# Patient Record
Sex: Female | Born: 1935 | Race: Black or African American | Hispanic: No | Marital: Married | Smoking: Former smoker
Health system: Southern US, Community
[De-identification: ages and names within clinical notes are randomized; demographics above are authoritative.]

## PROBLEM LIST (undated history)

## (undated) DIAGNOSIS — M1990 Unspecified osteoarthritis, unspecified site: Secondary | ICD-10-CM

## (undated) DIAGNOSIS — K5790 Diverticulosis of intestine, part unspecified, without perforation or abscess without bleeding: Secondary | ICD-10-CM

## (undated) DIAGNOSIS — K625 Hemorrhage of anus and rectum: Secondary | ICD-10-CM

## (undated) DIAGNOSIS — K5520 Angiodysplasia of colon without hemorrhage: Secondary | ICD-10-CM

## (undated) DIAGNOSIS — K219 Gastro-esophageal reflux disease without esophagitis: Secondary | ICD-10-CM

## (undated) DIAGNOSIS — K5792 Diverticulitis of intestine, part unspecified, without perforation or abscess without bleeding: Secondary | ICD-10-CM

## (undated) DIAGNOSIS — I10 Essential (primary) hypertension: Secondary | ICD-10-CM

## (undated) DIAGNOSIS — S62109A Fracture of unspecified carpal bone, unspecified wrist, initial encounter for closed fracture: Secondary | ICD-10-CM

## (undated) DIAGNOSIS — F32A Depression, unspecified: Secondary | ICD-10-CM

## (undated) DIAGNOSIS — K648 Other hemorrhoids: Secondary | ICD-10-CM

## (undated) DIAGNOSIS — F329 Major depressive disorder, single episode, unspecified: Secondary | ICD-10-CM

## (undated) DIAGNOSIS — J439 Emphysema, unspecified: Secondary | ICD-10-CM

## (undated) DIAGNOSIS — D649 Anemia, unspecified: Secondary | ICD-10-CM

## (undated) DIAGNOSIS — H269 Unspecified cataract: Secondary | ICD-10-CM

## (undated) DIAGNOSIS — F419 Anxiety disorder, unspecified: Secondary | ICD-10-CM

## (undated) HISTORY — DX: Unspecified cataract: H26.9

## (undated) HISTORY — DX: Fracture of unspecified carpal bone, unspecified wrist, initial encounter for closed fracture: S62.109A

## (undated) HISTORY — PX: OTHER SURGICAL HISTORY: SHX169

---

## 1999-06-20 ENCOUNTER — Encounter: Admission: RE | Admit: 1999-06-20 | Discharge: 1999-09-18 | Payer: Self-pay | Admitting: Orthopaedic Surgery

## 2000-08-27 ENCOUNTER — Emergency Department (HOSPITAL_COMMUNITY): Admission: EM | Admit: 2000-08-27 | Discharge: 2000-08-28 | Payer: Self-pay | Admitting: Emergency Medicine

## 2000-09-03 ENCOUNTER — Emergency Department (HOSPITAL_COMMUNITY): Admission: EM | Admit: 2000-09-03 | Discharge: 2000-09-03 | Payer: Self-pay | Admitting: Emergency Medicine

## 2000-09-04 ENCOUNTER — Encounter: Payer: Self-pay | Admitting: General Surgery

## 2000-09-05 ENCOUNTER — Ambulatory Visit (HOSPITAL_COMMUNITY): Admission: RE | Admit: 2000-09-05 | Discharge: 2000-09-06 | Payer: Self-pay | Admitting: General Surgery

## 2003-04-26 ENCOUNTER — Emergency Department (HOSPITAL_COMMUNITY): Admission: EM | Admit: 2003-04-26 | Discharge: 2003-04-26 | Payer: Self-pay | Admitting: Emergency Medicine

## 2003-07-02 ENCOUNTER — Encounter: Payer: Self-pay | Admitting: Orthopedic Surgery

## 2003-11-04 ENCOUNTER — Ambulatory Visit (HOSPITAL_COMMUNITY): Admission: RE | Admit: 2003-11-04 | Discharge: 2003-11-04 | Payer: Self-pay | Admitting: Orthopedic Surgery

## 2003-11-11 ENCOUNTER — Ambulatory Visit: Payer: Self-pay | Admitting: Orthopedic Surgery

## 2003-11-25 ENCOUNTER — Ambulatory Visit: Payer: Self-pay | Admitting: Orthopedic Surgery

## 2003-11-25 ENCOUNTER — Ambulatory Visit (HOSPITAL_COMMUNITY): Admission: RE | Admit: 2003-11-25 | Discharge: 2003-11-25 | Payer: Self-pay | Admitting: Orthopedic Surgery

## 2003-11-30 ENCOUNTER — Ambulatory Visit: Payer: Self-pay | Admitting: Orthopedic Surgery

## 2003-12-08 ENCOUNTER — Encounter: Admission: RE | Admit: 2003-12-08 | Discharge: 2004-02-02 | Payer: Self-pay | Admitting: Orthopedic Surgery

## 2003-12-21 ENCOUNTER — Ambulatory Visit: Payer: Self-pay | Admitting: Orthopedic Surgery

## 2004-01-27 ENCOUNTER — Ambulatory Visit: Payer: Self-pay | Admitting: Orthopedic Surgery

## 2004-02-29 ENCOUNTER — Ambulatory Visit: Payer: Self-pay | Admitting: Orthopedic Surgery

## 2004-04-29 ENCOUNTER — Ambulatory Visit: Payer: Self-pay | Admitting: *Deleted

## 2004-05-04 ENCOUNTER — Ambulatory Visit: Payer: Self-pay | Admitting: Cardiology

## 2004-05-13 ENCOUNTER — Ambulatory Visit: Payer: Self-pay | Admitting: Cardiology

## 2004-06-02 ENCOUNTER — Ambulatory Visit: Payer: Self-pay | Admitting: Orthopedic Surgery

## 2004-06-22 ENCOUNTER — Ambulatory Visit: Payer: Self-pay | Admitting: Orthopedic Surgery

## 2004-08-25 ENCOUNTER — Ambulatory Visit: Payer: Self-pay | Admitting: Orthopedic Surgery

## 2004-09-23 ENCOUNTER — Encounter: Admission: RE | Admit: 2004-09-23 | Discharge: 2004-09-23 | Payer: Self-pay | Admitting: Orthopedic Surgery

## 2004-10-07 ENCOUNTER — Encounter: Admission: RE | Admit: 2004-10-07 | Discharge: 2004-10-07 | Payer: Self-pay | Admitting: Orthopedic Surgery

## 2004-12-07 ENCOUNTER — Ambulatory Visit: Payer: Self-pay | Admitting: Orthopedic Surgery

## 2004-12-27 ENCOUNTER — Encounter: Payer: Self-pay | Admitting: Orthopedic Surgery

## 2004-12-27 ENCOUNTER — Ambulatory Visit: Payer: Self-pay | Admitting: Orthopedic Surgery

## 2004-12-27 ENCOUNTER — Inpatient Hospital Stay (HOSPITAL_COMMUNITY): Admission: RE | Admit: 2004-12-27 | Discharge: 2004-12-30 | Payer: Self-pay | Admitting: Orthopedic Surgery

## 2005-01-11 ENCOUNTER — Ambulatory Visit: Payer: Self-pay | Admitting: Orthopedic Surgery

## 2005-02-08 ENCOUNTER — Ambulatory Visit: Payer: Self-pay | Admitting: Orthopedic Surgery

## 2005-03-09 ENCOUNTER — Ambulatory Visit: Payer: Self-pay | Admitting: Orthopedic Surgery

## 2005-03-29 ENCOUNTER — Ambulatory Visit: Payer: Self-pay | Admitting: Orthopedic Surgery

## 2005-04-26 ENCOUNTER — Ambulatory Visit: Payer: Self-pay | Admitting: Orthopedic Surgery

## 2005-05-12 ENCOUNTER — Encounter: Admission: RE | Admit: 2005-05-12 | Discharge: 2005-05-12 | Payer: Self-pay | Admitting: Orthopedic Surgery

## 2005-05-26 ENCOUNTER — Encounter: Admission: RE | Admit: 2005-05-26 | Discharge: 2005-05-26 | Payer: Self-pay | Admitting: Orthopedic Surgery

## 2005-07-26 ENCOUNTER — Ambulatory Visit: Payer: Self-pay | Admitting: Orthopedic Surgery

## 2005-08-16 ENCOUNTER — Ambulatory Visit: Payer: Self-pay | Admitting: Orthopedic Surgery

## 2007-10-24 ENCOUNTER — Ambulatory Visit (HOSPITAL_COMMUNITY): Admission: RE | Admit: 2007-10-24 | Discharge: 2007-10-24 | Payer: Self-pay | Admitting: Orthopedic Surgery

## 2008-02-19 ENCOUNTER — Inpatient Hospital Stay (HOSPITAL_COMMUNITY): Admission: RE | Admit: 2008-02-19 | Discharge: 2008-02-22 | Payer: Self-pay | Admitting: Orthopedic Surgery

## 2009-04-09 ENCOUNTER — Encounter (HOSPITAL_COMMUNITY): Admission: RE | Admit: 2009-04-09 | Discharge: 2009-05-09 | Payer: Self-pay | Admitting: Orthopedic Surgery

## 2009-09-02 ENCOUNTER — Inpatient Hospital Stay (HOSPITAL_COMMUNITY): Admission: EM | Admit: 2009-09-02 | Discharge: 2009-09-05 | Payer: Self-pay | Admitting: Emergency Medicine

## 2009-09-03 ENCOUNTER — Ambulatory Visit: Payer: Self-pay | Admitting: Gastroenterology

## 2009-09-04 ENCOUNTER — Ambulatory Visit: Payer: Self-pay | Admitting: Gastroenterology

## 2009-09-04 ENCOUNTER — Telehealth: Payer: Self-pay | Admitting: Gastroenterology

## 2009-09-15 ENCOUNTER — Encounter (INDEPENDENT_AMBULATORY_CARE_PROVIDER_SITE_OTHER): Payer: Self-pay | Admitting: *Deleted

## 2009-09-27 ENCOUNTER — Encounter: Payer: Self-pay | Admitting: Gastroenterology

## 2009-10-09 DIAGNOSIS — K5790 Diverticulosis of intestine, part unspecified, without perforation or abscess without bleeding: Secondary | ICD-10-CM | POA: Diagnosis present

## 2009-10-09 HISTORY — PX: COLONOSCOPY: SHX174

## 2009-10-09 HISTORY — DX: Diverticulosis of intestine, part unspecified, without perforation or abscess without bleeding: K57.90

## 2009-10-12 ENCOUNTER — Ambulatory Visit: Payer: Self-pay | Admitting: Gastroenterology

## 2009-10-12 ENCOUNTER — Ambulatory Visit (HOSPITAL_COMMUNITY): Admission: RE | Admit: 2009-10-12 | Discharge: 2009-10-12 | Payer: Self-pay | Admitting: Gastroenterology

## 2009-12-24 ENCOUNTER — Encounter (INDEPENDENT_AMBULATORY_CARE_PROVIDER_SITE_OTHER): Payer: Self-pay | Admitting: *Deleted

## 2010-01-29 ENCOUNTER — Encounter: Payer: Self-pay | Admitting: Orthopedic Surgery

## 2010-02-08 NOTE — Letter (Signed)
Summary: TRIAGE ORDER  TRIAGE ORDER   Imported By: Ave Filter 09/27/2009 10:16:32  _____________________________________________________________________  External Attachment:    Type:   Image     Comment:   External Document  Appended Document: TRIAGE ORDER TRILYTE PREP.

## 2010-02-08 NOTE — Miscellaneous (Signed)
Summary: CONSULTATION  Clinical Lists Changes  NAME:  [name], [name]             ACCOUNT NO.:  [id number]      MEDICAL RECORD NO.:  [id number]          PATIENT TYPE:  INP      LOCATION:  A202                          FACILITY:  [facility]      PHYSICIAN:  Jonette Eva, M.D.     DATE OF BIRTH:  1928/04/29      DATE OF CONSULTATION:  09/03/2009   DATE OF DISCHARGE:                                    CONSULTATION      REASON FOR CONSULTATION:  Diverticulitis, GI bleeding.      HISTORY OF PRESENT ILLNESS:  [name] is a very pleasant 77 year old   African American female who presented to the emergency department   yesterday with several day history of left lower quadrant abdominal pain   followed by maroon stools.  She states that she has chronic constipation   and takes milk of magnesia intermittently.  She thought that her pain   was associated with constipation.  She took a dose of milk of magnesia   on Sunday.  She states she got the urge to have a bowel movement, but   could tell that she had hard stool present.  Therefore, either Sunday or   Monday she did use an enema.  She states that she may have had some   enema trauma as she did feel a little bit of discomfort.  The following   day she noted that she started passing dark red blood per rectum.  She   states there was no stool present.  She has had several episodes of it   over the course of the last 3-4 days.  She finally notified her family   who brought her to the emergency department yesterday.  On rectal exam   in the ER she had dark red blood per rectum.  No other abnormalities   were seen.  She has had several episodes of blood per rectum since her   admission, but her last stool was brown liquidy in nature.      The patient states she had a colonoscopy in 2006 at [facility]  We are requesting records.  According to the daughter she had   diverticulosis.  She states she has had intermittent lower  abdominal   pain since then, but never was treated for diverticulitis.  She notes   her mother does not go to the doctor unless it is absolutely an   emergency.  Other than the chronic constipation and intermittent GERD,   she denies any dysphagia, nausea, vomiting or melena.      In the emergency department her white count was 6900, hemoglobin 12.8,   INR 1, PTT 32, glucose was slightly elevated at 105, otherwise Met-7   normal.  Her LFTs were normal.  This morning her hemoglobin is down to   11.1.  She did have a CT of the abdomen and pelvis without contrast that   showed extensive diverticulosis of the descending and sigmoid colon with   minimal hazy infiltrative changes  of the pericolic fat at the proximal   sigmoid colon felt to be subtle diverticulitis, she had chronic calcific   pancreatitis of the uncinate process of the pancreas.  She had a right   adrenal myelolipoma.  She also had no evidence of perforation or   abscess.  She had a high density material within the cecum in the distal   small bowel of unclear etiology.      MEDICATIONS AT HOME:   1. Oxycodone p.r.n.   2. Aspirin p.r.n.      ALLERGIES:  CODEINE AND ASPIRIN.      PAST MEDICAL HISTORY:   1. Osteoarthritis.   2. GERD.3. Hyperlipidemia, not on any medication.   4. Chronic calcific pancreatitis noted on CT.   5. Chronic constipation.   6. Chronic intermittent lower abdominal pain.      PAST SURGICAL HISTORY:   1. She had a right total knee arthroplasty revision in February 2010.   2. Right total knee replacement December 2006.   3. Right knee arthroscopy in November 2005.   4. She had a diagnostic laparoscopy with adhesiolysis and       cholecystectomy in 2002.   5. Prior tubal ligation.      FAMILY HISTORY:  Negative for chronic GI illnesses or colorectal cancer.      SOCIAL HISTORY:  Married.  Former smoker, quit 3 years ago.  No alcohol   use.  She has eight children.      REVIEW OF SYSTEMS:  See  HPI for GI.  CONSTITUTIONAL:  Denies weight   loss.  CARDIOPULMONARY:  No chest pain, short of breath, palpitations or   cough.  GENITOURINARY:  No dysuria or hematuria.      PHYSICAL EXAMINATION:  VITAL SIGNS:  Temperature 98.2, pulse 83,   respirations 20, blood pressure 135/77.   GENERAL:  Pleasant elderly black female in no acute distress.   SKIN:  Warm and dry.  No jaundice.   HEENT:  Sclerae nonicteric.  Oropharyngeal mucosa moist and pink.   CHEST:  Lungs are clear to auscultation.   CARDIAC:  Regular rate and rhythm.  No murmurs, rubs, or gallops.   ABDOMEN:  Positive bowel sounds.  Abdomen soft.  She has mild to   moderate left lower quadrant tenderness to deep palpation.  No rebound   or guarding.  No organomegaly or masses.  No abdominal bruits or   hernias.   LOWER EXTREMITIES:  No edema.      LABORATORY DATA AND CT:  As outlined above.      IMPRESSION:  The patient is a pleasant 77 year old lady with chronic   constipation and chronic intermittent abdominal pain who presents with 5   history of left lower quadrant abdominal pain followed by hematochezia   after enema use.  She has subtle hazy infiltrative changes of the   pericolic fat at the proximal sigmoid colon.  Radiologist is favoring   subtle diverticulitis.  Would question this is a possibility plus or   minus enema trauma versus mild ischemic colitis versus other.      RECOMMENDATIONS:   1. Agree with antibiotic coverage.   2. Retrieve old colonoscopy report from [facility]       done in 2006.   3. Will monitor for ongoing bleeding.  Stools are going to be sent for       C diff and culture.  Will follow-up on that.   4. If she has persistent  bleeding, would consider flexible       sigmoidoscopy.  Otherwise consider colonoscopy at a later date.      ADDENDUM 16109: TCS IN 4-6 WEEKS, TRILYTE PREP         Tana Coast, P.A.         ______________________________   Jonette Eva, M.D.          LL/MEDQ  D:  09/03/2009  T:  09/03/2009  Job:  [id number]      cc:   Corinna L. Lendell Caprice, MD      Electronically Signed by Tana Coast P.A. on 09/03/2009 02:14:46 PM   Electronically Signed by Jonette Eva M.D. on 09/06/2009 10:02:36 AM

## 2010-02-08 NOTE — Progress Notes (Signed)
Summary: TCS  Pt admitted with abd pain followed by rectal bleeding. Mos likely has ischemic colitis. TCS in one month, TRILYTE prep. West Bali MD  September 04, 2009 2:56 PM  Appended Document: TCS Made note to call pt mid Sept. for tcs.  Appended Document: TCS I called pt to schedule tcs, no answer,lmom.

## 2010-02-10 NOTE — Letter (Signed)
Summary: Recall Office Visit  [facility]  [address]   [place], [place] [address]   Phone: [phone]  Fax: [phone]      December 24, 2009   [name] [address] [place], [place]  [address] June 27, 1928   Dear [name],   According to our records, it is time for you to schedule a follow-up office visit with [place].   At your convenience, please call [phone] to schedule an office visit. If you have any questions, concerns, or feel that this letter is in error, we would appreciate your call.   Sincerely,    Rexene Alberts  [facility] Ph: [phone]   Fax: [phone]

## 2010-03-24 LAB — DIFFERENTIAL
Basophils Absolute: 0 10*3/uL (ref 0.0–0.1)
Basophils Absolute: 0 10*3/uL (ref 0.0–0.1)
Basophils Absolute: 0.1 10*3/uL (ref 0.0–0.1)
Basophils Relative: 0 % (ref 0–1)
Basophils Relative: 1 % (ref 0–1)
Eosinophils Absolute: 0 10*3/uL (ref 0.0–0.7)
Eosinophils Absolute: 0.1 10*3/uL (ref 0.0–0.7)
Eosinophils Absolute: 0.1 10*3/uL (ref 0.0–0.7)
Eosinophils Relative: 1 % (ref 0–5)
Eosinophils Relative: 2 % (ref 0–5)
Lymphocytes Relative: 23 % (ref 12–46)
Lymphs Abs: 1.2 10*3/uL (ref 0.7–4.0)
Lymphs Abs: 1.4 10*3/uL (ref 0.7–4.0)
Monocytes Absolute: 0.5 10*3/uL (ref 0.1–1.0)
Monocytes Absolute: 0.5 10*3/uL (ref 0.1–1.0)
Monocytes Relative: 8 % (ref 3–12)
Neutro Abs: 4.9 10*3/uL (ref 1.7–7.7)
Neutrophils Relative %: 67 % (ref 43–77)

## 2010-03-24 LAB — CLOSTRIDIUM DIFFICILE EIA
C difficile Toxins A+B, EIA: NEGATIVE
C difficile Toxins A+B, EIA: NEGATIVE

## 2010-03-24 LAB — CBC
HCT: 33.3 % — ABNORMAL LOW (ref 36.0–46.0)
Hemoglobin: 11.3 g/dL — ABNORMAL LOW (ref 12.0–15.0)
Hemoglobin: 12.8 g/dL (ref 12.0–15.0)
MCH: 29.8 pg (ref 26.0–34.0)
MCH: 30.1 pg (ref 26.0–34.0)
MCH: 30.4 pg (ref 26.0–34.0)
MCHC: 32.7 g/dL (ref 30.0–36.0)
MCHC: 32.8 g/dL (ref 30.0–36.0)
MCV: 91.1 fL (ref 78.0–100.0)
MCV: 91.9 fL (ref 78.0–100.0)
RBC: 3.79 MIL/uL — ABNORMAL LOW (ref 3.87–5.11)
RBC: 4.2 MIL/uL (ref 3.87–5.11)
RDW: 13.1 % (ref 11.5–15.5)
RDW: 13.2 % (ref 11.5–15.5)
RDW: 13.6 % (ref 11.5–15.5)
WBC: 6.9 10*3/uL (ref 4.0–10.5)
WBC: 6.9 10*3/uL (ref 4.0–10.5)

## 2010-03-24 LAB — BASIC METABOLIC PANEL
BUN: 3 mg/dL — ABNORMAL LOW (ref 6–23)
BUN: 9 mg/dL (ref 6–23)
CO2: 26 mEq/L (ref 19–32)
Calcium: 8.9 mg/dL (ref 8.4–10.5)
Calcium: 9.1 mg/dL (ref 8.4–10.5)
Chloride: 106 mEq/L (ref 96–112)
Creatinine, Ser: 0.77 mg/dL (ref 0.4–1.2)
Creatinine, Ser: 0.77 mg/dL (ref 0.4–1.2)
GFR calc Af Amer: >60 mL/min (ref >60)
GFR calc non Af Amer: >60 mL/min (ref >60)
Glucose, Bld: 101 mg/dL — ABNORMAL HIGH (ref 70–99)
Glucose, Bld: 105 mg/dL — ABNORMAL HIGH (ref 70–99)
Glucose, Bld: 93 mg/dL (ref 70–99)
Potassium: 3.6 mEq/L (ref 3.5–5.1)
Sodium: 140 mEq/L (ref 135–145)

## 2010-03-24 LAB — HEPATIC FUNCTION PANEL
Indirect Bilirubin: 0.5 mg/dL (ref 0.3–0.9)
Total Bilirubin: 0.6 mg/dL (ref 0.3–1.2)
Total Protein: 6.6 g/dL (ref 6.0–8.3)

## 2010-03-24 LAB — TYPE AND SCREEN

## 2010-03-24 LAB — PROTIME-INR: Prothrombin Time: 13.4 seconds (ref 11.6–15.2)

## 2010-03-24 LAB — APTT: aPTT: 32 seconds (ref 24–37)

## 2010-03-24 LAB — STOOL CULTURE

## 2010-04-26 LAB — BASIC METABOLIC PANEL
BUN: 3 mg/dL — ABNORMAL LOW (ref 6–23)
CO2: 24 mEq/L (ref 19–32)
CO2: 27 mEq/L (ref 19–32)
Chloride: 101 mEq/L (ref 96–112)
Chloride: 105 mEq/L (ref 96–112)
Creatinine, Ser: 0.72 mg/dL (ref 0.4–1.2)
Glucose, Bld: 176 mg/dL — ABNORMAL HIGH (ref 70–99)
Potassium: 3.7 mEq/L (ref 3.5–5.1)
Sodium: 132 mEq/L — ABNORMAL LOW (ref 135–145)

## 2010-04-26 LAB — COMPREHENSIVE METABOLIC PANEL
Albumin: 4.3 g/dL (ref 3.5–5.2)
Alkaline Phosphatase: 73 U/L (ref 39–117)
BUN: 8 mg/dL (ref 6–23)
Potassium: 4.3 mEq/L (ref 3.5–5.1)
Sodium: 142 mEq/L (ref 135–145)
Total Protein: 7.3 g/dL (ref 6.0–8.3)

## 2010-04-26 LAB — URINALYSIS, ROUTINE W REFLEX MICROSCOPIC
Bilirubin Urine: NEGATIVE
Ketones, ur: NEGATIVE mg/dL
Nitrite: NEGATIVE
Specific Gravity, Urine: 1.016 (ref 1.005–1.030)
Urobilinogen, UA: 0.2 mg/dL (ref 0.0–1.0)

## 2010-04-26 LAB — CBC
HCT: 32.7 % — ABNORMAL LOW (ref 36.0–46.0)
HCT: 35.4 % — ABNORMAL LOW (ref 36.0–46.0)
HCT: 43.2 % (ref 36.0–46.0)
Hemoglobin: 11.1 g/dL — ABNORMAL LOW (ref 12.0–15.0)
Hemoglobin: 12 g/dL (ref 12.0–15.0)
MCHC: 33.7 g/dL (ref 30.0–36.0)
MCHC: 33.8 g/dL (ref 30.0–36.0)
MCHC: 34.3 g/dL (ref 30.0–36.0)
MCV: 93.2 fL (ref 78.0–100.0)
MCV: 94.2 fL (ref 78.0–100.0)
Platelets: 229 10*3/uL (ref 150–400)
Platelets: 335 10*3/uL (ref 150–400)
RBC: 3.47 MIL/uL — ABNORMAL LOW (ref 3.87–5.11)
RDW: 13.4 % (ref 11.5–15.5)
RDW: 13.6 % (ref 11.5–15.5)
RDW: 13.8 % (ref 11.5–15.5)

## 2010-04-26 LAB — PROTIME-INR
INR: 1 (ref 0.00–1.49)
INR: 2 — ABNORMAL HIGH (ref 0.00–1.49)
Prothrombin Time: 23.8 seconds — ABNORMAL HIGH (ref 11.6–15.2)

## 2010-04-26 LAB — TYPE AND SCREEN: Antibody Screen: NEGATIVE

## 2010-04-26 LAB — APTT: aPTT: 32 seconds (ref 24–37)

## 2010-05-24 NOTE — Op Note (Signed)
NAMEMarland Kitchen  [name], [name]             ACCOUNT NO.:  [id number]   MEDICAL RECORD NO.:  [id number]          PATIENT TYPE:  INP   LOCATION:  0012                         FACILITY:  [facility]   PHYSICIAN:  Ollen Gross, M.D.    DATE OF BIRTH:  May 11, 1928   DATE OF PROCEDURE:  02/19/2008  DATE OF DISCHARGE:                               OPERATIVE REPORT   PREOPERATIVE DIAGNOSIS:  Loose right total knee arthroplasty.   POSTOPERATIVE DIAGNOSIS:  Loose right total knee arthroplasty.   PROCEDURE:  Right total knee arthroplasty revision.   SURGEON:  Dr. Lequita Halt.   ASSISTANT:  Avel Peace, P.A.-C.   ANESTHESIA:  Spinal.   ESTIMATED BLOOD LOSS:  Minimal.   DRAINS:  None.   TOURNIQUET TIME:  Up 35 at 300 mmHg, down 10, and up additional 15 at  300 mmHg.   COMPLICATIONS:  None.   CONDITION:  Stable to recovery.   BRIEF CLINICAL NOTE:  [name] is a 77 year old female WHO had a  right total knee arthroplasty done elsewhere about 2-3 years ago.  She  has had progressively worsening pain and dysfunction in the knee.  She  had an unstable feeling to the knee.  Plain radiographs looked normal,  but we did obtain a bone scan which showed loosening of the prosthesis.  Given her progressively increasing pain and dysfunction, it is decided  to pursue revision surgery.   PROCEDURE IN DETAIL:  After successful administration of spinal  anesthetic, a tourniquet was placed high on the right thigh, and I did  exam under anesthesia.  She had very significant laxity in flexion.  With full extension, she was stable.  We then prepped and draped the  right lower extremity in the normal sterile fashion.  The extremity was  wrapped in Esmarch, knee flexed and tourniquet inflated to 300 mmHg.  Midline incision was made with a 10 blade through the subcutaneous  tissue to the extensor mechanism.  A fresh blade was used make a medial  parapatellar arthrotomy.  I did not encounter any fluid.  Soft tissue  over the proximal medial tibia was then subperiosteally elevated around  the joint line with the knife and into the semimembranosus bursa with a  Cobb elevator.  Soft tissue laterally was elevated with attention being  paid to avoiding the patellar tendon on tibial tubercle.  I was unable  to evert the patella.  I removed scar tissue from the medial and lateral  gutters.  I was then able to subluxed the tibia and then removed the  tibial polyethylene.  This was a 12.5-mm polyethylene thickness.  I then  disrupted the interface between the femoral component bone using  osteotomes and easily removed the femoral component.  Femoral component  was not grossly loose but was not very difficult to remove either.  We  did not sustain any bone loss upon removing it.  I then subluxed tibia  forward and disrupted the interface between the tibial component and  bone anteriorly using an oscillating saw.  I then used an osteotome, and  the entire component flipped out very easily consistent  with a loose  tibial component.  Inspecting posteriorly did show that was subsiding  into the tibia posteriorly.  We then removed the cement from the tibial  canal.   Both canals, tibial and femoral, thoroughly irrigated with saline  solution.  I reamed on the femoral side up to 16 mm for a 60-mm Press-  Fit stem and on the tibial side up to 13 mm for a 13-mm cemented stem.  The tibia was then subluxed forward, and the extramedullary tibial  alignment guide was placed.  We referenced proximally to the medial  aspect of the tibial tubercle and distally along the second metatarsal  axis of the tibial crest.  I removed approximately 2-3 mm to get under  the cement to get to a good bony base.  The resection was made with an  oscillating saw.  Size 2.5 was the most appropriate tibial component.  We then prepared proximally with the modular drill and the drill with  the stem attachment on it to prepare for the MBT  revision tibia.  Once  this was completed, we did our keel punch with a size 3 and then  directed attention to the femoral side.   The femoral canal was again irrigated and a 16-mm reamer placed for  intramedullary cutting guide.  The 5-degree right valgus alignment guide  was placed to remove about 2 mm from the distal femur.  On the medial  side, this was a flush cut.  On the lateral side, we had to go up to the  +4 position in order to get to good bone.  We thus needed a +4 mm distal  augment laterally.  We then placed the AP block in the +2 position to  effectively raise the stem and lower the component to the anterior  flange of the femur.  The anterior and posterior cuts are made.  We  needed a 4-mm augment posterior medial.  The intercondylar blocks were  then placed to make our intercondylar cut.   The trials were placed, size 2.5 MBT revision tibial tray.  On the  femoral side, we used a size 3 and utilized a trial of the size 3 TC3  femur with a 4 mm distal lateral augment, 4 mm posterior medial augment  and a 16 x 75 stem extension in a +2 position.  We had a fairly large  flexion extension gap, and it went over 20 mm with the insert to gain  instability.  I felt that we could do better by building up the joint  line and using a less thick insert.  We thus placed 10-mm augments on  the medial and lateral side of the tibial component.  With this  construct and a 12.5-mm insert, we had great stability achieving full  extension with excellent varus-valgus and anterior-posterior stability  throughout full range of motion.  We then released the tourniquet for  about 8 minutes and then inspected the knee, and there was no active  bleeding points of significance.  Moist sponge was placed while we  assembled the components on the back table.  After approximately 8  minutes, we rewrapped leg in Esmarch.  The components were already  assembled at that point.  I trialed for a cement  restricter in the  tibia, and size 4 was most appropriate.  A size 4 cement restricter was  then placed at the appropriate depth in the tibial canal.  The bone  surfaces were then prepared with  pulsatile lavage.  During this time, I  also did a patellaplasty effectively removing all the soft tissue from  around the patellar component so would be a direct articulation between  the component and the trochlea as opposed to having any tissue  articulating.  The cement was then mixed and once ready for implantation  was injected into the tibial canal and pressurized.  We used 3 g of  vancomycin in total with 3 bags of cement.  The tibia was cemented in  place and impacted.  The femoral side was cemented distally with a Press-  Fit stem.  A 12.5 trial insert was placed, knee held in full extension,  and all extruded cement removed.  When the cement was fully hardened,  then the permanent 12.5-mm rotating platform TC3 insert was placed into  the tibial tray.  This was reduced with excellent stability throughout  full range of motion.  Wound was then copiously irrigated with saline  solution, and the FloSeal injected on the posterior capsule, medial and  lateral gutters and suprapatellar area.  Moist sponge was placed and  tourniquet released for a second tourniquet time approximately 15  minutes.  The sponge was held for 2 minutes and then removed.  There was  minimal bleeding.  The bleeding that was encountered was stopped with  electrocautery.  The wound was then again irrigated and the arthrotomy  closed with interrupted #1 PDS.  Flexion  against gravity was about 135 degrees with excellent stability  throughout full range of motion.  Subcutaneous tissue was closed with  interrupted 2-0 Vicryl and skin with staples.  Incisions cleaned and  dried, and a bulky sterile dressing applied.  She was placed into a knee  immobilizer, awakened, transported to recovery in stable condition.       Ollen Gross, M.D.  Electronically Signed     FA/MEDQ  D:  02/19/2008  T:  02/19/2008  Job:  [id number]

## 2010-05-27 NOTE — H&P (Signed)
[place]  Patient:    [name], [name] Visit Number: [id number] MRN: [id number]          Service Type: Attending:  Jimmye Norman, M.D. Dictated by:   Jimmye Norman, M.D. Adm. Date:  09/05/00   CC:         Tammy R. Collins [place], M.D.   History and Physical  DATE OF BIRTH: 13-Jul-1928  IDENTIFICATION/CHIEF COMPLAINT: The patient is a 77 year old female, with symptomatic cholelithiasis.  HISTORY OF PRESENT ILLNESS: I first saw [name] on September 03, 2000.  At that time she had been suffering from severe right upper quadrant and epigastric pain associated with meals.  This has been going on for the past three to four weeks.  She also has some pain in her lower quadrants and also some that extends down into her lower extremities, with weakness and tingling. This did not appear to be related to her gallbladder; however, possibly secondary to some back disease.  Her epigastric and right upper quadrant pain, however, did seem to be related to her gallbladder.  She had an ultrasound performed at an outside radiology facility which demonstrated difficulty visualizing the gallbladder, which was isoechoic, but gallstones with stones contained within.  A CT scan was recommended; however, this is still pending.  PAST MEDICAL HISTORY: Pretty much unremarkable.  She has had no previous hospitalizations.  PAST SURGICAL HISTORY:  1. Tubal ligation.  2. Laparoscopic adhesiolysis done in 1995.  ALLERGIES: She is intolerance to and has a rash with CODEINE.  MEDICATIONS: None.  REVIEW OF SYSTEMS: She has had no diarrhea, jaundice, constipation.  No fever or chills.  No chest pain or shortness of breath with exertion.  PHYSICAL EXAMINATION:  VITAL SIGNS: She is afebrile.  Other vital signs are stable.  HEENT: Normocephalic, atraumatic.  Anicteric.  NECK: Supple.  CHEST: Clear.  CARDIAC: No murmurs, gallops, lifts, or heaves.  ABDOMEN: Soft,  with mild tenderness in the right upper quadrant and the epigastrium.  PELVIC/RECTAL: Examinations deferred.  LABORATORY DATA: Studies all pending.  Ultrasound report is as mentioned previously, done on August 28, 2000.  IMPRESSION: Symptomatic cholelithiasis with contracted small gallbladder.  PLAN: Perform laparoscopic evaluation and cholecystectomy.  This will be done as an outpatient with overnight stay. Dictated by:   Jimmye Norman, M.D. Attending:  Jimmye Norman, M.D. DD:  09/05/00 TD:  09/05/00 Job: [id number]

## 2010-05-27 NOTE — H&P (Signed)
NAME:  [name], [name] NO.:  [id number]   MEDICAL RECORD NO.:  [id number]          PATIENT TYPE:  OUT   LOCATION:  RAD                           FACILITY:  [facility]   PHYSICIAN:  Vickki Hearing, M.D.DATE OF BIRTH:  1928-04-05   DATE OF ADMISSION:  11/04/2003  DATE OF DISCHARGE:  10/26/2005LH                                HISTORY & PHYSICAL   Followup and pre-op regarding right knee.  Followup after MRI right knee.  I  reviewed the MRI, see below.   CHIEF COMPLAINT:  Right knee pain.   HISTORY AND PHYSICAL IN PREPARATION FOR SURGERY:  This is a 77 year old  female with 3-4 years of right knee pain.  Initially evaluation [employer] with x-rays and MRI.  She had a torn medial meniscus and  osteoarthritis.  She was seen in this office in June 2005.  I agree with the  assessment; however, the patient was not willing to undergo surgery at that  time and wanted to have an injection and that was given.  That lasted her  about 3-4 months, and she continued to have pain with the right knee going  out with increased medial compartment symptoms.  A repeat MRI was done and  it showed that she has a torn medial meniscus, a partial tear of her ACL,  and a possible tear of the lateral meniscus.  She has agreed to undergo  surgery.  We discussed her postoperative course and rehabilitation.  She  agrees and understands the risks and benefits of the procedure.   REVIEW OF SYSTEMS:  Indicates she has had a history of reflux, unsteady  gait, joint pain, otherwise her systems are normal.   She is allergic to CODEINE.   PAST MEDICAL HISTORY:  Includes no major medical problems.   She did have a cholecystectomy.   Her medications are filled at [facility] in [place].  She is on no medications.  She is healthy.   FAMILY HISTORY:  Asthma and cancer.   FAMILY PHYSICIAN:  Dr. Dewaine Conger.   SOCIAL HISTORY:  She is married.  She is a housewife.  She does smoke a pack  of  cigarettes every 1-2 days.  She reports no alcohol use.  Caffeine use  reported yes.  Grade level completed 1-8.   PHYSICAL EXAMINATION:  VITAL SIGNS:  Weight 140, pulse 78, respiratory rate  20.  GENERAL APPEARANCE:  Normal.  CARDIOVASCULAR:  No swelling, no varicose veins, pulses are intact.  EXTREMITIES:  Temperature of extremities normal.  No edema or tenderness.  LYMPH NODES:  Normal.  MUSCULOSKELETAL:  Shows an antalgic gait on the right leg.  Her right knee  has normal alignment.  There is medial compartment tenderness.  The range of  motion was full.  The stability was normal.  The strength and muscle tone  were intact.  Muscle strength grade 5/5.  Meniscal signs were positive.  We  did a screw-home test and McMurray maneuver.  The Apley test was also  positive.  SKIN:  Normal in all four extremities.  NEURO/PSYCH:  Showed normal coordination, reflexes, and  sensation.  Reflexes  were equal right to left.  She was oriented to person, place and time.  Her  mood and affect were normal.   Her MRI was reviewed and shows a torn medial meniscal tear, ACL partial  tear, and lateral meniscal free edge tear.   IMPRESSION:  1.  Medial meniscal tear.  2.  Anterior cruciate ligament tear, partial.  3.  Possible lateral meniscal degenerative tear.   PLAN:  Arthroscopy, right knee.     Weyman Croon   SEH/MEDQ  D:  11/11/2003  T:  11/11/2003  Job:  [id number]   cc:   [facility]  Fax: [phone]   Vickki Hearing, M.D.  Fax: [phone]

## 2010-05-27 NOTE — Op Note (Signed)
NAME:  [name], [name]             ACCOUNT NO.:  [id number]   MEDICAL RECORD NO.:  [id number]          PATIENT TYPE:  INP   LOCATION:  A340                          FACILITY:  [facility]   PHYSICIAN:  Vickki Hearing, M.D.DATE OF BIRTH:  [birth year]   DATE OF PROCEDURE:  12/27/2004  DATE OF DISCHARGE:                                 OPERATIVE REPORT   PREOPERATIVE DIAGNOSIS:  Osteoarthritis, right knee.   POSTOPERATIVE DIAGNOSIS:  Osteoarthritis, right knee.   PROCEDURE:  Right total knee replacement.   IMPLANTS USED:  DePuy rotating platform size 3 femur, 2.5 tibia, 32 patella  and a 12.5 insert.   OPERATIVE FINDINGS:  Grade 4 lesion medial femoral condyle; grade 2 lesion  lateral femoral condyle, grade 1 lesion patellofemoral joint including  trochlea.   SURGEON:  Dr. Romeo Apple.   ANESTHESIA:  Anesthetic was by spinal.   SPECIMENS:  Specimens included bone fragments.   DRAINS:  One drain was in the knee joint. One pain pump catheter was in the  subcu.   The patient was identified as [name]. Her right leg was marked for  surgery, countersigned by the surgeon. History and physical were updated.  Antibiotics were started. She was taken to the operating for spinal  anesthetic.   After spinal anesthetic, Foley catheter was in place. Right leg was prepped  and draped using sterile technique. Time-out was completed as required.  Right lower extremity was then exsanguinated with a 6-inch Esmarch.  Tourniquet was elevated.   Straight midline incision was used. Medial arthrotomy was performed. Medial  soft tissue sleeve was elevated. The tibia was subluxated forward. Neutral  cut was made for the tibia, resecting 8 mm of bone from the normal side.   Femur was entered with a 3/8-inch drill bit. Femur was decompressed with  suction irrigation. Distal femoral cut was made referencing the most  prominent condyle, resecting 10 mm of bone. Extension gap was checked. We  were  able to get a 12.5 spacer in. We then turned our attention to the 4 in  and 1 cutting block. It was applied to the distal femur. Four cuts were  made. Flexion gap was checked. That match our extension gap, and we  proceeded to make the box cut. We did a trial reduction. Trial reduction was  good with good stability with a 12.5 insert. Excellent range of motion was  noted.   Of note, soft tissue resections included ACL, PCL, medial and lateral  menisci.   Patella was cut from a 26 down to a 15 with three pegs drilled through 32-mm  button.   We irrigated the bone, dried all the bones, cemented the implants in place,  allowed them to cure and closed by placing 30 cc of Marcaine with  epinephrine in the soft tissues around the intra-articular portion of the  joint. Extensor mechanism was closed over a Hemovac drain with #1 Bralon  interrupted in running fashion and closed the subcu with 0-0 and 2-0  Monocryl over a pain pump catheter, placed staples in the skin, applied  dressings, CryoCuff, Ace bandages. The patient  was taken to recovery room in  stable condition. Postoperative, total knee protocol as routine.      Vickki Hearing, M.D.  Electronically Signed     SEH/MEDQ  D:  12/28/2004  T:  12/29/2004  Job:  [id number]

## 2010-05-27 NOTE — Discharge Summary (Signed)
NAMEMarland Kitchen  [name], [name]             ACCOUNT NO.:  [id number]   MEDICAL RECORD NO.:  [id number]          PATIENT TYPE:  INP   LOCATION:  1615                         FACILITY:  [facility]   PHYSICIAN:  Ollen Gross, M.D.    DATE OF BIRTH:  10-24-1928   DATE OF ADMISSION:  02/19/2008  DATE OF DISCHARGE:  02/22/2008                               DISCHARGE SUMMARY   ADMISSION DIAGNOSES:  1. Painful right total knee.  2. Vertigo.  3. Emphysema.  4. Hypercholesterolemia.  5. Reflux disease.   DISCHARGE DIAGNOSES:  1. Loose right total knee arthroplasty, status post right total knee      arthroplasty revision.  2. Mild postoperative blood loss anemia.  3. Mild postoperative hyponatremia.  4. Vertigo.  5. Emphysema.  6. Hypercholesterolemia.  7. Reflux disease.   PROCEDURE:  On 02/19/08, right total knee arthroplasty revision.   SURGEON:  Ollen Gross, M.D.   ASSISTANT:  Alexzandrew L. Perkins, P.A.-C.   ANESTHESIA:  Spinal.   CONSULTATIONS:  None.   BRIEF HISTORY:  [name] is a 77 year old female who underwent a  right total knee arthroplasty elsewhere about two or three years ago,  with progressive worsening pain and dysfunction with continued pain.  X-  rays looked normal.  A bone scan showed loosening and she was felt to  benefit from undergoing a revision.   LABORATORY DATA:  Preop CBC showed a hemoglobin of 14.3, hematocrit  43.2, white cell count 5.8, platelets 335.  Postoperative hemoglobin 12,  then down to 11.  Last hemoglobin and hematocrit stabilized with a  hemoglobin of 11.1 and hematocrit of 32.3.  PT 13.4, PTT 32.  INR 1.0.  Serial pro-times followed per the Coumadin protocol.  Last PT and INR  23.9 and 2.0 respectively.  A chemistry panel on admission all within  normal limits.  Serial BMETs were followed.  Sodium did drop from 142 to  132, back up to 137.  Glucose went from 93 to 176 and back down to 111.  Preoperative urinalysis with moderate leukocyte  esterase and rare  epithelial cells, rare bacteria.  Blood type O-positive.   A chest x-ray dated 05/16/07, revealed chronic obstructive pulmonary  disease and emphysema.  No acute cardiopulmonary process.   She has an electrocardiogram on the chart dated 05/16/07, showing sinus  rhythm, T-wave abnormality in the high lateral leads, prolonged QT  interval, unconfirmed.   HOSPITAL COURSE:  The patient admitted to [facility] and taken  to the operating room and underwent the above-stated procedure without  complications.  The patient tolerated the procedure well.  The patient  was started on PCA for pain control.  Received 24 hours postop IV  antibiotics.  Had a low sodium postop on day one.  Hemoglobin was fine.  Doing pretty well for a full revision.  Started on meds.  Felt to be a  delusional component with hyponatremia.  Decreased the fluids.  Her  output was good.  By day two the sodium was already back up.  The  hyponatremia improved.  She started getting up out of bed and walking  short distances.  She got about 25 feet.  Dressing change and the  incision looked excellent.  Continue to progress well and by day three,  walking 100 feet.  Tolerating medications.   DISPOSITION:  Was discharged home later that day.   DISCHARGE PLAN:  Discharged home on 02/22/08.   DISCHARGE MEDICATIONS:  1. Percocet.  2. Robaxin.  3. Coumadin.   DISCHARGE DIET:  Low-cholesterol diet.   DISCHARGE ACTIVITIES:  As tolerated.  Total knee protocol.  Home PT and  nursing.   FOLLOWUP:  Follow up in two weeks in the office.   CONDITION ON DISCHARGE:  Improved.      Alexzandrew L. Perkins, P.A.C.      Ollen Gross, M.D.  Electronically Signed    ALP/MEDQ  D:  04/02/2008  T:  04/02/2008  Job:  [id number]   cc:   Dr. Mamie Laurel

## 2010-05-27 NOTE — Op Note (Signed)
NAME:  [name], [name]             ACCOUNT NO.:  [id number]   MEDICAL RECORD NO.:  [id number]          PATIENT TYPE:  AMB   LOCATION:  DAY                           FACILITY:  [facility]   PHYSICIAN:  Vickki Hearing, M.D.DATE OF BIRTH:  Sep 28, 1928   DATE OF PROCEDURE:  11/25/2003  DATE OF DISCHARGE:                                 OPERATIVE REPORT   PREOPERATIVE DIAGNOSIS:  Torn medial meniscus, torn lateral meniscus, torn  anterior cruciate ligament, and osteoarthritis, right knee.   POSTOPERATIVE DIAGNOSIS:  Torn medial and lateral meniscus, osteoarthritis  of the right knee.   PROCEDURE:  Arthroscopy, medial and lateral meniscectomies, and  chondroplasty, medial femoral condyle and lateral femoral condyle.   OPERATIVE FINDINGS:  Grade 3 cartilage changes on the lateral and medial  femoral condyles.  Posterior horn medial meniscal tear.  Degenerative  fraying of the free edge of the lateral meniscus.   COMPLICATIONS:  None.   SPECIMENS:  None.   COUNTS:  Correct.   ANESTHESIA:  Spinal.   SURGEON:  Vickki Hearing, M.D.   ASSISTANT:  None.   SPECIMENS:  None.   DRAINS:  None.   HISTORY:  This is a 77 year old female with a long history of right knee  pain, who presented to the office with torn medial meniscus on MRI.  In  addition, she had lateral meniscal tear and possible ACL tear documented by  MRI.  When I initially saw her, she did not want to have surgery.  She tried  to put up with the symptoms.  However, the pain increased, her activities of  daily living became more difficult, and she presented for a right knee  arthroscopy.  Primary indication of pain.   PROCEDURE:  The patient was identified as [name] in the preop  holding area.  She marked her right knee as the surgical site, at which time  I then signed my initials on the surgical site as the right knee as well.  We reviewed her medical record and consent, and they confirmed that the  right knee was the surgical site.   She was to have right knee arthroscopy.  She had a gram of Ancef and was  taken to the operating room for a spinal anesthetic.  She was placed supine  on the operating table.  Her right knee was prepped and draped using sterile  technique.  We then took a mandatory time-out as required to confirm the  procedure, the patient, and the proper limb as the right lower extremity.  Everyone in the OR agreed.  We confirmed her antibiotics have been given  within an hour of the skin incision, all equipment was present and  functioning.   A two-incision diagnostic arthroscopy was performed starting in the medial  compartment.  We progressed around the notch, the lateral compartment, and  the patellofemoral compartment.   In the medial compartment there were grade 3 changes of the medial femoral  condyle.  There was a torn posterior horn medial meniscus.  The ACL was  intact.  There was fraying and tearing of the free  edge of the lateral  meniscus and grade 3 changes there as well.  The patellofemoral joint was  normal.   Using the medial portal as a working portal, a duckbill forceps was used to  trim the meniscal tear on the medial side.  A chondroplasty was performed.  The fragments and debris were suctioned out of the joint using a shaver.  I  then did a similar procedure on the lateral side and irrigated the knee,  suctioned the debris, closed the portals with Steri-Strips, injected 30 mL  of Marcaine, and put sterile dressings on the knee, a CryoCuff.  The patient  was taken to the recovery room in stable condition.  Discharged home on  Darvocet, full weightbearing, follow-up in two days.     [name]   SEH/MEDQ  D:  11/25/2003  T:  11/25/2003  Job:  [id number]

## 2010-05-27 NOTE — H&P (Signed)
NAME:  [name], [name]             ACCOUNT NO.:  [id number]   MEDICAL RECORD NO.:  [id number]          PATIENT TYPE:  AMB   LOCATION:  DAY                           FACILITY:  [facility]   PHYSICIAN:  Vickki Hearing, M.D.DATE OF BIRTH:  August 30, 1928   DATE OF ADMISSION:  12/26/2004  DATE OF DISCHARGE:  LH                                HISTORY & PHYSICAL   CHIEF COMPLAINT:  Right knee pain.   HISTORY OF PRESENT ILLNESS:  This is a 77 year old female who is status post  arthroscopy, right knee, at which time we found osteoarthritis and torn  meniscus.  She did not recover well after her surgery and continued to have  pain.  She was worked up for neurogenic pain and also had an MRI done of her  spine.  Spine MRI showed foraminal encroachment on the right at L4 and L5.  She did have epidural steroid series with only minimal improvement and  continues to have right knee pain and presents for a right total knee.   REVIEW OF SYSTEMS:  GASTROINTESTINAL:  History of reflux.  MUSCULOSKELETAL:  Joint pain.  Otherwise normal.   ALLERGIES:  CODEINE.   PAST MEDICAL HISTORY:  No major medical problems.   PAST SURGICAL HISTORY:  1.  Cholecystectomy.  2.  Knee arthroscopy.   CURRENT MEDICATIONS:  No chronic medications.  She is healthy.   FAMILY HISTORY:  Asthma and cancer.   PRIMARY CARE PHYSICIAN:  Dr. Dewaine Conger.   SOCIAL HISTORY:  She is married.  She is a housewife.  She does smoke one to  two cigarettes every day.  No alcohol use.  She has reported some caffeine  use and she completed grade levels 1-8.   PHYSICAL EXAMINATION:  VITAL SIGNS:  Weight is approximately 140 pounds,  pulse 80, respirations 18.  GENERAL:  Normal.  CARDIAC:  No swelling, varicose veins and impulses are normal.  EXTREMITIES:  Warm to touch with no edema or tenderness.  LYMPH NODES:  Normal.  SKIN:  Normal on all four extremities.  NEUROLOGIC:  She is awake, alert and oriented with normal coordination,  reflexes  and sensation.  EXTREMITIES:  She has a painful range of motion throughout the range of  motion arc.  She does have near full range of motion in the knee.  There are  no major contractures.  Muscle tone and strength are good.  The knee is  stable.  Primary pain was in the lateral compartment.   ASSESSMENT:  Osteoarthritis, right knee.   PLAN:  Right total knee.      Vickki Hearing, M.D.  Electronically Signed     SEH/MEDQ  D:  12/26/2004  T:  12/26/2004  Job:  [id number]

## 2010-05-27 NOTE — Discharge Summary (Signed)
NAME:  [name], [name]             ACCOUNT NO.:  [id number]   MEDICAL RECORD NO.:  [id number]          PATIENT TYPE:  INP   LOCATION:  A340                          FACILITY:  [facility]   PHYSICIAN:  Vickki Hearing, M.D.DATE OF BIRTH:  29-Aug-1928   DATE OF ADMISSION:  12/27/2004  DATE OF DISCHARGE:  12/22/2006LH                                 DISCHARGE SUMMARY   ADMISSION DIAGNOSIS:  Osteoarthritis right knee.   DISCHARGE DIAGNOSIS:  Osteoarthritis right knee.   PROCEDURES:  Right total knee implant, DePuy rotating platform.   SURGEON:  Vickki Hearing, M.D.   HOSPITAL COURSE:  The patient was admitted on the day stated, had an  uncomplicated knee surgery with a right total knee replacement.  She did  well except for some late night confusion.  On the day of discharge, she had  a low-grade temperature of 99.1.  Her wound looked fine with no erythema.  Her vital signs were stable.  She was awake, alert and oriented x3.  I spoke  with her daughter and with the patient, and they both agreed the patient was  ready to go home.  She was ambulatory 100 feet, had range of motion 0-90.   She was discharged on routine medications.  In addition, she will take one  Vicodin as needed for pain.  She is on Lovenox 30 mg b.i.d. x10 days  followed by Ecotrin, one b.i.d. x4 weeks after the Lovenox is stopped.  She  is weightbearing as tolerated.  She has physical therapy and CPM set up for  home.  She will follow up with me on January 3 at 9:30.  We will take the  staples out if they are ready at that time.      Vickki Hearing, M.D.  Electronically Signed     SEH/MEDQ  D:  12/30/2004  T:  12/30/2004  Job:  [id number]

## 2010-05-27 NOTE — Op Note (Signed)
[place]. [facility]  Patient:    [name], [name] Visit Number: [id number] MRN: [id number]          Service Type: DSU Location: [phone] Attending Physician:  Cherylynn Ridges Proc. Date: 09/05/00 Adm. Date:  09/05/2000                             Operative Report  PREOPERATIVE DIAGNOSIS:  Symptomatic cholelithiasis based on ultrasound report.  POSTOPERATIVE DIAGNOSIS:  Status post surgical removal of gallbladder with adhesions in gallbladder fossa and pelvic adhesions.  PROCEDURE:  Diagnostic laparoscopy with adhesiolysis.  SURGEON:  Jimmye Norman, M.D.  ASSISTANT:  Ollen Gross. Vernell Morgans, M.D.  ANESTHESIA:  General endotracheal.  ESTIMATED BLOOD LOSS:  Less than 10 cc.  COMPLICATIONS:  None.  CONDITION:  Stable.  INDICATIONS FOR PROCEDURE:  The patient is a 77 year old female with severe right upper quadrant and epigastric abdominal pain.  Sent to me with ultrasound demonstrating what was thought to be an isoechoic gallbladder with stones contained within.  The patient reported no previous history of surgical removal of the gallbladder.  However, she did report that she had had laparoscopic adhesions removed and previous tubal ligation.  FINDINGS:  The patient had obvious previous removal of the gallbladder with adhesions in the gallbladder fossa, and old surgical clips secondary to that. We quickly realized that there is no further dissection in the gallbladder area.  We removed some adhesions in that area along with the gallbladder and also in the pelvis.  DESCRIPTION OF PROCEDURE:  The patient was taken to the operating room and placed on the table in the supine position.  After an adequate general anesthetic was administered, she was prepped and draped in the usual sterile manner.  A #11 blade was used to make a supraumbilical vertical incision down to the midline fascia which was subsequently grasped with Kocher clamps.  We made a  fascial incision down into the peritoneum and then placed a pursestring suture of 0 Vicryl around this incision.  We subsequently used the Hasson cannula to pass into the peritoneal cavity, and once it was in, we insufflated with carbon dioxide gas up to maximum intra-abdominal pressure of 15 mmHg.  Subsequently, two right costal margin 5 mm cannulas and a subxiphoid 10-11 mm cannula passed under direct vision.  The patient was placed in the reversed Trendelenburg position.  The left side was tilted down.  We began to look for the gallbladder and perform the dissection.  Immediately upon looking at the gallbladder fossa, there was a large amount of omental adhesions which were thought to be secondary to previous inflammation of the gallbladder with adhesions on the omentum to that.  However, as we dissected down, the omentum could be removed, and we subsequently were able to see surgical clips from the previous surgical procedure.  We _______ quickly that the patient had had a previous removal of the gallbladder in spite of her lack of memory of such procedure, and also in spite of not having any very obvious surgical scars upon looking at the abdomen under the direct light of the OR, very faint light and scars from the previous operation could vaguely be made out.  However, it was not very obvious with the exception of a periumbilical incision, that she has had a previous removal of the gallbladder.  We took down adhesions around the gallbladder fossa.  Could see the fossa itself well,  but did not dissect down toward the hymen.  There were also some adhesions of colon to the anterior abdominal wall in the pelvis which were taken down, and this appeared to be secondary to a previous incision in her lower pelvis.  There was no injury to any bowel during this procedure.  We subsequently removed all cannulas.  With all cannulas removed, the gas was removed.  We closed the  supraumbilical fascia using 0 Vicryl suture.  Then, 0.25% Marcaine was injected at all sites, and then we closed the skin using running subcuticular stitches at all sites. Quarter percent Marcaine with epinephrine was injected at all sites.  Sterile dressings were applied. Attending Physician:  Cherylynn Ridges DD:  09/05/00 TD:  09/05/00 Job: [id number]

## 2010-05-27 NOTE — H&P (Signed)
NAMEMarland Kitchen  Amy, Shaffer             ACCOUNT NO.:  [id number]   MEDICAL RECORD NO.:  [id number]          PATIENT TYPE:  INP   LOCATION:  1615                         FACILITY:  [facility]   PHYSICIAN:  Ollen Gross, M.D.    DATE OF BIRTH:  March 25, 1928   DATE OF ADMISSION:  02/26/2008  DATE OF DISCHARGE:                              HISTORY & PHYSICAL   CHIEF COMPLAINT:  Right knee pain.   HISTORY OF PRESENT ILLNESS:  The patient is a 77 year old female who has  been seen by Dr. Lequita Halt for right knee pain.  She had a previous total  knee.  It is felt to be loosening and causing pain.  She had a bone scan  done which showed some increased uptake more indicative of a loosening  prosthesis.  She has activity pain and felt that a revision would be  most helpful in alleviating her pain.  Risks and benefits have been  discussed.  She elects to proceed with surgery.   ALLERGIES:  CODEINE causes breakout rash and itching.   CURRENT MEDICATIONS:  1. Hydrocodone (she is able to take hydrocodone and oxycodone).  2. Meclizine.  3. Cholesterol pill.  4. Tums.   PAST MEDICAL HISTORY:  1. Vertigo.  2. Emphysema.  3. Hypercholesterolemia.  4. Reflux disease.   PAST SURGICAL HISTORY:  Right knee replacement in 2006.   FAMILY HISTORY:  Mother with history of heart attack and asthma.  Brother with heart attack and also another brother with heart attack.   SOCIAL HISTORY:  Married, past smoker.  No alcohol.  Eight children.  Husband will be assisting with care after surgery.  She lives in a one-  story home with one step.  Living will.   REVIEW OF SYSTEMS:  GENERAL:  No fevers, chills, night sweats.  NEURO:  Little bit of vertigo and also  tinnitus.  No seizures or syncope.  RESPIRATORY:  No productive cough or hemoptysis.  CARDIOVASCULAR:  No  chest pain, angina, orthopnea.  GI:  No nausea, vomiting, diarrhea or  constipation.  GU:  No dysuria, hematuria or discharge.  MUSCULOSKELETAL:  Knee  pain.   PHYSICAL EXAMINATION:  VITAL SIGNS:  Pulse 76, respirations 14, blood  pressure 120/80.  GENERAL:  A 77 year old female well-nourished, well-developed in no  acute distress.  She is alert, oriented and cooperative.  HEENT:  Normocephalic, atraumatic.  Pupils are round and reactive.  EOMs  intact.  Does have full upper denture plate.  NECK:  Supple.  CHEST:  Clear with the exception of some crackles at the bases, left  greater than the right.  HEART:  Regular rate and rhythm with occasional skipped ectopic beat.  ABDOMEN:  Soft, slightly round.  Bowel sounds are present.  BREASTS/GENITALIA/RECTAL:  Not done and not pertinent to the present  illness.  EXTREMITIES:  Right knee, no effusion.  No warmth.  Range of motion 5-  125, some slight laxity.   IMPRESSION:  Right total knee arthroplasty.   PLAN:  The patient will be admitted for surgery to undergo a revision of  right total knee arthroplasty.  Surgery  will be performed by Dr. Ollen Gross.      Alexzandrew L. Perkins, P.A.C.      Ollen Gross, M.D.  Electronically Signed    ALP/MEDQ  D:  03/05/2008  T:  03/05/2008  Job:  [id number]   cc:   Ollen Gross, M.D.  Fax: [phone]   Bevelyn Buckles, MD

## 2010-08-11 DIAGNOSIS — R0602 Shortness of breath: Secondary | ICD-10-CM

## 2011-08-27 ENCOUNTER — Emergency Department (HOSPITAL_COMMUNITY)
Admission: EM | Admit: 2011-08-27 | Discharge: 2011-08-27 | Disposition: A | Discharge status: Home / Self Care | Payer: Medicare Other | Attending: Emergency Medicine | Admitting: Emergency Medicine

## 2011-08-27 ENCOUNTER — Emergency Department (HOSPITAL_COMMUNITY): Payer: Medicare Other

## 2011-08-27 ENCOUNTER — Encounter (HOSPITAL_COMMUNITY): Payer: Self-pay | Admitting: *Deleted

## 2011-08-27 DIAGNOSIS — Z79899 Other long term (current) drug therapy: Secondary | ICD-10-CM | POA: Insufficient documentation

## 2011-08-27 DIAGNOSIS — M25559 Pain in unspecified hip: Secondary | ICD-10-CM | POA: Insufficient documentation

## 2011-08-27 DIAGNOSIS — M25561 Pain in right knee: Secondary | ICD-10-CM

## 2011-08-27 DIAGNOSIS — R109 Unspecified abdominal pain: Secondary | ICD-10-CM | POA: Insufficient documentation

## 2011-08-27 DIAGNOSIS — M549 Dorsalgia, unspecified: Secondary | ICD-10-CM | POA: Insufficient documentation

## 2011-08-27 DIAGNOSIS — N390 Urinary tract infection, site not specified: Secondary | ICD-10-CM | POA: Insufficient documentation

## 2011-08-27 DIAGNOSIS — R531 Weakness: Secondary | ICD-10-CM

## 2011-08-27 DIAGNOSIS — M25569 Pain in unspecified knee: Secondary | ICD-10-CM | POA: Insufficient documentation

## 2011-08-27 DIAGNOSIS — R5381 Other malaise: Secondary | ICD-10-CM | POA: Insufficient documentation

## 2011-08-27 LAB — URINALYSIS, ROUTINE W REFLEX MICROSCOPIC
Bilirubin Urine: NEGATIVE
Glucose, UA: NEGATIVE mg/dL
Ketones, ur: 15 mg/dL — AB
Protein, ur: NEGATIVE mg/dL
Urobilinogen, UA: 0.2 mg/dL (ref 0.0–1.0)

## 2011-08-27 LAB — COMPREHENSIVE METABOLIC PANEL
AST: 25 U/L (ref 0–37)
Albumin: 4.3 g/dL (ref 3.5–5.2)
Alkaline Phosphatase: 83 U/L (ref 39–117)
CO2: 25 mEq/L (ref 19–32)
Chloride: 101 mEq/L (ref 96–112)
Creatinine, Ser: 0.84 mg/dL (ref 0.50–1.10)
GFR calc non Af Amer: 63 mL/min — ABNORMAL LOW (ref >90)
Potassium: 4.3 mEq/L (ref 3.5–5.1)
Total Bilirubin: 0.5 mg/dL (ref 0.3–1.2)

## 2011-08-27 LAB — CBC WITH DIFFERENTIAL/PLATELET
Basophils Absolute: 0 10*3/uL (ref 0.0–0.1)
Basophils Relative: 0 % (ref 0–1)
HCT: 45.3 % (ref 36.0–46.0)
Hemoglobin: 15.4 g/dL — ABNORMAL HIGH (ref 12.0–15.0)
Lymphocytes Relative: 15 % (ref 12–46)
Monocytes Absolute: 1 10*3/uL (ref 0.1–1.0)
Monocytes Relative: 10 % (ref 3–12)
Neutro Abs: 7.5 10*3/uL (ref 1.7–7.7)
Neutrophils Relative %: 75 % (ref 43–77)
RDW: 13 % (ref 11.5–15.5)
WBC: 10 10*3/uL (ref 4.0–10.5)

## 2011-08-27 MED ORDER — HYDROCODONE-ACETAMINOPHEN 5-500 MG PO TABS: 1.0000 | ORAL_TABLET | Freq: Four times a day (QID) | ORAL | Status: AC | PRN

## 2011-08-27 MED ORDER — CEPHALEXIN 500 MG PO CAPS: 500.0000 mg | ORAL_CAPSULE | Freq: Four times a day (QID) | ORAL | Status: AC

## 2011-08-27 MED ORDER — IBUPROFEN 600 MG PO TABS: 600.0000 mg | ORAL_TABLET | Freq: Four times a day (QID) | ORAL | Status: AC | PRN

## 2011-08-27 MED ORDER — OXYCODONE-ACETAMINOPHEN 5-325 MG PO TABS
1.0000 | ORAL_TABLET | Freq: Once | ORAL | Status: AC
  Administered 2011-08-27: 1 via ORAL
  Filled 2011-08-27: qty 1

## 2011-08-27 NOTE — ED Notes (Signed)
Pt reports several month hx of right knee pain that she states is now going to right thigh, across lower abdomen, right shoulder, and right neck. Pt states just goes everywhere. Pt denies any specific injury, states she just can't take it anymore. Pt with multiple complaints that are continued to be list as triage continues. No complaints are new states all have been happening just are getting worse.

## 2011-08-27 NOTE — ED Provider Notes (Signed)
History     CSN: [id number]  Arrival date & time 08/27/11  1120   First MD Initiated Contact with Patient 08/27/11 1228      Chief Complaint  Patient presents with  . Knee Pain  . Back Pain  . Abdominal Pain    (Consider location/radiation/quality/duration/timing/severity/associated sxs/prior treatment) Patient is a 78 y.o. female presenting with knee pain and abdominal pain. The history is provided by the patient.  Knee Pain This is a chronic problem. The current episode started more than 1 year ago. The problem occurs constantly. The problem has been gradually worsening. Associated symptoms include abdominal pain, fatigue and weakness. Pertinent negatives include no anorexia, chills, fever, nausea, neck pain, numbness, urinary symptoms or vomiting. The symptoms are aggravated by bending and walking. She has tried NSAIDs for the symptoms. The treatment provided no relief.  Abdominal Pain The primary symptoms of the illness include abdominal pain and fatigue. The primary symptoms of the illness do not include fever, shortness of breath, nausea, vomiting, diarrhea or dysuria. The current episode started more than 2 days ago. The onset of the illness was gradual. The problem has been gradually worsening.  Additional symptoms associated with the illness include back pain. Symptoms associated with the illness do not include chills, anorexia, constipation, urgency, hematuria or frequency.  PT states she has had problems with right knee for several years since her replacement and revision. Pt states pain is just getting wore. Pain radiating into right thigh, right hip, right lower abdomen, right shoulder. Pt did fall 3 weeks ago, has not seen anyone at that time. Per family, pt not walking much for the last 2 weeks, unable to even get out of bed. Pt now also reports lower back pain all the way across, also lower abdominal pain. No fever, chills, urinary symptoms. Pt was seen by her PCP 1 mon ago,  was given mobic, pt states pain not improving. Pt states "I just cant take it anymore."   No past medical history on file.  No past surgical history on file.  No family history on file.  History  Substance Use Topics  . Smoking status: Never Smoker   . Smokeless tobacco: Not on file  . Alcohol Use: No    OB History    Grav Para Term Preterm Abortions TAB SAB Ect Mult Living                  Review of Systems  Constitutional: Positive for fatigue. Negative for fever and chills.  HENT: Negative for neck pain and neck stiffness.   Respiratory: Negative for chest tightness and shortness of breath.   Gastrointestinal: Positive for abdominal pain. Negative for nausea, vomiting, diarrhea, constipation and anorexia.  Genitourinary: Negative for dysuria, urgency, frequency and hematuria.  Musculoskeletal: Positive for back pain.  Skin: Negative.   Neurological: Positive for weakness. Negative for dizziness and numbness.    Allergies  Codeine  Home Medications   Current Outpatient Rx  Name Route Sig Dispense Refill  . ACETAMINOPHEN 500 MG PO TABS Oral Take 500 mg by mouth every 6 (six) hours as needed. For pain    . DULOXETINE HCL 30 MG PO CPEP Oral Take 30 mg by mouth daily.    Marland Kitchen MECLIZINE HCL 25 MG PO TABS Oral Take 25 mg by mouth 2 (two) times daily.    . MELOXICAM 7.5 MG PO TABS Oral Take 7.5 mg by mouth daily.      BP 127/81  Pulse 111  Temp 98.7 F (37.1 C) (Oral)  Resp 16  SpO2 97%  Physical Exam  Nursing note and vitals reviewed. Constitutional: She is oriented to person, place, and time. She appears well-developed and well-nourished. No distress.  HENT:  Head: Normocephalic.  Eyes: Conjunctivae are normal.  Neck: Neck supple.  Cardiovascular: Normal rate, regular rhythm and normal heart sounds.   Pulmonary/Chest: Effort normal. No respiratory distress. She has no wheezes. She has no rales.  Abdominal: Soft. Bowel sounds are normal.       Suprapubic  tenderness  Musculoskeletal: She exhibits no edema.       Right knee with old healed scar from prior surgery. Full ROM of the knee joint. Full ROM of bilateral hips. Knees and hips both stable.   Neurological: She is alert and oriented to person, place, and time.       2+ patellar reflexes. 5/5 and equal strength of the quadriceps, against resistance with plantar and dorsiflexion. No pronator drift. Equal 5/5 grip strength bilaterally  Skin: Skin is warm and dry.  Psychiatric: She has a normal mood and affect.    ED Course  Procedures (including critical care time)  Pt with multiple complaints including pain in practically all joints. No neurodeficits on exam. Pt got up for me and ambulated. Will get x-rays, labs, ua.  Results for orders placed during the hospital encounter of 08/27/11  CBC WITH DIFFERENTIAL      Component Value Range   WBC 10.0  4.0 - 10.5 K/uL   RBC 4.93  3.87 - 5.11 MIL/uL   Hemoglobin 15.4 (*) 12.0 - 15.0 g/dL   HCT 16.1  09.6 - 04.5 %   MCV 91.9  78.0 - 100.0 fL   MCH 31.2  26.0 - 34.0 pg   MCHC 34.0  30.0 - 36.0 g/dL   RDW 40.9  81.1 - 91.4 %   Platelets 421 (*) 150 - 400 K/uL   Neutrophils Relative 75  43 - 77 %   Neutro Abs 7.5  1.7 - 7.7 K/uL   Lymphocytes Relative 15  12 - 46 %   Lymphs Abs 1.5  0.7 - 4.0 K/uL   Monocytes Relative 10  3 - 12 %   Monocytes Absolute 1.0  0.1 - 1.0 K/uL   Eosinophils Relative 0  0 - 5 %   Eosinophils Absolute 0.0  0.0 - 0.7 K/uL   Basophils Relative 0  0 - 1 %   Basophils Absolute 0.0  0.0 - 0.1 K/uL  COMPREHENSIVE METABOLIC PANEL      Component Value Range   Sodium 139  135 - 145 mEq/L   Potassium 4.3  3.5 - 5.1 mEq/L   Chloride 101  96 - 112 mEq/L   CO2 25  19 - 32 mEq/L   Glucose, Bld 104 (*) 70 - 99 mg/dL   BUN 14  6 - 23 mg/dL   Creatinine, Ser 7.82  0.50 - 1.10 mg/dL   Calcium 95.6 (*) 8.4 - 10.5 mg/dL   Total Protein 8.2  6.0 - 8.3 g/dL   Albumin 4.3  3.5 - 5.2 g/dL   AST 25  0 - 37 U/L   ALT 16  0 - 35  U/L   Alkaline Phosphatase 83  39 - 117 U/L   Total Bilirubin 0.5  0.3 - 1.2 mg/dL   GFR calc non Af Amer 63 (*) >90 mL/min   GFR calc Af Amer 74 (*) >90 mL/min  URINALYSIS, ROUTINE W  REFLEX MICROSCOPIC      Component Value Range   Color, Urine YELLOW  YELLOW   APPearance CLOUDY (*) CLEAR   Specific Gravity, Urine 1.016  1.005 - 1.030   pH 5.5  5.0 - 8.0   Glucose, UA NEGATIVE  NEGATIVE mg/dL   Hgb urine dipstick MODERATE (*) NEGATIVE   Bilirubin Urine NEGATIVE  NEGATIVE   Ketones, ur 15 (*) NEGATIVE mg/dL   Protein, ur NEGATIVE  NEGATIVE mg/dL   Urobilinogen, UA 0.2  0.0 - 1.0 mg/dL   Nitrite NEGATIVE  NEGATIVE   Leukocytes, UA MODERATE (*) NEGATIVE  URINE MICROSCOPIC-ADD ON      Component Value Range   Squamous Epithelial / LPF RARE  RARE   WBC, UA 3-6  <3 WBC/hpf   RBC / HPF 0-2  <3 RBC/hpf   Dg Hip Complete Right  08/27/2011  *RADIOLOGY REPORT*  Clinical Data: Persistent abdominal pain.  RIGHT HIP - COMPLETE 2+ VIEW  Comparison: None.  Findings: Hip joint space is maintained bilaterally.  Osseous structures appear somewhat demineralized.  No evidence of acute fracture.  IMPRESSION: Demineralization without acute finding.  Original Report Authenticated By: Reyes Ivan, M.D.   Dg Knee Complete 4 Views Right  08/27/2011  *RADIOLOGY REPORT*  Clinical Data: Right knee pain and right hip pain.  RIGHT KNEE - COMPLETE 4+ VIEW  Comparison: 12/27/2004.  Findings: Right knee arthroplasty.  Mild lucency about the femoral stem may be related to initial placement.  No acute fracture. No joint effusion.  Osteopenia.  IMPRESSION: No acute findings.  Original Report Authenticated By: Reyes Ivan, M.D.   Dg Abd 2 Views  08/27/2011  *RADIOLOGY REPORT*  Clinical Data: Abdominal pain.  Recent fever.  ABDOMEN - 2 VIEW  Comparison: None.  Findings: Gas is seen in nondilated small bowel and colon.  There may be a few scattered air fluid levels.  No unexpected radiopaque calculi.  Surgical  clips are seen in the right upper quadrant. Visualized portion of the lower chest is unremarkable.  IMPRESSION: Bowel gas pattern is nonspecific.  No evidence of obstruction.  Original Report Authenticated By: Reyes Ivan, M.D.     Date: 08/27/2011  Rate: 94  Rhythm: normal sinus rhythm  QRS Axis: normal  Intervals: normal  ST/T Wave abnormalities: normal  Conduction Disutrbances: none  Narrative Interpretation:   Old EKG Reviewed: No significant changes noted  3:58 PM negative x-rays. Labs unremarkable. UA possibly infected, will treat and send cultures. Pt in NAD. No acute abdomen. ECG normal. Will treat with pain medications and follow up with PCP and her orthopedist.   1. Weakness   2. UTI (lower urinary tract infection)   3. Right knee pain   4. Back pain       MDM          Lottie Mussel, PA 08/27/11 1600

## 2011-08-27 NOTE — ED Provider Notes (Signed)
Medical screening examination/treatment/procedure(s) were performed by non-physician practitioner and as supervising physician I was immediately available for consultation/collaboration.  Ethelda Chick, MD 08/27/11 [phone]

## 2011-08-29 LAB — URINE CULTURE
Colony Count: NO GROWTH
Culture: NO GROWTH

## 2011-10-26 ENCOUNTER — Inpatient Hospital Stay (HOSPITAL_COMMUNITY)
Admission: EM | Admit: 2011-10-26 | Discharge: 2011-10-28 | DRG: 190 | Disposition: A | Discharge status: Home / Self Care | Payer: Medicare Other | Attending: Internal Medicine | Admitting: Internal Medicine

## 2011-10-26 ENCOUNTER — Encounter (HOSPITAL_COMMUNITY): Payer: Self-pay | Admitting: *Deleted

## 2011-10-26 ENCOUNTER — Emergency Department (HOSPITAL_COMMUNITY): Payer: Medicare Other

## 2011-10-26 DIAGNOSIS — R82998 Other abnormal findings in urine: Secondary | ICD-10-CM | POA: Diagnosis present

## 2011-10-26 DIAGNOSIS — K219 Gastro-esophageal reflux disease without esophagitis: Secondary | ICD-10-CM | POA: Diagnosis present

## 2011-10-26 DIAGNOSIS — R0603 Acute respiratory distress: Secondary | ICD-10-CM

## 2011-10-26 DIAGNOSIS — F419 Anxiety disorder, unspecified: Secondary | ICD-10-CM

## 2011-10-26 DIAGNOSIS — E860 Dehydration: Secondary | ICD-10-CM

## 2011-10-26 DIAGNOSIS — Z23 Encounter for immunization: Secondary | ICD-10-CM

## 2011-10-26 DIAGNOSIS — I10 Essential (primary) hypertension: Secondary | ICD-10-CM | POA: Diagnosis present

## 2011-10-26 DIAGNOSIS — J984 Other disorders of lung: Secondary | ICD-10-CM

## 2011-10-26 DIAGNOSIS — R7309 Other abnormal glucose: Secondary | ICD-10-CM | POA: Diagnosis present

## 2011-10-26 DIAGNOSIS — R0602 Shortness of breath: Secondary | ICD-10-CM | POA: Diagnosis present

## 2011-10-26 DIAGNOSIS — J441 Chronic obstructive pulmonary disease with (acute) exacerbation: Principal | ICD-10-CM

## 2011-10-26 DIAGNOSIS — D72829 Elevated white blood cell count, unspecified: Secondary | ICD-10-CM | POA: Diagnosis present

## 2011-10-26 DIAGNOSIS — J9601 Acute respiratory failure with hypoxia: Secondary | ICD-10-CM

## 2011-10-26 DIAGNOSIS — J439 Emphysema, unspecified: Secondary | ICD-10-CM | POA: Diagnosis present

## 2011-10-26 DIAGNOSIS — D649 Anemia, unspecified: Secondary | ICD-10-CM

## 2011-10-26 DIAGNOSIS — F411 Generalized anxiety disorder: Secondary | ICD-10-CM | POA: Diagnosis present

## 2011-10-26 DIAGNOSIS — J9600 Acute respiratory failure, unspecified whether with hypoxia or hypercapnia: Secondary | ICD-10-CM | POA: Diagnosis present

## 2011-10-26 HISTORY — DX: Anemia, unspecified: D64.9

## 2011-10-26 HISTORY — DX: Major depressive disorder, single episode, unspecified: F32.9

## 2011-10-26 HISTORY — DX: Diverticulosis of intestine, part unspecified, without perforation or abscess without bleeding: K57.90

## 2011-10-26 HISTORY — DX: Emphysema, unspecified: J43.9

## 2011-10-26 HISTORY — DX: Diverticulitis of intestine, part unspecified, without perforation or abscess without bleeding: K57.92

## 2011-10-26 HISTORY — DX: Hemorrhage of anus and rectum: K62.5

## 2011-10-26 HISTORY — DX: Depression, unspecified: F32.A

## 2011-10-26 HISTORY — DX: Gastro-esophageal reflux disease without esophagitis: K21.9

## 2011-10-26 HISTORY — DX: Angiodysplasia of colon without hemorrhage: K55.20

## 2011-10-26 HISTORY — DX: Other hemorrhoids: K64.8

## 2011-10-26 HISTORY — DX: Anxiety disorder, unspecified: F41.9

## 2011-10-26 HISTORY — DX: Unspecified osteoarthritis, unspecified site: M19.90

## 2011-10-26 LAB — BASIC METABOLIC PANEL
Creatinine, Ser: 0.86 mg/dL (ref 0.50–1.10)
Glucose, Bld: 100 mg/dL — ABNORMAL HIGH (ref 70–99)
Potassium: 3.8 mEq/L (ref 3.5–5.1)
Sodium: 139 mEq/L (ref 135–145)

## 2011-10-26 LAB — CBC
Hemoglobin: 10 g/dL — ABNORMAL LOW (ref 12.0–15.0)
MCH: 30.6 pg (ref 26.0–34.0)
MCHC: 33.4 g/dL (ref 30.0–36.0)
Platelets: 369 10*3/uL (ref 150–400)
RDW: 13.7 % (ref 11.5–15.5)

## 2011-10-26 LAB — HEPATIC FUNCTION PANEL
ALT: 14 U/L (ref 0–35)
Bilirubin, Direct: <0.1 mg/dL (ref 0.0–0.3)

## 2011-10-26 LAB — TROPONIN I
Troponin I: <0.3 ng/mL (ref <0.30)
Troponin I: <0.3 ng/mL (ref <0.30)
Troponin I: <0.3 ng/mL (ref <0.30)

## 2011-10-26 LAB — D-DIMER, QUANTITATIVE: D-Dimer, Quant: 0.43 ug/mL-FEU (ref 0.00–0.48)

## 2011-10-26 LAB — GLUCOSE, CAPILLARY: Glucose-Capillary: 259 mg/dL — ABNORMAL HIGH (ref 70–99)

## 2011-10-26 MED ORDER — ENOXAPARIN SODIUM 40 MG/0.4ML ~~LOC~~ SOLN
40.0000 mg | SUBCUTANEOUS | Status: DC
  Administered 2011-10-26 – 2011-10-27 (×2): 40 mg via SUBCUTANEOUS
  Filled 2011-10-26 (×2): qty 0.4

## 2011-10-26 MED ORDER — DEXTROSE 5 % IV SOLN
500.0000 mg | INTRAVENOUS | Status: DC
  Administered 2011-10-26 – 2011-10-27 (×2): 500 mg via INTRAVENOUS
  Filled 2011-10-26 (×4): qty 500

## 2011-10-26 MED ORDER — POTASSIUM CHLORIDE IN NACL 20-0.9 MEQ/L-% IV SOLN
INTRAVENOUS | Status: DC
  Administered 2011-10-26 – 2011-10-27 (×2): via INTRAVENOUS

## 2011-10-26 MED ORDER — MECLIZINE HCL 12.5 MG PO TABS
25.0000 mg | ORAL_TABLET | Freq: Two times a day (BID) | ORAL | Status: DC | PRN
  Administered 2011-10-27: 25 mg via ORAL
  Filled 2011-10-26: qty 2

## 2011-10-26 MED ORDER — METHYLPREDNISOLONE SODIUM SUCC 125 MG IJ SOLR
INTRAMUSCULAR | Status: AC
  Administered 2011-10-26: 125 mg
  Filled 2011-10-26: qty 2

## 2011-10-26 MED ORDER — ONDANSETRON HCL 4 MG PO TABS: 4.0000 mg | ORAL_TABLET | Freq: Four times a day (QID) | ORAL | Status: DC | PRN

## 2011-10-26 MED ORDER — DEXTROSE 5 % IV SOLN: 1.0000 g | INTRAVENOUS | Status: DC

## 2011-10-26 MED ORDER — BENZONATATE 100 MG PO CAPS: 100.0000 mg | ORAL_CAPSULE | Freq: Three times a day (TID) | ORAL | Status: DC | PRN

## 2011-10-26 MED ORDER — LEVALBUTEROL HCL 0.63 MG/3ML IN NEBU
0.6300 mg | INHALATION_SOLUTION | RESPIRATORY_TRACT | Status: DC
  Administered 2011-10-26 (×2): 0.63 mg via RESPIRATORY_TRACT
  Filled 2011-10-26 (×2): qty 3

## 2011-10-26 MED ORDER — ALUM & MAG HYDROXIDE-SIMETH 200-200-20 MG/5ML PO SUSP: 30.0000 mL | Freq: Four times a day (QID) | ORAL | Status: DC | PRN

## 2011-10-26 MED ORDER — INSULIN ASPART 100 UNIT/ML ~~LOC~~ SOLN
0.0000 [IU] | Freq: Three times a day (TID) | SUBCUTANEOUS | Status: DC
  Administered 2011-10-26: 11 [IU] via SUBCUTANEOUS
  Administered 2011-10-27 – 2011-10-28 (×3): 3 [IU] via SUBCUTANEOUS

## 2011-10-26 MED ORDER — INSULIN ASPART 100 UNIT/ML ~~LOC~~ SOLN
0.0000 [IU] | Freq: Every day | SUBCUTANEOUS | Status: DC
  Administered 2011-10-26: 2 [IU] via SUBCUTANEOUS

## 2011-10-26 MED ORDER — LORAZEPAM 2 MG/ML IJ SOLN
0.5000 mg | Freq: Once | INTRAMUSCULAR | Status: AC
  Administered 2011-10-26: 0.5 mg via INTRAVENOUS
  Filled 2011-10-26: qty 1

## 2011-10-26 MED ORDER — IPRATROPIUM BROMIDE 0.02 % IN SOLN
0.5000 mg | Freq: Once | RESPIRATORY_TRACT | Status: AC
  Administered 2011-10-26: 13:00:00 via RESPIRATORY_TRACT

## 2011-10-26 MED ORDER — DEXTROSE 5 % IV SOLN: 500.0000 mg | INTRAVENOUS | Status: DC

## 2011-10-26 MED ORDER — ALBUTEROL SULFATE (5 MG/ML) 0.5% IN NEBU
5.0000 mg | INHALATION_SOLUTION | Freq: Once | RESPIRATORY_TRACT | Status: AC
  Administered 2011-10-26: 13:00:00 via RESPIRATORY_TRACT

## 2011-10-26 MED ORDER — SODIUM CHLORIDE 0.9 % IJ SOLN
INTRAMUSCULAR | Status: AC
  Administered 2011-10-26: 19:00:00
  Filled 2011-10-26: qty 3

## 2011-10-26 MED ORDER — DULOXETINE HCL 30 MG PO CPEP
30.0000 mg | ORAL_CAPSULE | Freq: Every day | ORAL | Status: DC
  Administered 2011-10-27 – 2011-10-28 (×2): 30 mg via ORAL
  Filled 2011-10-26 (×2): qty 1

## 2011-10-26 MED ORDER — IPRATROPIUM BROMIDE 0.02 % IN SOLN
RESPIRATORY_TRACT | Status: AC
  Filled 2011-10-26: qty 2.5

## 2011-10-26 MED ORDER — GUAIFENESIN-DM 100-10 MG/5ML PO SYRP: 5.0000 mL | ORAL_SOLUTION | ORAL | Status: DC | PRN

## 2011-10-26 MED ORDER — SODIUM CHLORIDE 0.9 % IJ SOLN
INTRAMUSCULAR | Status: AC
  Administered 2011-10-26: 10 mL
  Filled 2011-10-26: qty 3

## 2011-10-26 MED ORDER — ONDANSETRON HCL 4 MG/2ML IJ SOLN: 4.0000 mg | Freq: Four times a day (QID) | INTRAMUSCULAR | Status: DC | PRN

## 2011-10-26 MED ORDER — IPRATROPIUM BROMIDE 0.02 % IN SOLN
0.5000 mg | RESPIRATORY_TRACT | Status: DC
  Administered 2011-10-26 (×2): 0.5 mg via RESPIRATORY_TRACT
  Filled 2011-10-26 (×2): qty 2.5

## 2011-10-26 MED ORDER — DOCUSATE SODIUM 100 MG PO CAPS
100.0000 mg | ORAL_CAPSULE | Freq: Two times a day (BID) | ORAL | Status: DC
  Administered 2011-10-26 – 2011-10-28 (×4): 100 mg via ORAL
  Filled 2011-10-26 (×4): qty 1

## 2011-10-26 MED ORDER — ACETAMINOPHEN 650 MG RE SUPP: 650.0000 mg | Freq: Four times a day (QID) | RECTAL | Status: DC | PRN

## 2011-10-26 MED ORDER — METHYLPREDNISOLONE SODIUM SUCC 125 MG IJ SOLR
60.0000 mg | Freq: Four times a day (QID) | INTRAMUSCULAR | Status: DC
  Administered 2011-10-26 – 2011-10-28 (×8): 60 mg via INTRAVENOUS
  Filled 2011-10-26 (×7): qty 2

## 2011-10-26 MED ORDER — ACETAMINOPHEN 325 MG PO TABS: 650.0000 mg | ORAL_TABLET | Freq: Four times a day (QID) | ORAL | Status: DC | PRN

## 2011-10-26 MED ORDER — OXYCODONE HCL 5 MG PO TABS
5.0000 mg | ORAL_TABLET | ORAL | Status: DC | PRN
  Administered 2011-10-26: 5 mg via ORAL
  Filled 2011-10-26: qty 1

## 2011-10-26 MED ORDER — DEXTROSE 5 % IV SOLN
1.0000 g | INTRAVENOUS | Status: DC
  Administered 2011-10-26 – 2011-10-27 (×2): 1 g via INTRAVENOUS
  Filled 2011-10-26 (×4): qty 10

## 2011-10-26 MED ORDER — ALBUTEROL SULFATE (5 MG/ML) 0.5% IN NEBU
INHALATION_SOLUTION | RESPIRATORY_TRACT | Status: AC
  Filled 2011-10-26: qty 1

## 2011-10-26 MED ORDER — INFLUENZA VIRUS VACC SPLIT PF IM SUSP
0.5000 mL | INTRAMUSCULAR | Status: AC
  Administered 2011-10-27: 0.5 mL via INTRAMUSCULAR
  Filled 2011-10-26: qty 0.5

## 2011-10-26 MED ORDER — ALPRAZOLAM 0.5 MG PO TABS
0.5000 mg | ORAL_TABLET | Freq: Four times a day (QID) | ORAL | Status: DC | PRN
Start: 2011-10-26 — End: 2011-10-28
  Administered 2011-10-26 – 2011-10-27 (×3): 0.5 mg via ORAL
  Filled 2011-10-26 (×3): qty 1

## 2011-10-26 MED ORDER — LEVALBUTEROL HCL 0.63 MG/3ML IN NEBU
0.6300 mg | INHALATION_SOLUTION | RESPIRATORY_TRACT | Status: DC | PRN
  Administered 2011-10-26: 0.63 mg via RESPIRATORY_TRACT
  Filled 2011-10-26: qty 3

## 2011-10-26 MED ORDER — PANTOPRAZOLE SODIUM 40 MG PO TBEC
40.0000 mg | DELAYED_RELEASE_TABLET | Freq: Every day | ORAL | Status: DC
  Administered 2011-10-26 – 2011-10-28 (×3): 40 mg via ORAL
  Filled 2011-10-26 (×3): qty 1

## 2011-10-26 MED ORDER — METHYLPREDNISOLONE SODIUM SUCC 40 MG IJ SOLR: 125.0000 mg | Freq: Once | INTRAMUSCULAR | Status: AC

## 2011-10-26 NOTE — H&P (Signed)
[facility] History and Physical  [name] WUJ:[id number] DOB: 1928-12-11 DOA: 10/26/2011  Referring physician: Adriana Shaffer, M.D. PCP: Amy Heap, MD [name]) Specialists:   Chief Complaint: Shortness of breath.  HPI: [name] is a 78 y.o. female history significant for COPD, anxiety, degenerative joint disease, and gastroesophageal reflux disease, who presents to the emergency department today with a chief complaint of shortness of breath. She started having shortness of breath a couple days ago. It has become progressively worse. She now has shortness of breath at rest. She denies symptoms consistent with orthopnea. She denies symptoms consistent with paroxysmal nocturia dyspnea. She has associated wheezing and chest congestion. According to the patient and her daughters, her symptoms are quite unusual. She was never told that she had COPD or emphysema. She was told that she may have asthma. She has never been hospitalized for acute bronchitis 4 acute exacerbation of emphysema. After a 60-pack-year history, she stopped smoking 5 years ago. She has had some chest tightness in the center of her chest which has been exacerbated by coughing. Her cough has been nonproductive. She has some radiation of the chest tightness to her right arm, but she says that she has chronic pain in her right shoulder. She denies fever, chills, pleurisy, sore throat, earache, nausea, and vomiting. She denies abdominal pain. She denies pain with urination. She does have chronic pain in her knees, left greater than right.  Of note, she was seen in her primary care doctor's office today. According to the office notes, her oxygen saturation was 78% and she was tachycardic with a heart rate of 125. After oxygen was applied, her oxygen saturation improved to 85%. Currently, she is oxygenating 100% on 2 L of oxygen. Her chest x-ray reveals mild emphysematous changes but no acute abnormalities. Her lab data  are significant for BUN of 26, WBC of 11.2, hemoglobin of 10.0, and normal troponin I. She is being admitted for further evaluation and management.   Review of Systems: As above in history present illness. Otherwise negative.  Past Medical History  Diagnosis Date  . Emphysema   . GERD (gastroesophageal reflux disease)   . Depression   . Anxiety   . Anemia   . DJD (degenerative joint disease)   . Diverticulosis 10/2009    Pandiverticulosis, per colonoscopy, Dr. Darrick Shaffer  . Acute diverticulitis   . AVM (arteriovenous malformation) of colon   . Internal hemorrhoids   . Rectal bleeding     Secondary to diverticulosis, hemorrhoids, and/or cecal AVMs   Past Surgical History  Procedure Date  . Right total knee arthroplasty    Social History: She is married. She has 8 children. She is a retired housewife. She smoked for more than 60 years, one pack of cigarettes daily. She stopped smoking 5 years ago. She denies alcohol use. She denies illicit drug use. She does not drive.   Allergies  Allergen Reactions  . Avelox (Moxifloxacin Hcl In Nacl)   . Codeine Nausea And Vomiting  . Cortisone Rash    Family history: Her mother died of complications of asthma. She never knew her biological father.  Prior to Admission medications   Medication Sig Start Date End Date Taking? Authorizing Provider  acetaminophen (TYLENOL) 500 MG tablet Take 500 mg by mouth every 6 (six) hours as needed. For pain   Yes Historical Provider, MD  DULoxetine (CYMBALTA) 30 MG capsule Take 30 mg by mouth daily.   Yes Historical Provider, MD  meclizine [name])  25 MG tablet Take 25 mg by mouth 2 (two) times daily.   Yes Historical Provider, MD  meloxicam (MOBIC) 7.5 MG tablet Take 7.5 mg by mouth daily.   Yes Historical Provider, MD   Physical Exam: Filed Vitals:   10/26/11 1236 10/26/11 1309 10/26/11 1356 10/26/11 1400  BP:    154/127  Pulse:    107  Temp: 97.9 F (36.6 C)     TempSrc: Oral     Resp:    22    SpO2:  100% 100% 100%     General:  Anxious 78 year old African-American woman sitting up in the bed.  Eyes: PERRLA. EOMI. conjunctivae are clear. Sclerae are white. Conjunctivae are clear.  ENT: Tympanic membranes are clear bilaterally. Nasal mucosa is dry. Oropharynx reveals mildly dry mucous membranes. Full set of dentures. No posterior exudates or erythema.  Neck: Supple, no adenopathy, no thyromegaly, no JVD. She is not using accessory neck muscles to breathe.  Cardiovascular: S1, S2, with borderline tachycardia.  Respiratory: Mildly labored breathing when talking. Coarse breath sounds with occasional fine wheezes bilaterally.  Abdomen: Mildly obese, positive bowel sounds, soft, nontender, nondistended.  Skin: Fair turgor. No rashes.  Musculoskeletal: Arthritic changes noted on both knees. No acute hot red joints. No joint effusion. Pedal pulses palpable. No pedal edema.  Psychiatric: She is somewhat anxious. She is alert and oriented x3. Her speech is clear.  Neurologic: Cranial nerves II through XII are intact. Strength is globally 5 minus over 5. Sensation is intact.  Labs on Admission:  Basic Metabolic Panel:  Lab 10/26/11 4782  NA 139  K 3.8  CL 105  CO2 20  GLUCOSE 100*  BUN 26*  CREATININE 0.86  CALCIUM 9.5  MG --  PHOS --   Liver Function Tests: No results found for this basename: AST:5,ALT:5,ALKPHOS:5,BILITOT:5,PROT:5,ALBUMIN:5 in the last 168 hours No results found for this basename: LIPASE:5,AMYLASE:5 in the last 168 hours No results found for this basename: AMMONIA:5 in the last 168 hours CBC:  Lab 10/26/11 1236  WBC 11.2*  NEUTROABS --  HGB 10.0*  HCT 29.9*  MCV 91.4  PLT 369   Cardiac Enzymes:  Lab 10/26/11 1236  CKTOTAL --  CKMB --  CKMBINDEX --  TROPONINI <0.30    BNP (last 3 results) No results found for this basename: PROBNP:3 in the last 8760 hours CBG: No results found for this basename: GLUCAP:5 in the last 168  hours  Radiological Exams on Admission: Dg Chest Portable 1 View  10/26/2011  *RADIOLOGY REPORT*  Clinical Data: Shortness of breath, anxiety, GERD  PORTABLE CHEST - 1 VIEW  Comparison: Portable exam 1255 hours compared to 08/10/2010  Findings: Normal heart size, mediastinal contours, and pulmonary vascularity. Atherosclerotic calcification aorta. Lungs appear mildly emphysematous but clear. No pleural effusion or pneumothorax. Diffuse osseous demineralization.  IMPRESSION: Mild emphysematous changes. No acute abnormalities.   Original Report Authenticated By: Lollie Marrow, M.D.     EKG: Sinus tachycardia with a heart rate of 104 beats per minute and no significant ST or T wave abnormalities.  Assessment/Plan Principal Problem:  *COPD with exacerbation Active Problems:  Acute respiratory distress  Acute respiratory failure with hypoxia  Emphysema  Anemia  Dehydration  Anxiety  GERD (gastroesophageal reflux disease)   1. This is an 78 year old woman who has emphysema radiographically. She has a 60-pack-year history of smoking, but she quit 5 years ago. Surprisingly, this is her first COPD exacerbation. She was apparently hypoxic in her doctor's  office. Currently, in the emergency department following oxygen therapy, Solu-Medrol, and bronchodilators, she is oxygenating 100% on only 2 L of nasal cannula oxygen. An ABG was attempted, but apparently the results were not consistent with an arterial sample. Because it was very painful for the patient, the repeat ABG was canceled. According to ED physician, Dr. Adriana Shaffer, she was in respiratory distress on arrival. However, she did not require BiPAP or obviously intubation. Currently, she appears to be more comfortable. Her lab data are consistent with anemia and dehydration. Her urinalysis shows mild pyuria, but she does not have dysuria.    Plan:  1. The patient received 125 mg of Solu-Medrol and bronchodilator therapy with Atrovent and albuterol  nebulizers. 2. We'll continue Atrovent nebulization every 4 hours. We'll change albuterol to Xopenex every 4 hours. We'll continue Solu-Medrol at 60 mg every 6 hours. This will be adjusted accordingly.  3. We'll start antibiotic treatment with IV azithromycin and Rocephin. 4. Apply oxygen as needed and to keep her oxygen saturations greater than 92%. 5. Start IV fluid hydration. 6. We'll start sliding scale NovoLog in anticipation that her blood glucose will increase to steroids. 7. When necessary Xanax for anxiousness. 8. For further evaluation, will order a d-dimer. I do expect the d-dimer to be elevated, but if it is excessively elevated, will consider ordering a CT angiogram of her chest given that she was apparently hypoxic in her physician's office. We'll also order cardiac enzymes, TSH, and anemia panel. 9. Low threshold for transferring her to the step down unit or ICU.Marland Kitchen She will be admitted to telemetry for now.  Code Status: Full code.  Family Communication: discussed with her 2 daughters and granddaughters.  Disposition Plan: Anticipated length of stay: 3 days not counting today.. Plan discharge to home with physical therapy if needed.   Time spent: 1 hour and 15 minutes.   Takao Lizer [facility]   If 7PM-7AM, please contact night-coverage [email or web address] Password [email or web address] 10/26/2011, 3:42 PM

## 2011-10-26 NOTE — ED Notes (Signed)
Attempted to call report to floor, will call back in 10 minutes.

## 2011-10-26 NOTE — ED Notes (Signed)
Pt sent from [facility]. SOB began yesterday and has became worse.

## 2011-10-26 NOTE — ED Notes (Signed)
Pt has calmed down since arrival, respirations even and unlabored, pt in no acute distress at this time.

## 2011-10-26 NOTE — ED Provider Notes (Signed)
History   This chart was scribed for Amy Hutching, MD by Sofie Rower. The patient was seen in room APA02/APA02 and the patient's care was started at 12:21PM.    CSN: [id number]  Arrival date & time 10/26/11  1221   None     Chief Complaint  Patient presents with  . Shortness of Breath    (Consider location/radiation/quality/duration/timing/severity/associated sxs/prior treatment) Patient is a 78 y.o. female presenting with shortness of breath. The history is provided by the patient. No language interpreter was used.  Shortness of Breath  The current episode started yesterday. The onset was sudden. The problem occurs rarely. The problem has been gradually worsening. The problem is moderate. Nothing relieves the symptoms. Nothing aggravates the symptoms. Associated symptoms include shortness of breath. Her past medical history is significant for asthma. Recently, medical care has been given by the PCP.    [name] is a 78 y.o. female , Sent from [facility], with a hx of asthma, who presents to the Emergency Department complaining of sudden, progressively worsening, shortness of breath, onset yesterday.   The pt does not smoke or drink alcohol.   PCP is Dr. Christell Constant.    Past Medical History  Diagnosis Date  . DJD (degenerative joint disease)   . GERD (gastroesophageal reflux disease)   . Depression   . Anxiety   . Anemia     Past Surgical History  Procedure Date  . Unable     No family history on file.  History  Substance Use Topics  . Smoking status: Never Smoker   . Smokeless tobacco: Not on file  . Alcohol Use: No    OB History    Grav Para Term Preterm Abortions TAB SAB Ect Mult Living                  Review of Systems  Respiratory: Positive for shortness of breath.   All other systems reviewed and are negative.    Allergies  Avelox; Codeine; and Cortisone  Home Medications   Current Outpatient Rx  Name Route Sig Dispense Refill   . ACETAMINOPHEN 500 MG PO TABS Oral Take 500 mg by mouth every 6 (six) hours as needed. For pain    . DULOXETINE HCL 30 MG PO CPEP Oral Take 30 mg by mouth daily.    Marland Kitchen MECLIZINE HCL 25 MG PO TABS Oral Take 25 mg by mouth 2 (two) times daily.    . MELOXICAM 7.5 MG PO TABS Oral Take 7.5 mg by mouth daily.      BP 138/103  Pulse 61  Temp 97.9 F (36.6 C) (Oral)  Resp 26  SpO2 100%  Physical Exam  Nursing note and vitals reviewed. Constitutional: She is oriented to person, place, and time. She appears well-developed and well-nourished.  HENT:  Head: Normocephalic and atraumatic.  Eyes: Conjunctivae normal and EOM are normal. Pupils are equal, round, and reactive to light.  Neck: Normal range of motion. Neck supple.  Cardiovascular: Normal rate, regular rhythm and normal heart sounds.   Pulmonary/Chest: Effort normal. She has wheezes.       Bilateral expiratory wheezing detected.   Abdominal: Soft. Bowel sounds are normal.  Musculoskeletal: Normal range of motion.  Neurological: She is alert and oriented to person, place, and time.  Skin: Skin is warm and dry.  Psychiatric: She has a normal mood and affect.    ED Course  Procedures (including critical care time)  DIAGNOSTIC STUDIES: Oxygen Saturation is  100% on room air, normal by my interpretation.    COORDINATION OF CARE:    12:21PM- Breathing treatment ordered.   12:22PM- Treatment plan concerning chest x-ray, blood work, and breathing treatment discussed. Pt agrees with treatment.   Labs Reviewed  CBC - Abnormal; Notable for the following:    WBC 11.2 (*)     RBC 3.27 (*)     Hemoglobin 10.0 (*)     HCT 29.9 (*)     All other components within normal limits  BASIC METABOLIC PANEL - Abnormal; Notable for the following:    Glucose, Bld 100 (*)     BUN 26 (*)     GFR calc non Af Amer 62 (*)     GFR calc Af Amer 71 (*)     All other components within normal limits  TROPONIN I   No results found.   No  diagnosis found.   Date: 10/26/2011  Rate: 104  Rhythm: sinus tachycardia  QRS Axis: normal  Intervals: normal  ST/T Wave abnormalities: normal  Conduction Disutrbances:none  Narrative Interpretation:   Old EKG Reviewed: none available   Dg Chest Portable 1 View  10/26/2011  *RADIOLOGY REPORT*  Clinical Data: Shortness of breath, anxiety, GERD  PORTABLE CHEST - 1 VIEW  Comparison: Portable exam 1255 hours compared to 08/10/2010  Findings: Normal heart size, mediastinal contours, and pulmonary vascularity. Atherosclerotic calcification aorta. Lungs appear mildly emphysematous but clear. No pleural effusion or pneumothorax. Diffuse osseous demineralization.  IMPRESSION: Mild emphysematous changes. No acute abnormalities.   Original Report Authenticated By: Lollie Marrow, M.D.      CRITICAL CARE Performed by: [name]   Total critical care time: 30  Critical care time was exclusive of separately billable procedures and treating other patients.  Critical care was necessary to treat or prevent imminent or life-threatening deterioration.  Critical care was time spent personally by me on the following activities: development of treatment plan with patient and/or surrogate as well as nursing, discussions with consultants, evaluation of patient's response to treatment, examination of patient, obtaining history from patient or surrogate, ordering and performing treatments and interventions, ordering and review of laboratory studies, ordering and review of radiographic studies, pulse oximetry and re-evaluation of patient's condition.  MDM  Patient presents with respiratory extremis.  She was transferred emergently from primary care doctor's office. On initial exam she was dyspneic and tachypneic with bilateral expiratory wheezes.  She improved with albuterol/Atrovent breathing treatment along with IV Solu Medrol. Chest x-ray shows no acute changes.  She is feeling better; however secondary to  her age, I will admit her.      I personally performed the services described in this documentation, which was scribed in my presence. The recorded information has been reviewed and considered.    Amy Hutching, MD 10/26/11 [phone]

## 2011-10-27 DIAGNOSIS — D649 Anemia, unspecified: Secondary | ICD-10-CM

## 2011-10-27 LAB — GLUCOSE, CAPILLARY
Glucose-Capillary: 130 mg/dL — ABNORMAL HIGH (ref 70–99)
Glucose-Capillary: 113 mg/dL — ABNORMAL HIGH (ref 70–99)
Glucose-Capillary: 145 mg/dL — ABNORMAL HIGH (ref 70–99)
Glucose-Capillary: 117 mg/dL — ABNORMAL HIGH (ref 70–99)

## 2011-10-27 LAB — CBC
HCT: 26.4 % — ABNORMAL LOW (ref 36.0–46.0)
Hemoglobin: 8.7 g/dL — ABNORMAL LOW (ref 12.0–15.0)
MCHC: 33 g/dL (ref 30.0–36.0)
MCV: 91.7 fL (ref 78.0–100.0)
RDW: 14 % (ref 11.5–15.5)

## 2011-10-27 LAB — COMPREHENSIVE METABOLIC PANEL
ALT: 13 U/L (ref 0–35)
Albumin: 3.2 g/dL — ABNORMAL LOW (ref 3.5–5.2)
Alkaline Phosphatase: 54 U/L (ref 39–117)
Calcium: 8.9 mg/dL (ref 8.4–10.5)
GFR calc Af Amer: >90 mL/min (ref >90)
Glucose, Bld: 137 mg/dL — ABNORMAL HIGH (ref 70–99)
Potassium: 4.3 mEq/L (ref 3.5–5.1)
Sodium: 138 mEq/L (ref 135–145)
Total Protein: 6 g/dL (ref 6.0–8.3)

## 2011-10-27 LAB — IRON AND TIBC: TIBC: 243 ug/dL — ABNORMAL LOW (ref 250–470)

## 2011-10-27 LAB — HEMOGLOBIN A1C: Hgb A1c MFr Bld: 5.6 % (ref <5.7)

## 2011-10-27 LAB — TROPONIN I: Troponin I: <0.3 ng/mL (ref <0.30)

## 2011-10-27 MED ORDER — IPRATROPIUM BROMIDE 0.02 % IN SOLN
0.5000 mg | RESPIRATORY_TRACT | Status: DC
  Administered 2011-10-27 – 2011-10-28 (×7): 0.5 mg via RESPIRATORY_TRACT
  Filled 2011-10-27 (×7): qty 2.5

## 2011-10-27 MED ORDER — LEVALBUTEROL HCL 0.63 MG/3ML IN NEBU
0.6300 mg | INHALATION_SOLUTION | RESPIRATORY_TRACT | Status: DC
  Administered 2011-10-27 – 2011-10-28 (×7): 0.63 mg via RESPIRATORY_TRACT
  Filled 2011-10-27 (×7): qty 3

## 2011-10-27 MED ORDER — MECLIZINE HCL 12.5 MG PO TABS
25.0000 mg | ORAL_TABLET | Freq: Two times a day (BID) | ORAL | Status: DC
  Administered 2011-10-27 – 2011-10-28 (×2): 25 mg via ORAL
  Filled 2011-10-27 (×2): qty 2

## 2011-10-27 MED ORDER — GUAIFENESIN ER 600 MG PO TB12
1200.0000 mg | ORAL_TABLET | Freq: Two times a day (BID) | ORAL | Status: DC
  Administered 2011-10-27 – 2011-10-28 (×2): 1200 mg via ORAL
  Filled 2011-10-27 (×2): qty 2

## 2011-10-27 NOTE — Progress Notes (Signed)
[facility]             Progress Note   Subjective: Patient reports she feels better, although she is still short of breath.  She has a non productive cough. Still becomes very dyspneic on minimal exertion  Objective: Vital signs in last 24 hours: Temp:  [97.7 F (36.5 C)-98.7 F (37.1 C)] 98.2 F (36.8 C) (10/18 1506) Pulse Rate:  [98-110] 105  (10/18 1506) Resp:  [20-30] 30  (10/18 1506) BP: (103-126)/(47-74) 105/61 mmHg (10/18 1506) SpO2:  [93 %-100 %] 95 % (10/18 1506) Weight:  [73.9 kg (162 lb 14.7 oz)] 73.9 kg (162 lb 14.7 oz) (10/17 1640) Weight change:  Last BM Date: 10/25/11  Intake/Output from previous day: 10/17 0701 - 10/18 0700 In: 360 [P.O.:360] Out: -  Total I/O In: 240 [P.O.:240] Out: -    Physical Exam: General: Alert, awake, oriented x3, in no acute distress. HEENT: No bruits, no goiter. Heart: Regular rate and rhythm, without murmurs, rubs, gallops. Lungs: Clear to auscultation bilaterally. No wheezing. Abdomen: Soft, nontender, nondistended, positive bowel sounds. Extremities: No clubbing cyanosis or edema with positive pedal pulses. Neuro: Grossly intact, nonfocal.    Lab Results: Basic Metabolic Panel:  Basename 10/27/11 0413 10/26/11 1236  NA 138 139  K 4.3 3.8  CL 107 105  CO2 20 20  GLUCOSE 137* 100*  BUN 15 26*  CREATININE 0.68 0.86  CALCIUM 8.9 9.5  MG -- 2.1  PHOS -- --   Liver Function Tests:  Basename 10/27/11 0413 10/26/11 1236  AST 16 17  ALT 13 14  ALKPHOS 54 63  BILITOT 0.2* 0.3  PROT 6.0 6.8  ALBUMIN 3.2* 3.7   No results found for this basename: LIPASE:2,AMYLASE:2 in the last 72 hours No results found for this basename: AMMONIA:2 in the last 72 hours CBC:  Basename 10/27/11 0413 10/26/11 1236  WBC 10.2 11.2*  NEUTROABS -- --  HGB 8.7* 10.0*  HCT 26.4* 29.9*  MCV 91.7 91.4  PLT 324 369   Cardiac Enzymes:  Basename 10/27/11 0412 10/26/11 2156 10/26/11 1600  CKTOTAL -- -- --  CKMB -- --  --  CKMBINDEX -- -- --  TROPONINI <0.30 <0.30 <0.30   BNP: No results found for this basename: PROBNP:3 in the last 72 hours D-Dimer:  Basename 10/26/11 1600  DDIMER 0.43   CBG:  Basename 10/27/11 1150 10/27/11 0750 10/26/11 2054 10/26/11 1754  GLUCAP 113* 130* 233* 259*   Hemoglobin A1C:  Basename 10/26/11 1236  HGBA1C 5.6   Fasting Lipid Panel: No results found for this basename: CHOL,HDL,LDLCALC,TRIG,CHOLHDL,LDLDIRECT in the last 72 hours Thyroid Function Tests:  Basename 10/26/11 1236  TSH 1.825  T4TOTAL --  FREET4 --  T3FREE --  THYROIDAB --   Anemia Panel:  Basename 10/27/11 0413  VITAMINB12 351  FOLATE >20.0  FERRITIN 42  TIBC 243*  IRON 55  RETICCTPCT --   Coagulation: No results found for this basename: LABPROT:2,INR:2 in the last 72 hours Urine Drug Screen: Drugs of Abuse  No results found for this basename: labopia, cocainscrnur, labbenz, amphetmu, thcu, labbarb    Alcohol Level: No results found for this basename: ETH:2 in the last 72 hours Urinalysis: No results found for this basename: COLORURINE:2,APPERANCEUR:2,LABSPEC:2,PHURINE:2,GLUCOSEU:2,HGBUR:2,BILIRUBINUR:2,KETONESUR:2,PROTEINUR:2,UROBILINOGEN:2,NITRITE:2,LEUKOCYTESUR:2 in the last 72 hours M No results found for this or any previous visit (from the past 240 hour(s)).  Studies/Results: Dg Chest Portable 1 View  10/26/2011  *RADIOLOGY REPORT*  Clinical Data: Shortness of breath, anxiety, GERD  PORTABLE CHEST -  1 VIEW  Comparison: Portable exam 1255 hours compared to 08/10/2010  Findings: Normal heart size, mediastinal contours, and pulmonary vascularity. Atherosclerotic calcification aorta. Lungs appear mildly emphysematous but clear. No pleural effusion or pneumothorax. Diffuse osseous demineralization.  IMPRESSION: Mild emphysematous changes. No acute abnormalities.   Original Report Authenticated By: Lollie Marrow, M.D.     Medications: Scheduled Meds:   . azithromycin  500 mg  Intravenous Q24H  . cefTRIAXone (ROCEPHIN)  IV  1 g Intravenous Q24H  . docusate sodium  100 mg Oral BID  . DULoxetine  30 mg Oral Daily  . enoxaparin (LOVENOX) injection  40 mg Subcutaneous Q24H  . influenza  inactive virus vaccine  0.5 mL Intramuscular Tomorrow-1000  . insulin aspart  0-20 Units Subcutaneous TID WC  . insulin aspart  0-5 Units Subcutaneous QHS  . ipratropium  0.5 mg Nebulization Q4H WA  . levalbuterol  0.63 mg Nebulization Q4H WA  . methylPREDNISolone (SOLU-MEDROL) injection  60 mg Intravenous Q6H  . pantoprazole  40 mg Oral Daily  . sodium chloride      . sodium chloride      . DISCONTD: azithromycin  500 mg Intravenous Q24H  . DISCONTD: cefTRIAXone (ROCEPHIN)  IV  1 g Intravenous Q24H  . DISCONTD: ipratropium  0.5 mg Nebulization Q4H  . DISCONTD: levalbuterol  0.63 mg Nebulization Q4H   Continuous Infusions:   . 0.9 % NaCl with KCl 20 mEq / L 75 mL/hr at 10/27/11 0808   PRN Meds:.acetaminophen, acetaminophen, ALPRAZolam, alum & mag hydroxide-simeth, benzonatate, guaiFENesin-dextromethorphan, levalbuterol, meclizine, ondansetron (ZOFRAN) IV, ondansetron, oxyCODONE  Assessment/Plan:  Principal Problem:  *COPD with exacerbation Active Problems:  Acute respiratory distress  Emphysema  Anemia  Dehydration  Anxiety  GERD (gastroesophageal reflux disease)  Acute respiratory failure with hypoxia  1. COPD.  Continue current treatments with steroids, abx and neb treatments.  Ambulate in halls.  Encourage incentive spirometry  2. Anemia.  Baseline hemoglobin appears to be around 10.  Anemia panel indicates chronic disease, no evidence of bleeding.  Continue to follow. Check stool occult blood.  3. Vertigo.  Continue outpatient meclizine  4. GERD, on ppi  5. Dehydration, improved with IVF  6. Dispo.  Anticipate discharge home in next 24-48 hours    Time spent coordinating care:   LOS: 1 day   Tramar Brueckner [facility] Pager:  [phone] 10/27/2011, 3:33 PM

## 2011-10-27 NOTE — Progress Notes (Addendum)
Abg ordered for 10/26/2011 has been canceled. Gas was never done as to my knowledge. But reported off as done.  Abg drawn 10/26/2011 15:03  Not in computer Ph 7.400, PCO2 37.7, PO2 26.6, HCO3 22.9 ,sO2 42.9 --  Most likely venous gas , room air

## 2011-10-27 NOTE — Progress Notes (Signed)
UR Chart Review Completed  

## 2011-10-28 DIAGNOSIS — F411 Generalized anxiety disorder: Secondary | ICD-10-CM

## 2011-10-28 LAB — CBC
HCT: 25.2 % — ABNORMAL LOW (ref 36.0–46.0)
Platelets: 347 10*3/uL (ref 150–400)
RDW: 14.5 % (ref 11.5–15.5)
WBC: 20.8 10*3/uL — ABNORMAL HIGH (ref 4.0–10.5)

## 2011-10-28 LAB — URINE CULTURE: Colony Count: NO GROWTH

## 2011-10-28 LAB — GLUCOSE, CAPILLARY: Glucose-Capillary: 83 mg/dL (ref 70–99)

## 2011-10-28 MED ORDER — PREDNISONE (PAK) 10 MG PO TABS: 10.0000 mg | ORAL_TABLET | Freq: Every day | ORAL | Status: DC

## 2011-10-28 MED ORDER — PANTOPRAZOLE SODIUM 40 MG PO TBEC: 40.0000 mg | DELAYED_RELEASE_TABLET | Freq: Every day | ORAL | Status: DC

## 2011-10-28 MED ORDER — ALBUTEROL SULFATE HFA 108 (90 BASE) MCG/ACT IN AERS: 2.0000 | INHALATION_SPRAY | RESPIRATORY_TRACT | Status: DC | PRN

## 2011-10-28 MED ORDER — DOXYCYCLINE HYCLATE 100 MG PO TABS: 100.0000 mg | ORAL_TABLET | Freq: Two times a day (BID) | ORAL | Status: DC

## 2011-10-28 MED ORDER — POLYSACCHARIDE IRON COMPLEX 150 MG PO CAPS: 150.0000 mg | ORAL_CAPSULE | Freq: Two times a day (BID) | ORAL | Status: DC

## 2011-10-28 MED ORDER — GUAIFENESIN ER 600 MG PO TB12: 1200.0000 mg | ORAL_TABLET | Freq: Two times a day (BID) | ORAL | Status: DC

## 2011-10-28 NOTE — Progress Notes (Signed)
D/c instructions reviewed with patient and family.  Verbalized understanding.  Pt dc'd to home with family.  Schonewitz, Candelaria Stagers 10/28/2011

## 2011-10-28 NOTE — Discharge Summary (Signed)
Physician Discharge Summary  [name]:[id number] DOB: [birth year] DOA: 10/26/2011  PCP: Rudi Heap, MD  Admit date: 10/26/2011 Discharge date: 10/28/2011  Recommendations for Outpatient Follow-up:  1. Follow up with primary care doctor next week.  Recheck CBC on follow up visit to follow up on anemia and leukocytosis  Discharge Diagnoses:  Principal Problem:  *COPD with exacerbation Active Problems:  Acute respiratory distress  Emphysema  Anemia  Dehydration  Anxiety  GERD (gastroesophageal reflux disease)  Acute respiratory failure with hypoxia Steroid induced leukocytosis  Discharge Condition: improved  Diet recommendation: low salt  Filed Weights   10/26/11 1640  Weight: 73.9 kg (162 lb 14.7 oz)    History of present illness:  [name] is a 78 y.o. female history significant for COPD, anxiety, degenerative joint disease, and gastroesophageal reflux disease, who presents to the emergency department today with a chief complaint of shortness of breath. She started having shortness of breath a couple days ago. It has become progressively worse. She now has shortness of breath at rest. She denies symptoms consistent with orthopnea. She denies symptoms consistent with paroxysmal nocturia dyspnea. She has associated wheezing and chest congestion. According to the patient and her daughters, her symptoms are quite unusual. She was never told that she had COPD or emphysema. She was told that she may have asthma. She has never been hospitalized for acute bronchitis 4 acute exacerbation of emphysema. After a 60-pack-year history, she stopped smoking 5 years ago. She has had some chest tightness in the center of her chest which has been exacerbated by coughing. Her cough has been nonproductive. She has some radiation of the chest tightness to her right arm, but she says that she has chronic pain in her right shoulder. She denies fever, chills, pleurisy, sore throat,  earache, nausea, and vomiting. She denies abdominal pain. She denies pain with urination. She does have chronic pain in her knees, left greater than right.  Of note, she was seen in her primary care doctor's office today. According to the office notes, her oxygen saturation was 78% and she was tachycardic with a heart rate of 125. After oxygen was applied, her oxygen saturation improved to 85%. Currently, she is oxygenating 100% on 2 L of oxygen. Her chest x-ray reveals mild emphysematous changes but no acute abnormalities. Her lab data are significant for BUN of 26, WBC of 11.2, hemoglobin of 10.0, and normal troponin I. She is being admitted for further evaluation and management.   Hospital Course:  Patient was admitted to the hospital for shortness of breath due to exacerbation of copd.  She was placed on IV steroids, abx and neb treatments.  With treatment, she began to improve and her shortness of breath resolved.  She is now off supplemental oxygen and is ambulating in the halls without difficulty.  She will be transitioned to oral steroids and abx. She will receive an albuterol MDI on discharge  Patient was also noted to be significantly anemic.  Review or prior records indicates that her baseline hgb is likely from 10-11.  Currently she is 8.3.  She has not had any evidence of bleeding.  Anemia panel indicates an iron deficiency and she reports being on oral iron therapy as an outpatient. The cause of her anemia is not entirely clear.  She does not appear to be actively bleeding.  This will need to be further addressed by her primary care doctor. I have asked her to follow up next week with  her pmd and have repeat CBC at that time. Her leukocytosis appears to be related to steroids.  She is afebrile and does not appear toxic.  She is requesting to be discharged home today and I think that would be appropriate.  Procedures:  none  Consultations:  none  Discharge Exam: Filed Vitals:    10/27/11 2144 10/28/11 0500 10/28/11 0711 10/28/11 1114  BP: 105/65 102/68    Pulse: 97 98    Temp: 97.6 F (36.4 C) 97.5 F (36.4 C)    TempSrc: Oral Oral    Resp: 20 20    Height:      Weight:      SpO2: 97% 97% 97% 99%    General: NAD Cardiovascular: s1, s2, rrr Respiratory: cta b  Discharge Instructions  Discharge Orders    Future Orders Please Complete By Expires   Diet - low sodium heart healthy      Increase activity slowly      Call MD for:  difficulty breathing, headache or visual disturbances      Call MD for:  temperature >100.4      Call MD for:  persistant dizziness or light-headedness          Medication List     As of 10/28/2011 12:25 PM    STOP taking these medications         meloxicam 7.5 MG tablet   Commonly known as: MOBIC      TAKE these medications         acetaminophen 500 MG tablet   Commonly known as: TYLENOL   Take 500 mg by mouth every 6 (six) hours as needed. For pain      albuterol 108 (90 BASE) MCG/ACT inhaler   Commonly known as: PROVENTIL HFA;VENTOLIN HFA   Inhale 2 puffs into the lungs every 4 (four) hours as needed for wheezing.      doxycycline 100 MG tablet   Commonly known as: VIBRA-TABS   Take 1 tablet (100 mg total) by mouth 2 (two) times daily.      DULoxetine 30 MG capsule   Commonly known as: CYMBALTA   Take 30 mg by mouth daily.      guaiFENesin 600 MG 12 hr tablet   Commonly known as: MUCINEX   Take 2 tablets (1,200 mg total) by mouth 2 (two) times daily.      iron polysaccharides 150 MG capsule   Commonly known as: NIFEREX   Take 1 capsule (150 mg total) by mouth 2 (two) times daily.      meclizine 25 MG tablet   Commonly known as: ANTIVERT   Take 25 mg by mouth 2 (two) times daily.      pantoprazole 40 MG tablet   Commonly known as: PROTONIX   Take 1 tablet (40 mg total) by mouth daily.      predniSONE 10 MG tablet   Commonly known as: STERAPRED UNI-PAK   Take 1 tablet (10 mg total) by mouth  daily. Take 40mg  po daily for 2 days then 30mg  po daily for 2 days then 20mg  po daily for 2 days then 10mg  po daily for 2 days then stop           Follow-up Information    Follow up with Allie Dimmer, OTR. (follow up next week with your doctor and have blood counts rechecked since you are anemic.)    Contact information:   [address] [place] [address]  [phone]           The results of significant diagnostics from this hospitalization (including imaging, microbiology, ancillary and laboratory) are listed below for reference.    Significant Diagnostic Studies: Dg Chest Portable 1 View  10/26/2011  *RADIOLOGY REPORT*  Clinical Data: Shortness of breath, anxiety, GERD  PORTABLE CHEST - 1 VIEW  Comparison: Portable exam 1255 hours compared to 08/10/2010  Findings: Normal heart size, mediastinal contours, and pulmonary vascularity. Atherosclerotic calcification aorta. Lungs appear mildly emphysematous but clear. No pleural effusion or pneumothorax. Diffuse osseous demineralization.  IMPRESSION: Mild emphysematous changes. No acute abnormalities.   Original Report Authenticated By: Lollie Marrow, M.D.     Microbiology: No results found for this or any previous visit (from the past 240 hour(s)).   Labs: Basic Metabolic Panel:  Lab 10/27/11 9562 10/26/11 1236  NA 138 139  K 4.3 3.8  CL 107 105  CO2 20 20  GLUCOSE 137* 100*  BUN 15 26*  CREATININE 0.68 0.86  CALCIUM 8.9 9.5  MG -- 2.1  PHOS -- --   Liver Function Tests:  Lab 10/27/11 0413 10/26/11 1236  AST 16 17  ALT 13 14  ALKPHOS 54 63  BILITOT 0.2* 0.3  PROT 6.0 6.8  ALBUMIN 3.2* 3.7   No results found for this basename: LIPASE:5,AMYLASE:5 in the last 168 hours No results found for this basename: AMMONIA:5 in the last 168 hours CBC:  Lab 10/28/11 0526 10/27/11 0413 10/26/11 1236  WBC 20.8* 10.2 11.2*  NEUTROABS -- -- --  HGB 8.3* 8.7* 10.0*  HCT 25.2* 26.4* 29.9*  MCV 93.7 91.7  91.4  PLT 347 324 369   Cardiac Enzymes:  Lab 10/27/11 0412 10/26/11 2156 10/26/11 1600 10/26/11 1236  CKTOTAL -- -- -- --  CKMB -- -- -- --  CKMBINDEX -- -- -- --  TROPONINI <0.30 <0.30 <0.30 <0.30   BNP: BNP (last 3 results) No results found for this basename: PROBNP:3 in the last 8760 hours CBG:  Lab 10/28/11 1211 10/28/11 0753 10/27/11 2059 10/27/11 1715 10/27/11 1150  GLUCAP 144* 83 117* 145* 113*    Time coordinating discharge: greater than 30 minutes  Signed:  Nakeyia Shaffer  [facility] 10/28/2011, 12:25 PM

## 2012-04-01 ENCOUNTER — Other Ambulatory Visit: Payer: Self-pay

## 2012-04-01 MED ORDER — MELOXICAM 7.5 MG PO TABS: 7.5000 mg | ORAL_TABLET | Freq: Every day | ORAL | Status: DC

## 2012-05-06 ENCOUNTER — Telehealth: Payer: Self-pay | Admitting: Nurse Practitioner

## 2012-05-06 NOTE — Telephone Encounter (Signed)
APPT MADE FOR 4/29 WITH MAE MARTIN

## 2012-05-07 ENCOUNTER — Ambulatory Visit (INDEPENDENT_AMBULATORY_CARE_PROVIDER_SITE_OTHER): Payer: Medicare Other | Admitting: General Practice

## 2012-05-07 ENCOUNTER — Encounter: Payer: Self-pay | Admitting: General Practice

## 2012-05-07 VITALS — BP 172/102 | HR 99 | Temp 98.3°F | Ht 63.0 in | Wt 169.0 lb

## 2012-05-07 DIAGNOSIS — M1710 Unilateral primary osteoarthritis, unspecified knee: Secondary | ICD-10-CM

## 2012-05-07 DIAGNOSIS — L0390 Cellulitis, unspecified: Secondary | ICD-10-CM

## 2012-05-07 DIAGNOSIS — L0291 Cutaneous abscess, unspecified: Secondary | ICD-10-CM

## 2012-05-07 DIAGNOSIS — M1712 Unilateral primary osteoarthritis, left knee: Secondary | ICD-10-CM

## 2012-05-07 MED ORDER — CEPHALEXIN 500 MG PO CAPS: 500.0000 mg | ORAL_CAPSULE | Freq: Two times a day (BID) | ORAL | Status: AC

## 2012-05-07 MED ORDER — MELOXICAM 15 MG PO TABS: 15.0000 mg | ORAL_TABLET | Freq: Every day | ORAL | Status: DC

## 2012-05-07 NOTE — Progress Notes (Signed)
  Subjective:    Patient ID: [name], female    DOB: [birth year], 79 y.o.   MRN: [id number]  HPI Patient presents today with possible spider or insect bite. Onset is one month ago. Reports applying antibiotic ointment and area will begin to clear, then turn red and scab over again.      Review of Systems  Constitutional: Negative for fever and chills.  Respiratory: Negative for chest tightness and shortness of breath.   Cardiovascular: Negative for chest pain.  Genitourinary: Negative for dysuria and difficulty urinating.  Skin:       Reddened and scabbed area to left upper back  Neurological: Positive for headaches.  Psychiatric/Behavioral: Negative.        Objective:   Physical Exam  Constitutional: She appears well-developed and well-nourished.  Pulmonary/Chest: Breath sounds normal.  Skin: Skin is warm. There is erythema.  Erythema with scabbed area to left upper back 1 inch by 1 inch  Psychiatric: She has a normal mood and affect.          Assessment & Plan:  Take antibiotics as directed Keep area clean and dry RTO if symptoms worsen and in one week for follow up Patient verbalized understanding Raymon Mutton, FNP-C

## 2012-05-07 NOTE — Patient Instructions (Signed)
Cellulitis Cellulitis is an infection of the skin and the tissue beneath it. The infected area is usually red and tender. Cellulitis occurs most often in the arms and lower legs.   CAUSES   Cellulitis is caused by bacteria that enter the skin through cracks or cuts in the skin. The most common types of bacteria that cause cellulitis are Staphylococcus and Streptococcus. SYMPTOMS    Redness and warmth.   Swelling.   Tenderness or pain.   Fever.  DIAGNOSIS  Your caregiver can usually determine what is wrong based on a physical exam. Blood tests may also be done. TREATMENT   Treatment usually involves taking an antibiotic medicine. HOME CARE INSTRUCTIONS    Take your antibiotics as directed. Finish them even if you start to feel better.   Keep the infected arm or leg elevated to reduce swelling.   Apply a warm cloth to the affected area up to 4 times per day to relieve pain.   Only take over-the-counter or prescription medicines for pain, discomfort, or fever as directed by your caregiver.   Keep all follow-up appointments as directed by your caregiver.  SEEK MEDICAL CARE IF:    You notice red streaks coming from the infected area.   Your red area gets larger or turns dark in color.   Your bone or joint underneath the infected area becomes painful after the skin has healed.   Your infection returns in the same area or another area.   You notice a swollen bump in the infected area.   You develop new symptoms.  SEEK IMMEDIATE MEDICAL CARE IF:    You have a fever.   You feel very sleepy.   You develop vomiting or diarrhea.   You have a general ill feeling (malaise) with muscle aches and pains.  MAKE SURE YOU:    Understand these instructions.   Will watch your condition.   Will get help right away if you are not doing well or get worse.  Document Released: 10/05/2004 Document Revised: 06/27/2011 Document Reviewed: 03/13/2011 [employer] Patient Information 2013  [employer]    

## 2012-05-14 ENCOUNTER — Ambulatory Visit (INDEPENDENT_AMBULATORY_CARE_PROVIDER_SITE_OTHER): Payer: Medicare Other | Admitting: General Practice

## 2012-05-14 ENCOUNTER — Ambulatory Visit (INDEPENDENT_AMBULATORY_CARE_PROVIDER_SITE_OTHER): Payer: Medicare Other

## 2012-05-14 ENCOUNTER — Encounter: Payer: Self-pay | Admitting: General Practice

## 2012-05-14 VITALS — BP 159/91 | HR 93 | Temp 97.1°F | Ht 63.0 in | Wt 179.0 lb

## 2012-05-14 DIAGNOSIS — R0602 Shortness of breath: Secondary | ICD-10-CM

## 2012-05-14 LAB — COMPLETE METABOLIC PANEL WITH GFR
BUN: 11 mg/dL (ref 6–23)
CO2: 25 mEq/L (ref 19–32)
Calcium: 10.2 mg/dL (ref 8.4–10.5)
Creat: 0.96 mg/dL (ref 0.50–1.10)
GFR, Est African American: 64 mL/min
GFR, Est Non African American: 56 mL/min — ABNORMAL LOW
Glucose, Bld: 92 mg/dL (ref 70–99)
Total Bilirubin: 0.5 mg/dL (ref 0.3–1.2)

## 2012-05-14 NOTE — Progress Notes (Signed)
  Subjective:    Patient ID: [name], female    DOB: 09/20/1928, 79 y.o.   MRN: [id number]  HPI Reports today for recheck of cellulitis of right upper back area. Reports area looks and feels better. Continues to itch slightly. Reports pain in bilateral knees. Reports increased shortness of breath gradually worsening over past few days. Reports usually more short of breath at night. Reports 10 pound weight gain since 05/07/12.     Review of Systems  Constitutional: Negative for fever and chills.  Respiratory: Positive for shortness of breath. Negative for chest tightness.   Cardiovascular: Negative for chest pain and palpitations.  Genitourinary: Negative for difficulty urinating.  Skin:       pink area noted to left upper back  Neurological: Negative for dizziness and headaches.       Objective:   Physical Exam  Constitutional: She is oriented to person, place, and time. She appears well-developed and well-nourished.  Cardiovascular: Normal rate, regular rhythm and normal heart sounds.   No murmur heard. Pulmonary/Chest: No respiratory distress. She exhibits no tenderness.  Mild shortness of breath while sitting on exam table. Diminished bilateral lungs sounds in bases. Recheck pulse ox, 97% on room air. Patient has shortness of breath, but able to engage in coversation without difficulty  Musculoskeletal:  Crepitus felt with flexion and extension of knees  Neurological: She is alert and oriented to person, place, and time.  Skin: Skin is warm and dry. There is erythema.  1/4 x 1/4 pink based area with scabbed center to left upper back.   Psychiatric: She has a normal mood and affect.   [facility] reading (PRIMARY) by  Ruthell Rummage, RNP-C, no acute process, noted.                                     Assessment & Plan:  Increased Shortness of breath Chest xray Labs pending, BNP Continue current medications Discussed fall precautions Call 911 or go to nearest emergency room  if symptoms worsen

## 2012-05-14 NOTE — Patient Instructions (Addendum)

## 2012-05-15 LAB — BRAIN NATRIURETIC PEPTIDE: Brain Natriuretic Peptide: 17.9 pg/mL (ref 0.0–100.0)

## 2012-05-17 ENCOUNTER — Telehealth: Payer: Self-pay | Admitting: Family Medicine

## 2012-05-17 NOTE — Telephone Encounter (Signed)
wants her lab results

## 2012-05-17 NOTE — Telephone Encounter (Signed)
I Called results to her daughter Truddie Coco today.

## 2012-05-31 ENCOUNTER — Other Ambulatory Visit: Payer: Self-pay | Admitting: General Practice

## 2012-06-29 ENCOUNTER — Other Ambulatory Visit: Payer: Self-pay | Admitting: General Practice

## 2012-07-31 ENCOUNTER — Other Ambulatory Visit: Payer: Self-pay | Admitting: Nurse Practitioner

## 2012-07-31 MED ORDER — DULOXETINE HCL 30 MG PO CPEP: 30.0000 mg | ORAL_CAPSULE | Freq: Every day | ORAL | Status: DC

## 2012-08-01 ENCOUNTER — Other Ambulatory Visit: Payer: Self-pay

## 2012-08-01 MED ORDER — DULOXETINE HCL 30 MG PO CPEP: 30.0000 mg | ORAL_CAPSULE | Freq: Every day | ORAL | Status: DC

## 2012-08-08 ENCOUNTER — Encounter (HOSPITAL_COMMUNITY): Payer: Self-pay

## 2012-08-08 ENCOUNTER — Inpatient Hospital Stay (HOSPITAL_COMMUNITY)
Admission: EM | Admit: 2012-08-08 | Discharge: 2012-08-11 | DRG: 378 | Disposition: A | Discharge status: Home / Self Care | Payer: Medicare Other | Attending: Internal Medicine | Admitting: Internal Medicine

## 2012-08-08 DIAGNOSIS — K319 Disease of stomach and duodenum, unspecified: Secondary | ICD-10-CM | POA: Diagnosis present

## 2012-08-08 DIAGNOSIS — J439 Emphysema, unspecified: Secondary | ICD-10-CM

## 2012-08-08 DIAGNOSIS — F411 Generalized anxiety disorder: Secondary | ICD-10-CM | POA: Diagnosis present

## 2012-08-08 DIAGNOSIS — E860 Dehydration: Secondary | ICD-10-CM

## 2012-08-08 DIAGNOSIS — K921 Melena: Principal | ICD-10-CM | POA: Diagnosis present

## 2012-08-08 DIAGNOSIS — K559 Vascular disorder of intestine, unspecified: Secondary | ICD-10-CM | POA: Diagnosis present

## 2012-08-08 DIAGNOSIS — T3995XA Adverse effect of unspecified nonopioid analgesic, antipyretic and antirheumatic, initial encounter: Secondary | ICD-10-CM | POA: Diagnosis present

## 2012-08-08 DIAGNOSIS — K5520 Angiodysplasia of colon without hemorrhage: Secondary | ICD-10-CM

## 2012-08-08 DIAGNOSIS — K625 Hemorrhage of anus and rectum: Secondary | ICD-10-CM

## 2012-08-08 DIAGNOSIS — R109 Unspecified abdominal pain: Secondary | ICD-10-CM | POA: Diagnosis present

## 2012-08-08 DIAGNOSIS — D62 Acute posthemorrhagic anemia: Secondary | ICD-10-CM | POA: Diagnosis present

## 2012-08-08 DIAGNOSIS — Z87891 Personal history of nicotine dependence: Secondary | ICD-10-CM

## 2012-08-08 DIAGNOSIS — K922 Gastrointestinal hemorrhage, unspecified: Secondary | ICD-10-CM | POA: Diagnosis present

## 2012-08-08 DIAGNOSIS — M1990 Unspecified osteoarthritis, unspecified site: Secondary | ICD-10-CM | POA: Diagnosis present

## 2012-08-08 DIAGNOSIS — K449 Diaphragmatic hernia without obstruction or gangrene: Secondary | ICD-10-CM | POA: Diagnosis present

## 2012-08-08 DIAGNOSIS — D649 Anemia, unspecified: Secondary | ICD-10-CM | POA: Diagnosis present

## 2012-08-08 DIAGNOSIS — F419 Anxiety disorder, unspecified: Secondary | ICD-10-CM | POA: Diagnosis present

## 2012-08-08 DIAGNOSIS — K219 Gastro-esophageal reflux disease without esophagitis: Secondary | ICD-10-CM | POA: Diagnosis present

## 2012-08-08 DIAGNOSIS — K5790 Diverticulosis of intestine, part unspecified, without perforation or abscess without bleeding: Secondary | ICD-10-CM | POA: Diagnosis present

## 2012-08-08 DIAGNOSIS — K5732 Diverticulitis of large intestine without perforation or abscess without bleeding: Secondary | ICD-10-CM | POA: Diagnosis present

## 2012-08-08 DIAGNOSIS — R000 Tachycardia, unspecified: Secondary | ICD-10-CM | POA: Diagnosis present

## 2012-08-08 DIAGNOSIS — K5730 Diverticulosis of large intestine without perforation or abscess without bleeding: Secondary | ICD-10-CM

## 2012-08-08 DIAGNOSIS — J438 Other emphysema: Secondary | ICD-10-CM | POA: Diagnosis present

## 2012-08-08 DIAGNOSIS — K2980 Duodenitis without bleeding: Secondary | ICD-10-CM | POA: Diagnosis present

## 2012-08-08 LAB — LIPASE, BLOOD: Lipase: 58 U/L (ref 11–59)

## 2012-08-08 LAB — COMPREHENSIVE METABOLIC PANEL
ALT: 10 U/L (ref 0–35)
Albumin: 3.7 g/dL (ref 3.5–5.2)
Alkaline Phosphatase: 109 U/L (ref 39–117)
BUN: 13 mg/dL (ref 6–23)
Chloride: 105 mEq/L (ref 96–112)
Glucose, Bld: 107 mg/dL — ABNORMAL HIGH (ref 70–99)
Potassium: 3.8 mEq/L (ref 3.5–5.1)
Sodium: 139 mEq/L (ref 135–145)
Total Bilirubin: 0.2 mg/dL — ABNORMAL LOW (ref 0.3–1.2)
Total Protein: 7 g/dL (ref 6.0–8.3)

## 2012-08-08 LAB — CBC
Hemoglobin: 12.2 g/dL (ref 12.0–15.0)
MCH: 29.5 pg (ref 26.0–34.0)
MCHC: 31.8 g/dL (ref 30.0–36.0)
MCV: 91.3 fL (ref 78.0–100.0)
MCV: 92.2 fL (ref 78.0–100.0)
Platelets: 365 10*3/uL (ref 150–400)
RBC: 4.14 MIL/uL (ref 3.87–5.11)
RBC: 3.58 MIL/uL — ABNORMAL LOW (ref 3.87–5.11)
RDW: 14.4 % (ref 11.5–15.5)
WBC: 10.2 10*3/uL (ref 4.0–10.5)

## 2012-08-08 LAB — CBC WITH DIFFERENTIAL/PLATELET
Basophils Relative: 0 % (ref 0–1)
Hemoglobin: 13.4 g/dL (ref 12.0–15.0)
Lymphs Abs: 1.1 10*3/uL (ref 0.7–4.0)
Monocytes Relative: 9 % (ref 3–12)
Neutro Abs: 5.3 10*3/uL (ref 1.7–7.7)
Neutrophils Relative %: 74 % (ref 43–77)
Platelets: 402 10*3/uL — ABNORMAL HIGH (ref 150–400)
RBC: 4.54 MIL/uL (ref 3.87–5.11)

## 2012-08-08 LAB — MRSA PCR SCREENING: MRSA by PCR: NEGATIVE

## 2012-08-08 LAB — TYPE AND SCREEN
ABO/RH(D): O POS
Antibody Screen: NEGATIVE

## 2012-08-08 LAB — APTT: aPTT: 32 seconds (ref 24–37)

## 2012-08-08 MED ORDER — ACETAMINOPHEN 325 MG PO TABS
650.0000 mg | ORAL_TABLET | Freq: Four times a day (QID) | ORAL | Status: DC | PRN
  Administered 2012-08-08 – 2012-08-09 (×2): 650 mg via ORAL
  Filled 2012-08-08 (×2): qty 2

## 2012-08-08 MED ORDER — PANTOPRAZOLE SODIUM 40 MG PO TBEC
40.0000 mg | DELAYED_RELEASE_TABLET | Freq: Every day | ORAL | Status: DC
  Administered 2012-08-08 – 2012-08-11 (×4): 40 mg via ORAL
  Filled 2012-08-08 (×4): qty 1

## 2012-08-08 MED ORDER — ALUM & MAG HYDROXIDE-SIMETH 200-200-20 MG/5ML PO SUSP: 30.0000 mL | Freq: Four times a day (QID) | ORAL | Status: DC | PRN

## 2012-08-08 MED ORDER — SODIUM CHLORIDE 0.9 % IV BOLUS (SEPSIS)
1000.0000 mL | Freq: Once | INTRAVENOUS | Status: AC
  Administered 2012-08-08: 1000 mL via INTRAVENOUS

## 2012-08-08 MED ORDER — ONDANSETRON HCL 4 MG PO TABS: 4.0000 mg | ORAL_TABLET | Freq: Four times a day (QID) | ORAL | Status: DC | PRN

## 2012-08-08 MED ORDER — ACETAMINOPHEN 650 MG RE SUPP: 650.0000 mg | Freq: Four times a day (QID) | RECTAL | Status: DC | PRN

## 2012-08-08 MED ORDER — SODIUM CHLORIDE 0.9 % IV SOLN
INTRAVENOUS | Status: DC
  Administered 2012-08-08 – 2012-08-09 (×2): via INTRAVENOUS

## 2012-08-08 MED ORDER — ALBUTEROL SULFATE HFA 108 (90 BASE) MCG/ACT IN AERS: 2.0000 | INHALATION_SPRAY | RESPIRATORY_TRACT | Status: DC | PRN

## 2012-08-08 MED ORDER — HYDROMORPHONE HCL PF 1 MG/ML IJ SOLN
0.5000 mg | INTRAMUSCULAR | Status: DC | PRN
  Administered 2012-08-08 – 2012-08-10 (×5): 0.5 mg via INTRAVENOUS
  Filled 2012-08-08 (×6): qty 1

## 2012-08-08 MED ORDER — ONDANSETRON HCL 4 MG/2ML IJ SOLN: 4.0000 mg | Freq: Four times a day (QID) | INTRAMUSCULAR | Status: DC | PRN

## 2012-08-08 MED ORDER — SODIUM CHLORIDE 0.9 % IV SOLN: INTRAVENOUS | Status: AC

## 2012-08-08 MED ORDER — TRAZODONE HCL 50 MG PO TABS
25.0000 mg | ORAL_TABLET | Freq: Every evening | ORAL | Status: DC | PRN
  Administered 2012-08-09: 25 mg via ORAL
  Filled 2012-08-08: qty 1

## 2012-08-08 NOTE — Progress Notes (Signed)
Dr Jena Gauss paged and made aware of consult. Per Dr Jena Gauss ok for pt to have a clear liquid diet. Will continue to monitor.

## 2012-08-08 NOTE — ED Notes (Signed)
Bloody bowel movement with clots  

## 2012-08-08 NOTE — ED Notes (Addendum)
The patient is tachypnea and very anxious, states that hospitals make her very nervous.  The patient is diaphoretic at times, tachycardic, appears slightly pale, however family at bedside states that the patient's color is normal for her.

## 2012-08-08 NOTE — ED Notes (Signed)
Pt c/o lower abd pain and swelling for the past couple of months.  This morning woke up and had BM and saw bright red blood in commode.

## 2012-08-08 NOTE — ED Notes (Signed)
Pt ambulatory to restroom with permission of NP Toya Smothers per Actor at bedside Barbara Cower).  Pt became very diaphoretic and felt like she was going to pass out, immediatly assisted back to bed.

## 2012-08-08 NOTE — Consult Note (Signed)
Referring Provider: Toya Smothers, NP/Hospitalist Service Primary Care Physician:  Rudi Heap, MD Primary Gastroenterologist:  Dr. Darrick Penna   Date of Admission: 08/08/12 Date of Consultation: 08/08/12  Reason for Consultation:  Rectal bleeding  HPI:   80 year old pleasant female presented to the emergency room this morning with acute onset of rectal bleeding. Notes waking this morning with sensation of needing to have a BM, followed by bright red blood per rectum. Per notes by hospitalist, she had 3 separate episodes while in th ED, one which was witnessed.  She notes chronic lower abdominal pain/RLQ vague discomfort for several months, worsening now. Abdominal cramping precedes bloody stool. Improvement after BM. Feels like lower abdominal pain is slightly worse since admission. Orthostatic vital signs noted. Upon admission, Hgb 13.4 with drop to 12.2 after several hours.   Outpatient medications include Mobic. Patient denies aspirin powders. Last colonoscopy in October 2011 by Dr. Darrick Penna: pancolonic diverticulosis, 2 cecal AVMs s/p ablation, retained stool in left colon, moderate internal hemorrhoids.     Past Medical History  Diagnosis Date  . Emphysema   . GERD (gastroesophageal reflux disease)   . Depression   . Anxiety   . Anemia   . DJD (degenerative joint disease)   . Diverticulosis 10/2009    Pandiverticulosis, per colonoscopy, Dr. Darrick Penna  . Acute diverticulitis   . AVM (arteriovenous malformation) of colon   . Internal hemorrhoids   . Rectal bleeding     Secondary to diverticulosis, hemorrhoids, and/or cecal AVMs    Past Surgical History  Procedure Laterality Date  . Right total knee arthroplasty      X2  . Colonoscopy  Oct 2011    Dr. Darrick Penna: pancolonic diverticulosis, 2 cecal AVMs s/p ablation, retained stool in left colon, moderate internal hemorrhoids    Prior to Admission medications   Medication Sig Start Date End Date Taking? Authorizing Provider  aspirin  EC 81 MG tablet Take 81 mg by mouth at bedtime.   Yes Historical Provider, MD  doxycycline (VIBRA-TABS) 100 MG tablet Take 1 tablet (100 mg total) by mouth 2 (two) times daily. 10/28/11  Yes Erick Blinks, MD  DULoxetine (CYMBALTA) 30 MG capsule Take 1 capsule (30 mg total) by mouth daily. 07/31/12  Yes Mae Shelda Altes, FNP  iron polysaccharides (FERREX 150) 150 MG capsule Take 150 mg by mouth 2 (two) times a week. Takes on Tuesdays and Thursdays   Yes Historical Provider, MD  meclizine (ANTIVERT) 25 MG tablet TAKE 1 TABLET BY MOUTH TWICE A DAY 07/31/12  Yes Mae Shelda Altes, FNP  meloxicam (MOBIC) 15 MG tablet TAKE 1 TABLET (15 MG TOTAL) BY MOUTH DAILY. 06/29/12  Yes Ernestina Penna, MD  pantoprazole (PROTONIX) 40 MG tablet TAKE 1 TABLET BY MOUTH ONCE DAILY 07/31/12  Yes Mae Shelda Altes, FNP  albuterol (PROVENTIL HFA;VENTOLIN HFA) 108 (90 BASE) MCG/ACT inhaler Inhale 2 puffs into the lungs every 4 (four) hours as needed for wheezing. 10/28/11   Erick Blinks, MD    Current Facility-Administered Medications  Medication Dose Route Frequency Provider Last Rate Last Dose  . 0.9 %  sodium chloride infusion   Intravenous STAT [name], MD      . 0.9 %  sodium chloride infusion   Intravenous Continuous Gwenyth Bender, NP 125 mL/hr at 08/08/12 1300    . acetaminophen (TYLENOL) tablet 650 mg  650 mg Oral Q6H PRN Gwenyth Bender, NP       Or  . acetaminophen (TYLENOL) suppository 650 mg  650 mg Rectal Q6H PRN Gwenyth Bender, NP      . albuterol (PROVENTIL HFA;VENTOLIN HFA) 108 (90 BASE) MCG/ACT inhaler 2 puff  2 puff Inhalation Q4H PRN Gwenyth Bender, NP      . alum & mag hydroxide-simeth (MAALOX/MYLANTA) 200-200-20 MG/5ML suspension 30 mL  30 mL Oral Q6H PRN Gwenyth Bender, NP      . HYDROmorphone (DILAUDID) injection 0.5 mg  0.5 mg Intravenous Q2H PRN Gwenyth Bender, NP   0.5 mg at 08/08/12 1300  . ondansetron (ZOFRAN) tablet 4 mg  4 mg Oral Q6H PRN Gwenyth Bender, NP       Or  . ondansetron [facility])  injection 4 mg  4 mg Intravenous Q6H PRN Gwenyth Bender, NP      . traZODone (DESYREL) tablet 25 mg  25 mg Oral QHS PRN Gwenyth Bender, NP        Allergies as of 08/08/2012 - Review Complete 08/08/2012  Allergen Reaction Noted  . Avelox (moxifloxacin hcl in nacl)  10/26/2011  . Codeine Nausea And Vomiting   . Cortisone Rash 10/26/2011    Family History  Problem Relation Age of Onset  . Asthma Mother   . Colon cancer Neg Hx     History   Social History  . Marital Status: Married    Spouse Name: N/A    Number of Children: N/A  . Years of Education: N/A   Occupational History  . Not on file.   Social History Main Topics  . Smoking status: Former Smoker -- 1.00 packs/day for 65 years    Quit date: 10/26/2006  . Smokeless tobacco: Not on file  . Alcohol Use: No  . Drug Use: No  . Sexually Active: Not on file   Other Topics Concern  . Not on file   Social History Narrative  . No narrative on file    Review of Systems: Negative unless mentioned in HPI.   Physical Exam: Vital signs in last 24 hours: Temp:  [97.9 F (36.6 C)-98.1 F (36.7 C)] 97.9 F (36.6 C) (07/31 1104) Pulse Rate:  [87-125] 87 (07/31 1300) Resp:  [16-32] 18 (07/31 1104) BP: (102-152)/(69-110) 110/85 mmHg (07/31 1300) SpO2:  [96 %-100 %] 100 % (07/31 1300) Weight:  [169 lb 12.1 oz (77 kg)] 169 lb 12.1 oz (77 kg) (07/31 1101) Last BM Date: 08/08/12 (blood stools) General:   Alert,  Well-developed, well-nourished, pleasant and cooperative in NAD Head:  Normocephalic and atraumatic. Eyes:  Sclera clear, no icterus.   Conjunctiva pink. Ears:  Normal auditory acuity. Nose:  No deformity, discharge,  or lesions. Mouth:  No deformity or lesions, dentition normal. Lungs:  Clear throughout to auscultation.   No wheezes, crackles, or rhonchi. No acute distress. Heart:  S1 S2 present Abdomen:  Soft, mild diffuse tenderness to palpation and nondistended. No masses, hepatosplenomegaly or hernias noted.  Normal bowel sounds Rectal:  Deferred  Msk:  Symmetrical without gross deformities. Normal posture. Extremities:  Without clubbing or edema. Neurologic:  Alert and  oriented x4;  grossly normal neurologically. Skin:  Intact without significant lesions or rashes. Psych:  Alert and cooperative. Normal mood and affect.  Intake/Output from previous day:   Intake/Output this shift: Total I/O In: 287.5 [P.O.:100; I.V.:187.5] Out: -   Lab Results:  Recent Labs  08/08/12 0818 08/08/12 1105  WBC 7.1 10.5  HGB 13.4 12.2  HCT 41.1 37.8  PLT 402* 405*   BMET  Recent Labs  08/08/12 0818  NA 139  K 3.8  CL 105  CO2 24  GLUCOSE 107*  BUN 13  CREATININE 0.80  CALCIUM 9.1   LFT  Recent Labs  08/08/12 0818  PROT 7.0  ALBUMIN 3.7  AST 17  ALT 10  ALKPHOS 109  BILITOT 0.2*   PT/INR  Recent Labs  08/08/12 0818  LABPROT 13.4  INR 1.04   Impression: 79 year old female admitted with acute onset of rectal bleeding and abdominal cramping, suspicious for ischemic colitis. Mild drop in Hgb but still remains normal; as an outpatient, she was taking Mobic but no other NSAIDs or blood thinners. Last colonoscopy in Oct 2011 by Dr. Darrick Penna with pancolonic diverticulosis, 2 cecal AVMs s/p ablation, retained stool in left colon, moderate internal hemorrhoids. Differentials could include diverticular or AVM as source of rectal bleeding, but ischemic colitis is more likely in this scenario.  Recommend supportive measures, hydration, serial Hgb/Hct, with reassessment in the morning. Will hold off on pursuing a colonoscopy at this point unless rectal bleeding is persistent and/or significant acute blood loss anemia develops. Anticipate tapering of rectal bleeding without invasive measures at this point. Will reassess in the morning; if necessary, she could have a colonoscopy tomorrow afternoon if warranted.    Plan: Clear liquids Supportive care PPI for GI prophylaxis Serial  Hgb/Hct Reassess in am  Nira Retort, ANP-BC [facility]  2:06 PM    LOS: 0 days    08/08/2012,    Attending note  As above. No rectal bleeding today. Hemoglobin 12.4 - mid-day. Ischemic or segmental colitis most likely diagnosis. As long as no further bleeding, invasive studies will not be needed this hospitalization. We'll reassess tomorrow morning.

## 2012-08-08 NOTE — ED Notes (Signed)
Pt in rest room.   Denies dizziness, weakness, or lightheadedness.

## 2012-08-08 NOTE — H&P (Signed)
[facility] History and Physical  [name] FAO:[id number] DOB: 1928/02/26 DOA: 08/08/2012  Referring physician:  PCP: Rudi Heap, MD  Specialists:   Chief Complaint: bloody stool  HPI: [name] is a 79 y.o. female with a past medical history significant for COPD, anxiety, and degenerative joint disease, GERD, colonic AVM and diverticulosis presents to the emergency room with the chief complaint of bloody stool. Patient states that she awakened this morning and had brief abdominal cramping in her lower abdomen and she also felt bloated. She had a bowel movement consisted of loose brown stool and bright red blood. She denies any clots. Associated symptoms include generalized weakness over the last 2 days, worsening shortness of breath. She denies any nausea vomiting constipation. She denies any chest pain palpitations headache dizziness. She indicates that she struggles with vertigo and that is at baseline. She denies any syncope or near-syncope. She denies taking any blood thinners but she has been taking mobic for several years. Vital signs in the emergency department or significant for heart rate of 125, lab work unremarkable. While in the emergency room patient had 3 episodes one of which witnessed by me of moderate to large amounts of bright red blood with loose brown stool. Symptoms came on suddenly have persisted and worsened are characterized as severe. [facility] are asked to admit  Review of Systems: The patient denies anorexia, fever, weight loss,, vision loss, decreased hearing, hoarseness, chest pain, syncope,  peripheral edema, balance deficits, hemoptysis,  melena, severe indigestion/heartburn, hematuria, incontinence, genital sores, muscle weakness, suspicious skin lesions, transient blindness, difficulty walking, nusual weight change,  enlarged lymph nodes, angioedema, and breast masses.    Past Medical History  Diagnosis Date  . Emphysema   . GERD  (gastroesophageal reflux disease)   . Depression   . Anxiety   . Anemia   . DJD (degenerative joint disease)   . Diverticulosis 10/2009    Pandiverticulosis, per colonoscopy, Dr. Darrick Penna  . Acute diverticulitis   . AVM (arteriovenous malformation) of colon   . Internal hemorrhoids   . Rectal bleeding     Secondary to diverticulosis, hemorrhoids, and/or cecal AVMs   Past Surgical History  Procedure Laterality Date  . Right total knee arthroplasty     Social History:  reports that she quit smoking about 5 years ago. She does not have any smokeless tobacco history on file. She reports that she does not drink alcohol or use illicit drugs. Patient lives alone the right next door to her daughter and her granddaughter. She is independent with ADLs however she leads a very infrequently because she "is just not interested in going out" Allergies  Allergen Reactions  . Avelox (Moxifloxacin Hcl In Nacl)   . Codeine Nausea And Vomiting  . Cortisone Rash    Family History  Problem Relation Age of Onset  . Asthma Mother    family medical history reviewed and not pertinent to the admission of this elderly lady  Prior to Admission medications   Medication Sig Start Date End Date Taking? Authorizing Provider  aspirin EC 81 MG tablet Take 81 mg by mouth at bedtime.   Yes Historical Provider, MD  doxycycline (VIBRA-TABS) 100 MG tablet Take 1 tablet (100 mg total) by mouth 2 (two) times daily. 10/28/11  Yes Erick Blinks, MD  DULoxetine (CYMBALTA) 30 MG capsule Take 1 capsule (30 mg total) by mouth daily. 07/31/12  Yes Mae Shelda Altes, FNP  iron polysaccharides (FERREX 150) 150 MG capsule Take  150 mg by mouth 2 (two) times a week. Takes on Tuesdays and Thursdays   Yes Historical Provider, MD  meclizine (ANTIVERT) 25 MG tablet TAKE 1 TABLET BY MOUTH TWICE A DAY 07/31/12  Yes Mae Shelda Altes, FNP  meloxicam (MOBIC) 15 MG tablet TAKE 1 TABLET (15 MG TOTAL) BY MOUTH DAILY. 06/29/12  Yes Ernestina Penna,  MD  pantoprazole (PROTONIX) 40 MG tablet TAKE 1 TABLET BY MOUTH ONCE DAILY 07/31/12  Yes Mae Shelda Altes, FNP  albuterol (PROVENTIL HFA;VENTOLIN HFA) 108 (90 BASE) MCG/ACT inhaler Inhale 2 puffs into the lungs every 4 (four) hours as needed for wheezing. 10/28/11   Erick Blinks, MD   Physical Exam: Filed Vitals:   08/08/12 0945 08/08/12 1000 08/08/12 1015 08/08/12 1030  BP: 137/98 123/82 131/85 136/79  Pulse:  117 115 118  Temp:      TempSrc:      Resp: 32 25 22 16   SpO2:  99% 99% 99%     General:  Obese somewhat pale no acute distress  Eyes: PE RRL, EOMI scleral icterus  ENT: Ears clear nose without drainage mucous membranes of her mouth are slightly pale somewhat dry  Neck: Supple no JVD full range of motion no lymphadenopathy  Cardiovascular: Tachycardic but regular no murmur gallop or rub no lower extremity edema pedal pulses present and palpable  Respiratory: Normal effort breath sounds slightly distant but clear no wheeze no rhonchi  Abdomen: Obese nondistended soft positive bowel sounds throughout she exhibits mild tenderness in the lower Corder it's to palpation no mass no organomegaly no  Skin: Slightly cool but dry no rash no lesions  Musculoskeletal: No clubbing no cyanosis moves all extremities  Psychiatric: Slightly anxious but cooperative  Neurologic: Cranial nerves II through XII grossly intact she is oriented to person place and time  Labs on Admission:  Basic Metabolic Panel:  Recent Labs Lab 08/08/12 0818  NA 139  K 3.8  CL 105  CO2 24  GLUCOSE 107*  BUN 13  CREATININE 0.80  CALCIUM 9.1   Liver Function Tests:  Recent Labs Lab 08/08/12 0818  AST 17  ALT 10  ALKPHOS 109  BILITOT 0.2*  PROT 7.0  ALBUMIN 3.7    Recent Labs Lab 08/08/12 0818  LIPASE 58   No results found for this basename: AMMONIA,  in the last 168 hours CBC:  Recent Labs Lab 08/08/12 0818  WBC 7.1  NEUTROABS 5.3  HGB 13.4  HCT 41.1  MCV 90.5  PLT 402*    Cardiac Enzymes:  Recent Labs Lab 08/08/12 0818  TROPONINI <0.30    BNP (last 3 results) No results found for this basename: PROBNP,  in the last 8760 hours CBG: No results found for this basename: GLUCAP,  in the last 168 hours  Radiological Exams on Admission: No results found.  EKG: Independently reviewed. Sinus tachycardia with nonspecific ST abnormality  Assessment/Plan Principal Problem:   Hematochezia: In patient with history of diverticulosis and colonic AVM. Will admit to step down unit. Patient had 3 episodes during her time in the emergency room. Will get serial CBCs. We'll provide supportive care via IV fluids. Will type and screen in anticipation of transfusion. In the currently 13. Patient is n.p.o. and GI has been called for consultation. Patient is hemodynamically stable at this time. Active Problems: GI bleed: See #1. Will hold Mobic and aspirin.    Anemia: Currently hemoglobin is 13. Patient has had 3 episodes of hematochezia today. Chart review indicates  baseline around 9-10. Patient has been typed and screened in anticipation of any transfusion she may need. Get serial CBCs.  Tachycardia: Likely related to #1. Will continue vigorous IV fluids. Will monitor on step down. Patient has no chest pain. EKG with sinus tachycardia and nonspecific ST     AVM (arteriovenous malformation) of colon: History of same. See #1. Have requested GI consult    Diverticulosis: History of same. See #1. Request a GI    Anxiety: Appears stable at baseline.   GERD (gastroesophageal reflux disease): Patient currently n.p.o. Will continue PPI once diet resumed.    Abdominal  pain, other specified site: Related to #1. Provide pain medicine needed.     Dr. Jena Gauss with gastroenterology on call. His office has been contacted and a request for consult made.  Code Status:  Family Communication: Daughter and granddaughter at bedside Disposition Plan: Home when ready  Time spent: 54  minutes  Gwenyth Bender [facility] Pager [phone]  If 7PM-7AM, please contact night-coverage [email or web address] Password [facility] 08/08/2012, 11:01 AM  Attending note:  Patient seen and examined.  Above note reviewed and agree.  She has been admitted with hematochezia.  GI input reviewed and appreciated.  She will be monitored in step down unit due to associated tachycardia.  Serial CBCs. Possible colonoscopy if bleeding persists vs. Significant anemia. Etiologies include possible ischemic colitis vs. Diverticular bleed.  Will continue to follow closely.  Keisean Skowron

## 2012-08-08 NOTE — ED Provider Notes (Addendum)
CSN: [id number]     Arrival date & time 08/08/12  7829 History    This chart was scribed for Amy B. Bernette Mayers, MD by Leone Payor, ED Scribe. This patient was seen in room APA14/APA14 and the patient's care was started 7:49 AM.   First MD Initiated Contact with Patient 08/08/12 [phone]     No chief complaint on file.   The history is provided by the patient. No language interpreter was used.    HPI Comments: [name] is a 79 y.o. female who presents to the Emergency Department complaining of a new episode of rectal bleeding that started this morning. Pt states that she had some abdominal discomfort yesterday and through the night. She had a BM this morning with a large amount of bright red blood. She has associated dizziness she attributes to prior vertigo but no lightheadedness, CP SOB or syncope. She denies taking any blood thinners. Known history of diverticulosis and colonic AVM.   PCP Ignacia Bayley  Past Medical History  Diagnosis Date  . Emphysema   . GERD (gastroesophageal reflux disease)   . Depression   . Anxiety   . Anemia   . DJD (degenerative joint disease)   . Diverticulosis 10/2009    Pandiverticulosis, per colonoscopy, Dr. Darrick Penna  . Acute diverticulitis   . AVM (arteriovenous malformation) of colon   . Internal hemorrhoids   . Rectal bleeding     Secondary to diverticulosis, hemorrhoids, and/or cecal AVMs   Past Surgical History  Procedure Laterality Date  . Right total knee arthroplasty     Family History  Problem Relation Age of Onset  . Asthma Mother    History  Substance Use Topics  . Smoking status: Former Smoker -- 1.00 packs/day for 65 years    Quit date: 10/26/2006  . Smokeless tobacco: Not on file  . Alcohol Use: No   OB History   Grav Para Term Preterm Abortions TAB SAB Ect Mult Living                 Review of Systems  Gastrointestinal: Positive for abdominal pain and anal bleeding.  Neurological: Positive for dizziness.   All other systems reviewed and are negative.    Allergies  Avelox; Codeine; and Cortisone  Home Medications   Current Outpatient Rx  Name  Route  Sig  Dispense  Refill  . acetaminophen (TYLENOL) 500 MG tablet   Oral   Take 500 mg by mouth every 6 (six) hours as needed. For pain         . albuterol (PROVENTIL HFA;VENTOLIN HFA) 108 (90 BASE) MCG/ACT inhaler   Inhalation   Inhale 2 puffs into the lungs every 4 (four) hours as needed for wheezing.   1 Inhaler   2   . doxycycline (VIBRA-TABS) 100 MG tablet   Oral   Take 1 tablet (100 mg total) by mouth 2 (two) times daily.   6 tablet   0   . DULoxetine (CYMBALTA) 30 MG capsule   Oral   Take 1 capsule (30 mg total) by mouth daily.   30 capsule   0   . DULoxetine (CYMBALTA) 30 MG capsule   Oral   Take 1 capsule (30 mg total) by mouth daily.   30 capsule   0   . FERREX 150 150 MG capsule      TAKE 1 CAPSULE TWICE A DAY   60 capsule   0   . guaiFENesin (MUCINEX) 600 MG 12  hr tablet   Oral   Take 2 tablets (1,200 mg total) by mouth 2 (two) times daily.   14 tablet   0   . meclizine (ANTIVERT) 25 MG tablet      TAKE 1 TABLET BY MOUTH TWICE A DAY   60 tablet   3   . meloxicam (MOBIC) 15 MG tablet      TAKE 1 TABLET (15 MG TOTAL) BY MOUTH DAILY.   30 tablet   1   . pantoprazole (PROTONIX) 40 MG tablet      TAKE 1 TABLET BY MOUTH ONCE DAILY   30 tablet   3    BP 120/81  Pulse 107  Temp(Src) 98.1 F (36.7 C) (Oral)  Resp 21  SpO2 99% Physical Exam  Nursing note and vitals reviewed. Constitutional: She is oriented to person, place, and time. She appears well-developed and well-nourished.  HENT:  Head: Normocephalic and atraumatic.  Eyes: EOM are normal. Pupils are equal, round, and reactive to light.  Neck: Normal range of motion. Neck supple.  Cardiovascular: Normal heart sounds and intact distal pulses.  Tachycardia present.   Pulmonary/Chest: Effort normal and breath sounds normal.   Abdominal: Bowel sounds are normal. She exhibits no distension. There is tenderness. There is no guarding.  Diffuse abdominal tenderness. No guarding.   Musculoskeletal: Normal range of motion. She exhibits no edema and no tenderness.  Neurological: She is alert and oriented to person, place, and time. She has normal strength. No cranial nerve deficit or sensory deficit.  Skin: Skin is warm and dry. No rash noted.  Psychiatric: She has a normal mood and affect.    ED Course   Procedures (including critical care time)  DIAGNOSTIC STUDIES: Oxygen Saturation is 99% on RA, normal by my interpretation.    COORDINATION OF CARE: 7:55 AM Discussed treatment plan with pt at bedside and pt agreed to plan.   Labs Reviewed  CBC WITH DIFFERENTIAL - Abnormal; Notable for the following:    Platelets 402 (*)    All other components within normal limits  COMPREHENSIVE METABOLIC PANEL - Abnormal; Notable for the following:    Glucose, Bld 107 (*)    Total Bilirubin 0.2 (*)    GFR calc non Af Amer 67 (*)    GFR calc Af Amer 77 (*)    All other components within normal limits  LIPASE, BLOOD  PROTIME-INR  APTT  TROPONIN I  TYPE AND SCREEN   No results found. 1. Rectal bleeding     MDM  Pt had large bloody BM in the ED bathroom witnessed by me. HR initially elevated while moving from wheelchair to bed but has since improved, given IVF bolus. BP has not been low. She has normal Hgb, admit for further eval.   I personally performed the services described in this documentation, which was scribed in my presence. The recorded information has been reviewed and is accurate.     Date: 08/08/2012  Rate: 117  Rhythm: sinus tachycardia  QRS Axis: normal  Intervals: normal  ST/T Wave abnormalities: nonspecific ST/T changes  Conduction Disutrbances:none  Narrative Interpretation:   Old EKG Reviewed: changes noted, rate related ST changes   Amy B. Bernette Mayers, MD 08/08/12 1610  Bonnita Levan.  Bernette Mayers, MD 08/08/12 1023

## 2012-08-08 NOTE — ED Notes (Signed)
Bloody bowel movement with clots

## 2012-08-09 DIAGNOSIS — D62 Acute posthemorrhagic anemia: Secondary | ICD-10-CM | POA: Diagnosis present

## 2012-08-09 DIAGNOSIS — K219 Gastro-esophageal reflux disease without esophagitis: Secondary | ICD-10-CM

## 2012-08-09 LAB — CBC
Hemoglobin: 9.4 g/dL — ABNORMAL LOW (ref 12.0–15.0)
MCH: 29.7 pg (ref 26.0–34.0)
MCHC: 32.5 g/dL (ref 30.0–36.0)
Platelets: 325 10*3/uL (ref 150–400)
RBC: 3.17 MIL/uL — ABNORMAL LOW (ref 3.87–5.11)

## 2012-08-09 MED ORDER — MECLIZINE HCL 12.5 MG PO TABS
25.0000 mg | ORAL_TABLET | Freq: Two times a day (BID) | ORAL | Status: DC
  Administered 2012-08-09 – 2012-08-11 (×5): 25 mg via ORAL
  Filled 2012-08-09 (×5): qty 2

## 2012-08-09 MED ORDER — DULOXETINE HCL 30 MG PO CPEP
30.0000 mg | ORAL_CAPSULE | Freq: Every day | ORAL | Status: DC
  Administered 2012-08-09 – 2012-08-11 (×3): 30 mg via ORAL
  Filled 2012-08-09 (×3): qty 1

## 2012-08-09 NOTE — Progress Notes (Signed)
Notified Dr. Kerry Hough that pt was complaining of ear pain. She states that she uses "Camphor Oil" at home for this, this is not in our formulary. Dr. Kerry Hough states that if pt wants to use her own from home, that she is welcome to do so. Pt & her family were made aware of this. Sheryn Bison

## 2012-08-09 NOTE — Progress Notes (Signed)
Patient had large, formed BM. Mostly brown but some shades of dark red present. No visible indicators of active bleed.

## 2012-08-09 NOTE — Care Management Note (Signed)
    Page 1 of 1   08/09/2012     3:10:00 PM   CARE MANAGEMENT NOTE 08/09/2012  Patient:  [name], [name]   Account Number:  [id number]  Date Initiated:  08/09/2012  Documentation initiated by:  Sharrie Rothman  Subjective/Objective Assessment:   Pt admitted from home with gi bleeding. Pt lives with her husband and is fairly independent with ADL's. Pt has a cane and walker for home use. Pt denies any need for HH at this time. Pt has a daughter who lives next door and many other     Action/Plan:   family members who are very active in the care of the pt. No CM needs noted.   Anticipated DC Date:  08/11/2012   Anticipated DC Plan:  HOME/SELF CARE      DC Planning Services  CM consult      Choice offered to / List presented to:             Status of service:  Completed, signed off Medicare Important Message given?   (If response is "NO", the following Medicare IM given date fields will be blank) Date Medicare IM given:   Date Additional Medicare IM given:    Discharge Disposition:  HOME/SELF CARE  Per UR Regulation:    If discussed at Long Length of Stay Meetings, dates discussed:    Comments:  08/09/12 1505 Arlyss Queen, RN BSN CM

## 2012-08-09 NOTE — Progress Notes (Signed)
pts family expressed concerns about pt not receiving daily medications. I notified them that certain medications from her home list were unable to start due to bleed, but that I would check with her physician and see what could be restarted. Dr. Kerry Hough paged, orders placed to restart Antivert and Cymbalta. Sheryn Bison

## 2012-08-09 NOTE — Progress Notes (Signed)
Subjective: Feels much improved since admission. No further rectal bleeding since arriving in the ICU. Tolerating clear liquids.   Objective: Vital signs in last 24 hours: Temp:  [97.9 F (36.6 C)-98.4 F (36.9 C)] 98.2 F (36.8 C) (08/01 0400) Pulse Rate:  [78-125] 78 (08/01 0100) Resp:  [15-32] 20 (08/01 0600) BP: (102-153)/(63-110) 126/67 mmHg (08/01 0600) SpO2:  [96 %-100 %] 98 % (08/01 0400) Weight:  [169 lb 12.1 oz (77 kg)-182 lb 8.7 oz (82.8 kg)] 182 lb 8.7 oz (82.8 kg) (08/01 0400) Last BM Date: 08/08/12 General:   Alert and oriented, pleasant Head:  Normocephalic and atraumatic. Eyes:  No icterus, sclera clear. Conjuctiva pink.  Heart:  S1, S2 present, no murmurs noted.  Lungs: Clear to auscultation bilaterally, without wheezing, rales, or rhonchi.  Abdomen:  Bowel sounds present, soft, very mild tenderness to palpation lower abdomen, non-distended. Extremities:  Without clubbing or edema.  Intake/Output from previous day: 07/31 0701 - 08/01 0700 In: 2036.7 [P.O.:340; I.V.:1696.7] Out: 500 [Urine:500] Intake/Output this shift:    Lab Results:  Recent Labs  08/08/12 0818 08/08/12 1105 08/08/12 1746  WBC 7.1 10.5 10.2  HGB 13.4 12.2 10.5*  HCT 41.1 37.8 33.0*  PLT 402* 405* 365   BMET  Recent Labs  08/08/12 0818  NA 139  K 3.8  CL 105  CO2 24  GLUCOSE 107*  BUN 13  CREATININE 0.80  CALCIUM 9.1   LFT  Recent Labs  08/08/12 0818  PROT 7.0  ALBUMIN 3.7  AST 17  ALT 10  ALKPHOS 109  BILITOT 0.2*   PT/INR  Recent Labs  08/08/12 0818  LABPROT 13.4  INR 1.04   Assessment: 79 year old female admitted with acute onset of rectal bleeding, likely dealing with ischemic colitis, now with resolution of bleeding and clinically improved from admission. Admitting Hgb 13.4, drifting to 10.5 but likely multifactorial secondary to acute blood loss and dilutional component. It is reassuring that she has had no further evidence of lower GI bleeding. Other  differentials, which are less likely, include diverticular or AVM origin. Without evidence of continued bleeding, would recommend continued supportive measures and hold off on colonoscopy at this point.   Plan: Monitor for further rectal bleeding: if recurs, consider colonoscopy CBC in am Clear liquid diet for now, if remains stable may consider advancing this evening PPI for GI prophylaxis Avoid NSAIDs indefinitely if possible  Nira Retort, ANP-BC [facility]  8:09 AM   LOS: 1 day    08/09/2012

## 2012-08-09 NOTE — Progress Notes (Signed)
Report called and given to Reesa Chew, RN. Patient alert, oriented and in stable condition at the time of transport. Patient and patient's belongings transported to room 331.

## 2012-08-09 NOTE — Progress Notes (Signed)
[facility] PROGRESS NOTE  [name]:[id number] DOB: 1928/02/12 DOA: 08/08/2012 PCP: Rudi Heap, MD  Assessment/Plan: Hematochezia: No further episodes. Hg trending down. Currently 10.5. Asymptomatic.  We'll continue. supportive care via IV fluids. Clear liquids per GI. Evaluated by GI who opined likely ischemic colitis. Recommending continuation of supportive care and no plans for colonoscopy at this point.  Patient is hemodynamically stable at this time. Will transfer to telemetry.   Active Problems:  GI bleed: See #1. Will hold Mobic and aspirin.    Acute blood loss anemia: Due to #1. Currently hemoglobin is 10.5. No further bloody stools. Patient has been typed and screened in anticipation of any transfusion she may need. continue serial CBCs.   Tachycardia: Likely related to #1. Resolved.    AVM (arteriovenous malformation) of colon: History of same. See #1. Appreciate GI consult   Diverticulosis: History of same. See #1.    Anxiety: Appears stable at baseline.   GERD (gastroesophageal reflux disease): clear liquids. PPI   Abdominal pain, other specified site: Related to #1. Denies abdominal pain.    Code Status: full Family Communication: none present Disposition Plan: home when ready.   Consultants:  Dr Jena Gauss GI  Procedures:  none  Antibiotics:  none  HPI/Subjective: Eating clear liquid breakfast. Denies pain/discomfort/nause. Reports little sleep due to noise.   Objective: Filed Vitals:   08/09/12 0400 08/09/12 0500 08/09/12 0600 08/09/12 0735  BP: 112/78 123/86 126/67 122/79  Pulse:      Temp: 98.2 F (36.8 C)     TempSrc: Oral     Resp: 19 19 20 14   Height:      Weight: 182 lb 8.7 oz (82.8 kg)     SpO2: 98%       Intake/Output Summary (Last 24 hours) at 08/09/12 0844 Last data filed at 08/09/12 0700  Gross per 24 hour  Intake 2036.67 ml  Output    500 ml  Net 1536.67 ml   Filed Weights   08/08/12 1101 08/09/12 0400   Weight: 169 lb 12.1 oz (77 kg) 182 lb 8.7 oz (82.8 kg)    Exam:   General:  Obese smiling NAD  Cardiovascular: RRR No MGR No LE edema  Respiratory: normal effort BS clear bilaterally no wheeze no rhonchi  Abdomen: obese +BS in all 4 quadrants mild tenderness to palpation in lower quadrants.   Musculoskeletal: no clubbing no cyanosis   Data Reviewed: Basic Metabolic Panel:  Recent Labs Lab 08/08/12 0818  NA 139  K 3.8  CL 105  CO2 24  GLUCOSE 107*  BUN 13  CREATININE 0.80  CALCIUM 9.1   Liver Function Tests:  Recent Labs Lab 08/08/12 0818  AST 17  ALT 10  ALKPHOS 109  BILITOT 0.2*  PROT 7.0  ALBUMIN 3.7    Recent Labs Lab 08/08/12 0818  LIPASE 58   No results found for this basename: AMMONIA,  in the last 168 hours CBC:  Recent Labs Lab 08/08/12 0818 08/08/12 1105 08/08/12 1746  WBC 7.1 10.5 10.2  NEUTROABS 5.3  --   --   HGB 13.4 12.2 10.5*  HCT 41.1 37.8 33.0*  MCV 90.5 91.3 92.2  PLT 402* 405* 365   Cardiac Enzymes:  Recent Labs Lab 08/08/12 0818  TROPONINI <0.30   BNP (last 3 results) No results found for this basename: PROBNP,  in the last 8760 hours CBG: No results found for this basename: GLUCAP,  in the last 168 hours  Recent Results (  from the past 240 hour(s))  MRSA PCR SCREENING     Status: None   Collection Time    08/08/12 11:00 AM      Result Value Range Status   MRSA by PCR NEGATIVE  NEGATIVE Final   Comment:            The GeneXpert MRSA Assay (FDA     approved for NASAL specimens     only), is one component of a     comprehensive MRSA colonization     surveillance program. It is not     intended to diagnose MRSA     infection nor to guide or     monitor treatment for     MRSA infections.     Studies: No results found.  Scheduled Meds: . pantoprazole  40 mg Oral Daily   Continuous Infusions: . sodium chloride 75 mL/hr at 08/08/12 1900    Principal Problem:   Hematochezia Active Problems:    Anemia   Anxiety   GERD (gastroesophageal reflux disease)   Abdominal  pain, other specified site   Tachycardia   AVM (arteriovenous malformation) of colon   Diverticulosis   GI bleed    Time spent: 30 minutes    [facility]  [facility] Pager [phone]. If 7PM-7AM, please contact night-coverage at [email or web address], password [facility] 08/09/2012, 8:44 AM  LOS: 1 day   Attending note:  Patient seen and examined. Above note reviewed and agree.  She has not had any further bowel movements since admission.  Hemoglobin is slightly lower, but this may also be dilutional effect.  Heart rate is better.  Continue supportive care.  GI input appreciated.  Transfer to floor. Agree with advancing diet later today if stable.  Hopefully home tomorrow if remains stable.

## 2012-08-10 ENCOUNTER — Encounter (HOSPITAL_COMMUNITY)
Admission: EM | Disposition: A | Discharge status: Home / Self Care | Payer: Self-pay | Source: Home / Self Care | Attending: Internal Medicine

## 2012-08-10 DIAGNOSIS — E860 Dehydration: Secondary | ICD-10-CM

## 2012-08-10 DIAGNOSIS — K449 Diaphragmatic hernia without obstruction or gangrene: Secondary | ICD-10-CM

## 2012-08-10 DIAGNOSIS — K921 Melena: Secondary | ICD-10-CM

## 2012-08-10 DIAGNOSIS — R109 Unspecified abdominal pain: Secondary | ICD-10-CM

## 2012-08-10 DIAGNOSIS — K2980 Duodenitis without bleeding: Secondary | ICD-10-CM

## 2012-08-10 HISTORY — PX: ESOPHAGOGASTRODUODENOSCOPY: SHX5428

## 2012-08-10 LAB — CBC
HCT: 26.8 % — ABNORMAL LOW (ref 36.0–46.0)
HCT: 27.6 % — ABNORMAL LOW (ref 36.0–46.0)
Hemoglobin: 8.9 g/dL — ABNORMAL LOW (ref 12.0–15.0)
MCHC: 32.2 g/dL (ref 30.0–36.0)
MCV: 90.5 fL (ref 78.0–100.0)
Platelets: 352 10*3/uL (ref 150–400)
RBC: 2.96 MIL/uL — ABNORMAL LOW (ref 3.87–5.11)
RBC: 3.01 MIL/uL — ABNORMAL LOW (ref 3.87–5.11)
RDW: 14 % (ref 11.5–15.5)
WBC: 5.1 10*3/uL (ref 4.0–10.5)

## 2012-08-10 SURGERY — EGD (ESOPHAGOGASTRODUODENOSCOPY)
Anesthesia: Moderate Sedation | Laterality: Left

## 2012-08-10 MED ORDER — MIDAZOLAM HCL 5 MG/5ML IJ SOLN
INTRAMUSCULAR | Status: DC | PRN
  Administered 2012-08-10 (×2): 1 mg via INTRAVENOUS

## 2012-08-10 MED ORDER — MEPERIDINE HCL 100 MG/ML IJ SOLN
INTRAMUSCULAR | Status: DC | PRN
  Administered 2012-08-10 (×2): 25 mg via INTRAVENOUS

## 2012-08-10 MED ORDER — MIDAZOLAM HCL 5 MG/5ML IJ SOLN
INTRAMUSCULAR | Status: AC
  Filled 2012-08-10: qty 10

## 2012-08-10 MED ORDER — MEPERIDINE HCL 100 MG/ML IJ SOLN
INTRAMUSCULAR | Status: AC
  Filled 2012-08-10: qty 1

## 2012-08-10 MED ORDER — SODIUM CHLORIDE 0.9 % IV SOLN: INTRAVENOUS | Status: DC

## 2012-08-10 MED ORDER — TRAMADOL HCL 50 MG PO TABS
50.0000 mg | ORAL_TABLET | Freq: Four times a day (QID) | ORAL | Status: DC | PRN
  Administered 2012-08-10 – 2012-08-11 (×2): 50 mg via ORAL
  Filled 2012-08-10 (×2): qty 1

## 2012-08-10 MED ORDER — ONDANSETRON HCL 4 MG/2ML IJ SOLN
INTRAMUSCULAR | Status: AC
  Filled 2012-08-10: qty 2

## 2012-08-10 MED ORDER — BUTAMBEN-TETRACAINE-BENZOCAINE 2-2-14 % EX AERO
INHALATION_SPRAY | CUTANEOUS | Status: DC | PRN
  Administered 2012-08-10: 1 via TOPICAL

## 2012-08-10 MED ORDER — STERILE WATER FOR IRRIGATION IR SOLN
Status: DC | PRN
  Administered 2012-08-10: 16:00:00

## 2012-08-10 MED ORDER — ONDANSETRON HCL 4 MG/2ML IJ SOLN
INTRAMUSCULAR | Status: DC | PRN
  Administered 2012-08-10: 4 mg via INTRAVENOUS

## 2012-08-10 NOTE — Progress Notes (Signed)
[facility] PROGRESS NOTE  [name] ZOX:[id number] DOB: 1928-09-17 DOA: 08/08/2012 PCP: Amy Heap, MD  Assessment/Plan: Hematochezia: No further episodes. Hg trending down. Currently 8.9. Asymptomatic.  Currently on Clear liquids.  Will advance to solid food today. Evaluated by GI who opined likely ischemic colitis. Recommending continuation of supportive care and no plans for colonoscopy at this point.  Patient is hemodynamically stable at this time and clinically appears to be improving. Check hemoglobin in afternoon again.  Active Problems:  GI bleed: See #1. Will hold Mobic and aspirin.    Acute blood loss anemia: Due to #1. Currently hemoglobin is 8.9. No further bloody stools. Patient has been typed and screened in anticipation of any transfusion she may need. continue serial CBCs. There is likely a dilutional component here as well.  Maintenance fluids have been discontinued.  Tachycardia: Likely related to #1. Resolved.    AVM (arteriovenous malformation) of colon: History of same. See #1. Appreciate GI consult   Diverticulosis: History of same. See #1.    Anxiety: Appears stable at baseline.   GERD (gastroesophageal reflux disease): clear liquids. PPI   Abdominal pain, other specified site: Related to #1. Denies abdominal pain.    Code Status: full Family Communication: none present Disposition Plan: home when ready.   Consultants:  Dr Jena Gauss GI  Procedures:  none  Antibiotics:  none  HPI/Subjective: Feels abdominal pain is doing better.  Has pain in joints from arthritis. Had a bowel movement yesterday which she reports did not have blood in it.  Has not had a bowel movement today  Objective: Filed Vitals:   08/09/12 0735 08/09/12 1355 08/09/12 2144 08/10/12 0601  BP: 122/79 143/89 125/73 147/77  Pulse:  88 79 84  Temp:  98.1 F (36.7 C) 98 F (36.7 C) 98.4 F (36.9 C)  TempSrc:  Oral Oral Oral  Resp: 14 16 20 20   Height:       Weight:      SpO2:  97% 98% 97%    Intake/Output Summary (Last 24 hours) at 08/10/12 1029 Last data filed at 08/10/12 0800  Gross per 24 hour  Intake    600 ml  Output   1050 ml  Net   -450 ml   Filed Weights   08/08/12 1101 08/09/12 0400  Weight: 77 kg (169 lb 12.1 oz) 82.8 kg (182 lb 8.7 oz)    Exam:   General:  Obese smiling NAD  Cardiovascular: RRR No MGR No LE edema  Respiratory: normal effort BS clear bilaterally no wheeze no rhonchi  Abdomen: obese +BS in all 4 quadrants no tenderness to palpation in lower quadrants.   Musculoskeletal: no clubbing no cyanosis   Data Reviewed: Basic Metabolic Panel:  Recent Labs Lab 08/08/12 0818  NA 139  K 3.8  CL 105  CO2 24  GLUCOSE 107*  BUN 13  CREATININE 0.80  CALCIUM 9.1   Liver Function Tests:  Recent Labs Lab 08/08/12 0818  AST 17  ALT 10  ALKPHOS 109  BILITOT 0.2*  PROT 7.0  ALBUMIN 3.7    Recent Labs Lab 08/08/12 0818  LIPASE 58   No results found for this basename: AMMONIA,  in the last 168 hours CBC:  Recent Labs Lab 08/08/12 0818 08/08/12 1105 08/08/12 1746 08/09/12 1614 08/10/12 0527  WBC 7.1 10.5 10.2 7.3 5.1  NEUTROABS 5.3  --   --   --   --   HGB 13.4 12.2 10.5* 9.4* 8.9*  HCT 41.1  37.8 33.0* 28.9* 26.8*  MCV 90.5 91.3 92.2 91.2 90.5  PLT 402* 405* 365 325 352   Cardiac Enzymes:  Recent Labs Lab 08/08/12 0818  TROPONINI <0.30   BNP (last 3 results) No results found for this basename: PROBNP,  in the last 8760 hours CBG: No results found for this basename: GLUCAP,  in the last 168 hours  Recent Results (from the past 240 hour(s))  MRSA PCR SCREENING     Status: None   Collection Time    08/08/12 11:00 AM      Result Value Range Status   MRSA by PCR NEGATIVE  NEGATIVE Final   Comment:            The GeneXpert MRSA Assay (FDA     approved for NASAL specimens     only), is one component of a     comprehensive MRSA colonization     surveillance program. It is  not     intended to diagnose MRSA     infection nor to guide or     monitor treatment for     MRSA infections.     Studies: No results found.  Scheduled Meds: . DULoxetine  30 mg Oral Daily  . meclizine  25 mg Oral BID  . pantoprazole  40 mg Oral Daily   Continuous Infusions:    Principal Problem:   Hematochezia Active Problems:   Anemia   Anxiety   GERD (gastroesophageal reflux disease)   Abdominal  pain, other specified site   Tachycardia   AVM (arteriovenous malformation) of colon   Diverticulosis   GI bleed   Acute blood loss anemia    Time spent: 30 minutes    MEMON,JEHANZEB  [facility] Pager [phone]. If 7PM-7AM, please contact night-coverage at [email or web address], password [facility] 08/10/2012, 10:29 AM  LOS: 2 days

## 2012-08-10 NOTE — Progress Notes (Signed)
Subjective: Tolerating clear liquids. No further hematochezia since being in the ED on admission. Multiple family members present. The patient and family relate some degree of chronic epigastric and right mid abdominal pain. Denies any problems with constipation recently. No melena or hematemesis. Chronic GERD on protonix. Colonoscopy less than 3 years ago  documented AVMs and diverticulosis. Aspirin and Mobic at home.  Objective: Vital signs in last 24 hours: Temp:  [98 F (36.7 C)-98.4 F (36.9 C)] 98.4 F (36.9 C) (08/02 0601) Pulse Rate:  [79-88] 84 (08/02 0601) Resp:  [16-20] 20 (08/02 0601) BP: (125-147)/(73-89) 147/77 mmHg (08/02 0601) SpO2:  [97 %-98 %] 97 % (08/02 0601) Last BM Date: 08/09/12 General:   Alert,   pleasant and cooperative in NAD. Multiple family members present. Abdomen:  Nondistended.  Normal bowel sounds, minimal epigastric and right mid abdominal tenderness to palpation. without guarding, and without rebound.  No mass or organomegaly. Extremities:  Without clubbing or edema.    Intake/Output from previous day: 08/01 0701 - 08/02 0700 In: 1065 [P.O.:840; I.V.:225] Out: 1550 [Urine:1550] Intake/Output this shift: Total I/O In: 120 [P.O.:120] Out: -   Lab Results:  Recent Labs  08/08/12 1746 08/09/12 1614 08/10/12 0527  WBC 10.2 7.3 5.1  HGB 10.5* 9.4* 8.9*  HCT 33.0* 28.9* 26.8*  PLT 365 325 352   BMET  Recent Labs  08/08/12 0818  NA 139  K 3.8  CL 105  CO2 24  GLUCOSE 107*  BUN 13  CREATININE 0.80  CALCIUM 9.1   LFT  Recent Labs  08/08/12 0818  PROT 7.0  ALBUMIN 3.7  AST 17  ALT 10  ALKPHOS 109  BILITOT 0.2*   PT/INR  Recent Labs  08/08/12 0818  LABPROT 13.4  INR 1.04      Impression:   She has remained stable thus far during her hospitalization. No further bleeding, clinically. She has displayed a 4+ unit drop in hemoglobin since admission. This would be unusual for ischemic colitis. Colonic AVM or colonic  diverticular bleed certainly possible. She has vague upper abdominal pain in the setting of concomitant NSAID/aspirin use. Even though she's been on a PPI, the possibility of occult NSAID gastropathy/peptic ulcer disease with recent rapid transit bleed needs to be considered.  I discussed with Dr Kerry Hough, patient and multiple family members. I feel it would be in the patient's best interest to go ahead and have an EGD just to make sure she does not have significant, occult peptic ulcer disease.  The did have broth for lunch and, therefore, we'll plan for later this afternoon.  The risks, benefits, limitations, alternatives and imponderables have been reviewed with the patient. Potential for esophageal dilation, biopsy, etc. have also been reviewed.  Questions have been answered. All parties agreeable.

## 2012-08-10 NOTE — Op Note (Signed)
[facility] [address] [place], [address]   ENDOSCOPY PROCEDURE REPORT  PATIENT: [name], [name]  MR#: [id number] BIRTHDATE: [birth year] , 84  yrs. old GENDER: Female ENDOSCOPIST: R.  Roetta Sessions, MD FACP [facility] REFERRED BY:  Wardell Heath, N.P. PROCEDURE DATE:  08/10/2012 PROCEDURE:     Diagnostic EGD  INDICATIONS:     hematochezia; 4.5 g drop in hemoglobin. Upper abdominal pain on Mobic and aspirin  INFORMED CONSENT:   The risks, benefits, limitations, alternatives and imponderables have been discussed.  The potential for biopsy, esophogeal dilation, etc. have also been reviewed.  Questions have been answered.  All parties agreeable.  Please see the history and physical in the medical record for more information.  MEDICATIONS:  Versed 2 mg IV Demeroll 25 mg IV in divided doses. Cetacaine spray. Zofran 4 mg IV  DESCRIPTION OF PROCEDURE:   The AO-1308M ([id number])  endoscope was introduced through the mouth and advanced to the second portion of the duodenum without difficulty or limitations.  The mucosal surfaces were surveyed very carefully during advancement of the scope and upon withdrawal.  Retroflexion view of the proximal stomach and esophagogastric junction was performed.      FINDINGS: Normal esophagus. Stomach empty. Small hernia. No ulcer or infiltrating process involving the gastric mucosa. Pylorus patent. Examination of bulb and second portion revealed marked friability and mild narrowing of the junction between the first and second portion of the duodenum. There were erosions present in this segment; I did not see an ulcer. To gain better visualization, I withdrew the scope and placed a sclerotherapy cap on the end of the gastroscope; the scope was reintroduced into the duodenum. This expanded my field of vision significantly .  Although the mucosa was markedly friable and eroded at the junction of the first and second portion, I did not  see an ulcer.  THERAPEUTIC / DIAGNOSTIC MANEUVERS PERFORMED:  None   COMPLICATIONS:  None  IMPRESSION:   Hiatal hernia;   Erosive duodenitis - Probably secondary to NSAID gastropathy. Need to rule out H. pylori.  I suspect recent hematochezia originating from her more distal GI tract. No further bleeding observed since time of admission.Marland Kitchen  RECOMMENDATIONS:  Continue Protonix 40 mg daily. Will advance diet. Check H. pylori serologies.  Avoid nonsteroidal agents. Will arrange close interval followup with Dr. Darrick Penna to reassess and determine whether or not further evaluation of her GI tract is warranted. I have discussed the findings recommendations with the patient's daughter this afternoon.    _______________________________ R. Roetta Sessions, MD FACP [facility] eSigned:  R. Roetta Sessions, MD FACP [facility] 08/10/2012 4:31 PM     CC:  PATIENT NAME:  [name], [name] MR#: [id number]

## 2012-08-11 DIAGNOSIS — J438 Other emphysema: Secondary | ICD-10-CM

## 2012-08-11 LAB — CBC
MCH: 29.7 pg (ref 26.0–34.0)
MCHC: 32.3 g/dL (ref 30.0–36.0)
MCV: 91.8 fL (ref 78.0–100.0)
Platelets: 353 10*3/uL (ref 150–400)
RBC: 2.93 MIL/uL — ABNORMAL LOW (ref 3.87–5.11)

## 2012-08-11 MED ORDER — POLYSACCHARIDE IRON COMPLEX 150 MG PO CAPS: 150.0000 mg | ORAL_CAPSULE | Freq: Every day | ORAL | Status: DC

## 2012-08-11 MED ORDER — TRAMADOL HCL 50 MG PO TABS: 50.0000 mg | ORAL_TABLET | Freq: Four times a day (QID) | ORAL | Status: DC | PRN

## 2012-08-11 NOTE — Discharge Summary (Signed)
Physician Discharge Summary  KADELYN DIMASCIO YQM:[id number] DOB: [birth year] DOA: 08/08/2012  PCP: Rudi Heap, MD  Admit date: 08/08/2012 Discharge date: 08/11/2012  Time spent: 35 minutes  Recommendations for Outpatient Follow-up:  1. Patient will have close follow up with Dr. Darrick Penna, Dr. Jena Gauss will arrange this 2. Follow up with primary physician in 2 weeks  Discharge Diagnoses:  Principal Problem:   Hematochezia Active Problems:   Anemia   Anxiety   GERD (gastroesophageal reflux disease)   Abdominal  pain, other specified site   Tachycardia   AVM (arteriovenous malformation) of colon   Diverticulosis   GI bleed   Acute blood loss anemia   Discharge Condition: improved  Diet recommendation: low salt  Filed Weights   08/08/12 1101 08/09/12 0400  Weight: 77 kg (169 lb 12.1 oz) 82.8 kg (182 lb 8.7 oz)    History of present illness:  [name] is a 79 y.o. female with a past medical history significant for COPD, anxiety, and degenerative joint disease, GERD, colonic AVM and diverticulosis presents to the emergency room with the chief complaint of bloody stool. Patient states that she awakened this morning and had brief abdominal cramping in her lower abdomen and she also felt bloated. She had a bowel movement consisted of loose brown stool and bright red blood. She denies any clots. Associated symptoms include generalized weakness over the last 2 days, worsening shortness of breath. She denies any nausea vomiting constipation. She denies any chest pain palpitations headache dizziness. She indicates that she struggles with vertigo and that is at baseline. She denies any syncope or near-syncope. She denies taking any blood thinners but she has been taking mobic for several years. Vital signs in the emergency department or significant for heart rate of 125, lab work unremarkable. While in the emergency room patient had 3 episodes one of which witnessed by me of moderate to  large amounts of bright red blood with loose brown stool. Symptoms came on suddenly have persisted and worsened are characterized as severe. [facility] are asked to admit   Hospital Course:  This patient was admitted to the hospital with rectal bleeding. She has lower abdominal cramping. She does have a history of diverticulosis as well as colonic AVMs in the past. Patient was also taking Mobic and aspirin prior to admission. She was seen by gastroenterology and started on proton pump inhibitors. Fortunately, the bleeding resolved on its own. Colonoscopy was not needed. She did undergo an endoscopy to rule out any peptic ulcer. Results indicated erosive duodenitis without any distinct ulcers. It was recommended that patient stay off of any NSAID as well as aspirin until she follows up with GI as an outpatient. It was also felt that endoscopy results would not explain her significant blood loss of approximately 4 g (although part of this is dilutional). Origin of bleeding was likely further down the GI tract. Differential diagnoses include diverticular bleed versus ischemic colitis. She's not had any further bleeding in the hospital. Her hemoglobin stabilized at 8.9. She's not required any blood transfusions. Patient is feeling significantly improved. She is now tolerating a solid diet without any difficulties. Case was discussed with Dr. Jena Gauss who also agrees with discharge today. Patient will followup with GI in the outpatient setting.  Procedures: Endoscopy: Hiatal hernia; Erosive duodenitis - Probably  secondary to NSAID gastropathy. Need to rule out H. pylori. I  suspect recent hematochezia originating from her more distal GI  tract. No further bleeding observed since  time of admission..    Consultations:  Gastroenterology, Dr. Jena Gauss  Discharge Exam: Filed Vitals:   08/10/12 1634 08/10/12 1814 08/10/12 2257 08/11/12 0622  BP: 139/54 109/47 131/79 123/76  Pulse: 85 96 76 77  Temp:   98.2 F (36.8 C) 98.2 F (36.8 C) 98.4 F (36.9 C)  TempSrc:   Oral Oral  Resp: 18 18 18 18   Height:      Weight:      SpO2: 98% 93% 99% 99%    General: NAD Cardiovascular: S1, S2 RRR Respiratory: CTA B  Discharge Instructions  Discharge Orders   Future Orders Complete By Expires     Call MD for:  extreme fatigue  As directed     Call MD for:  persistant dizziness or light-headedness  As directed     Call MD for:  As directed     Comments:      Recurrent bleeding    Diet - low sodium heart healthy  As directed     Increase activity slowly  As directed         Medication List    STOP taking these medications       aspirin EC 81 MG tablet     doxycycline 100 MG tablet  Commonly known as:  VIBRA-TABS     meloxicam 15 MG tablet  Commonly known as:  MOBIC      TAKE these medications       albuterol 108 (90 BASE) MCG/ACT inhaler  Commonly known as:  PROVENTIL HFA;VENTOLIN HFA  Inhale 2 puffs into the lungs every 4 (four) hours as needed for wheezing.     DULoxetine 30 MG capsule  Commonly known as:  CYMBALTA  Take 1 capsule (30 mg total) by mouth daily.     iron polysaccharides 150 MG capsule  Commonly known as:  FERREX 150  Take 1 capsule (150 mg total) by mouth daily. Takes on Tuesdays and Thursdays     meclizine 25 MG tablet  Commonly known as:  ANTIVERT  TAKE 1 TABLET BY MOUTH TWICE A DAY     pantoprazole 40 MG tablet  Commonly known as:  PROTONIX  TAKE 1 TABLET BY MOUTH ONCE DAILY     traMADol 50 MG tablet  Commonly known as:  ULTRAM  Take 1 tablet (50 mg total) by mouth every 6 (six) hours as needed.       Allergies  Allergen Reactions  . Avelox (Moxifloxacin Hcl In Nacl)   . Codeine Nausea And Vomiting  . Cortisone Rash       Follow-up Information   Follow up with Jonette Eva, MD. (they will call you with an appointment)    Contact information:   [address] [place] [address] [phone]        Follow up with Rudi Heap, MD. Schedule an appointment as soon as possible for a visit in 2 weeks.   Contact information:   [address] [place] [address] [phone]        The results of significant diagnostics from this hospitalization (including imaging, microbiology, ancillary and laboratory) are listed below for reference.    Significant Diagnostic Studies: No results found.  Microbiology: Recent Results (from the past 240 hour(s))  MRSA PCR SCREENING     Status: None   Collection Time    08/08/12 11:00 AM      Result Value Range Status   MRSA by PCR NEGATIVE  NEGATIVE Final   Comment:            The GeneXpert MRSA Assay (FDA     approved for NASAL specimens     only), is one component of a     comprehensive MRSA colonization     surveillance program. It is not     intended to diagnose MRSA     infection nor to guide or     monitor treatment for     MRSA infections.     Labs: Basic Metabolic Panel:  Recent Labs Lab 08/08/12 0818  NA 139  K 3.8  CL 105  CO2 24  GLUCOSE 107*  BUN 13  CREATININE 0.80  CALCIUM 9.1   Liver Function Tests:  Recent Labs Lab 08/08/12 0818  AST 17  ALT 10  ALKPHOS 109  BILITOT 0.2*  PROT 7.0  ALBUMIN 3.7    Recent Labs Lab 08/08/12 0818  LIPASE 58   No results found for this basename: AMMONIA,  in the last 168 hours CBC:  Recent Labs Lab 08/08/12 0818  08/08/12 1746 08/09/12 1614 08/10/12 0527 08/10/12 1407 08/11/12 0523  WBC 7.1  < > 10.2 7.3 5.1 7.1 6.2  NEUTROABS 5.3  --   --   --   --   --   --   HGB 13.4  < > 10.5* 9.4* 8.9* 8.9* 8.7*  HCT 41.1  < > 33.0* 28.9* 26.8* 27.6* 26.9*  MCV 90.5  < > 92.2 91.2 90.5 91.7 91.8  PLT 402*  < > 365 325 352 337 353  < > = values in this interval not displayed. Cardiac Enzymes:  Recent Labs Lab 08/08/12 0818  TROPONINI <0.30   BNP: BNP (last 3 results) No results found for this basename: PROBNP,  in the last 8760 hours CBG: No results  found for this basename: GLUCAP,  in the last 168 hours     Signed:  Kallum Jorgensen  [facility] 08/11/2012, 2:31 PM

## 2012-08-11 NOTE — Progress Notes (Signed)
IV removed, site WNL.  Pt given d/c instructions and new prescriptions.  Discussed home care with patient and discussed home medications (new meds and medications to stop), patient verbalizes understanding, teachback completed. F/U appointment to be made by patient when office opens, pt states she will make and keep appointment. Pt is stable and agreeable to discharge at this time. Pt's daughter notified of discharge and is on the way to the hospital at this time.

## 2012-08-12 ENCOUNTER — Encounter (HOSPITAL_COMMUNITY): Payer: Self-pay | Admitting: Internal Medicine

## 2012-08-22 ENCOUNTER — Encounter: Payer: Self-pay | Admitting: General Practice

## 2012-08-22 ENCOUNTER — Ambulatory Visit (INDEPENDENT_AMBULATORY_CARE_PROVIDER_SITE_OTHER): Payer: Medicare Other

## 2012-08-22 ENCOUNTER — Ambulatory Visit (INDEPENDENT_AMBULATORY_CARE_PROVIDER_SITE_OTHER): Payer: Medicare Other | Admitting: General Practice

## 2012-08-22 VITALS — BP 129/75 | HR 108 | Temp 97.4°F | Wt 167.5 lb

## 2012-08-22 DIAGNOSIS — M25559 Pain in unspecified hip: Secondary | ICD-10-CM

## 2012-08-22 DIAGNOSIS — Z09 Encounter for follow-up examination after completed treatment for conditions other than malignant neoplasm: Secondary | ICD-10-CM

## 2012-08-22 DIAGNOSIS — M1711 Unilateral primary osteoarthritis, right knee: Secondary | ICD-10-CM

## 2012-08-22 DIAGNOSIS — M25551 Pain in right hip: Secondary | ICD-10-CM

## 2012-08-22 DIAGNOSIS — M1990 Unspecified osteoarthritis, unspecified site: Secondary | ICD-10-CM

## 2012-08-22 DIAGNOSIS — M1710 Unilateral primary osteoarthritis, unspecified knee: Secondary | ICD-10-CM

## 2012-08-22 DIAGNOSIS — IMO0002 Reserved for concepts with insufficient information to code with codable children: Secondary | ICD-10-CM

## 2012-08-22 DIAGNOSIS — M129 Arthropathy, unspecified: Secondary | ICD-10-CM

## 2012-08-22 DIAGNOSIS — D649 Anemia, unspecified: Secondary | ICD-10-CM

## 2012-08-22 DIAGNOSIS — T148XXA Other injury of unspecified body region, initial encounter: Secondary | ICD-10-CM

## 2012-08-22 LAB — POCT CBC
Granulocyte percent: 70.4 %G (ref 37–80)
Lymph, poc: 1.1 (ref 0.6–3.4)
MCV: 86.2 fL (ref 80–97)
POC LYMPH PERCENT: 21.6 %L (ref 10–50)
Platelet Count, POC: 601 10*3/uL — AB (ref 142–424)
RDW, POC: 14.9 %
WBC: 5.1 10*3/uL (ref 4.6–10.2)

## 2012-08-22 MED ORDER — METHYLPREDNISOLONE ACETATE 80 MG/ML IJ SUSP
80.0000 mg | Freq: Once | INTRAMUSCULAR | Status: AC
  Administered 2012-08-22: 80 mg via INTRAMUSCULAR

## 2012-08-22 NOTE — Patient Instructions (Addendum)
Muscle Strain  Muscle strain occurs when a muscle is stretched beyond its normal length. A small number of muscle fibers generally are torn. This is especially common in athletes. This happens when a sudden, violent force placed on a muscle stretches it too far. Usually, recovery from muscle strain takes 1 to 2 weeks. Complete healing will take 5 to 6 weeks.   HOME CARE INSTRUCTIONS    While awake, apply ice to the sore muscle for the first 2 days after the injury.   Put ice in a plastic bag.   Place a towel between your skin and the bag.   Leave the ice on for 15-20 minutes each hour.   Do not use the strained muscle for several days, until you no longer have pain.   You may wrap the injured area with an elastic bandage for comfort. Be careful not to wrap it too tightly. This may interfere with blood circulation or increase swelling.   Only take over-the-counter or prescription medicines for pain, discomfort, or fever as directed by your caregiver.  SEEK MEDICAL CARE IF:   You have increasing pain or swelling in the injured area.  MAKE SURE YOU:    Understand these instructions.   Will watch your condition.   Will get help right away if you are not doing well or get worse.  Document Released: 12/26/2004 Document Revised: 03/20/2011 Document Reviewed: 01/07/2011  Exit[employer] Patient Information 2014 [employer].

## 2012-08-22 NOTE — Progress Notes (Signed)
  Subjective:    Patient ID: [name], female    DOB: [birth year], 79 y.o.   MRN: [id number]  HPI Presents today for hospital follow up. She was admitted for blood stools. Denies any since being discharged (2 weeks ago). She complains today of right hip pain which is new. Reports it began two weeks ago, while in the hospital.  Reports taking Ferrex 150mg  on twice weekly.   Review of Systems  Constitutional: Negative for fever and chills.  Respiratory: Negative for chest tightness and shortness of breath.   Gastrointestinal: Negative for vomiting, abdominal pain, diarrhea and blood in stool.  Genitourinary: Negative for dysuria and difficulty urinating.  Musculoskeletal:       Chronic joint pain, arthritis  Neurological: Negative for dizziness, weakness and headaches.       Objective:   Physical Exam  Constitutional: She is oriented to person, place, and time. She appears well-developed and well-nourished.  Cardiovascular: Normal rate, regular rhythm and normal heart sounds.   Pulmonary/Chest: Effort normal and breath sounds normal.  Neurological: She is alert and oriented to person, place, and time.  Skin: Skin is warm and dry.  Psychiatric: She has a normal mood and affect.   [facility] reading (PRIMARY) by Coralie Keens, FNP-C, no fracture or dislocation.                               Results for orders placed in visit on 08/22/12  POCT CBC      Result Value Range   WBC 5.1  4.6 - 10.2 K/uL   Lymph, poc 1.1  0.6 - 3.4   POC LYMPH PERCENT 21.6  10 - 50 %L   POC Granulocyte 3.6  2 - 6.9   Granulocyte percent 70.4  37 - 80 %G   RBC 3.7 (*) 4.04 - 5.48 M/uL   Hemoglobin 10.2 (*) 12.2 - 16.2 g/dL   HCT, POC 78.2 (*) 95.6 - 47.9 %   MCV 86.2  80 - 97 fL   MCH, POC 27.3  27 - 31.2 pg   MCHC 31.6 (*) 31.8 - 35.4 g/dL   RDW, POC 21.3     Platelet Count, POC 601.0 (*) 142 - 424 K/uL   MPV 6.9  0 - 99.8 fL           Assessment & Plan:  1. Right hip pain - DG Hip  Complete Right; Future - methylPREDNISolone acetate (DEPO-MEDROL) injection 80 mg; Inject 1 mL (80 mg total) into the muscle once.  2. Hospital discharge follow-up  3. Hemoglobin low - POCT CBC  4. Muscle strain - methylPREDNISolone acetate (DEPO-MEDROL) injection 80 mg; Inject 1 mL (80 mg total) into the muscle once. -information sheet on muscle strain provided and discussed   5. Osteoarthritis of right knee - methylPREDNISolone acetate (DEPO-MEDROL) injection 80 mg; Inject 1 mL (80 mg total) into the muscle once.  6. Chronic arthritis - Ambulatory referral  RTO if symptoms worsen and in 1 month for hgb recheck -Patient and daughter verbalized understanding -Coralie Keens, FNP-C

## 2012-09-10 ENCOUNTER — Encounter: Payer: Self-pay | Admitting: *Deleted

## 2012-10-01 ENCOUNTER — Ambulatory Visit (INDEPENDENT_AMBULATORY_CARE_PROVIDER_SITE_OTHER): Payer: Medicare Other | Admitting: General Practice

## 2012-10-07 ENCOUNTER — Ambulatory Visit (INDEPENDENT_AMBULATORY_CARE_PROVIDER_SITE_OTHER): Payer: Medicare Other | Admitting: General Practice

## 2012-10-07 ENCOUNTER — Encounter: Payer: Self-pay | Admitting: General Practice

## 2012-10-07 VITALS — BP 142/65 | HR 96 | Temp 98.2°F | Ht 66.0 in | Wt 171.0 lb

## 2012-10-07 DIAGNOSIS — M1990 Unspecified osteoarthritis, unspecified site: Secondary | ICD-10-CM

## 2012-10-07 DIAGNOSIS — D649 Anemia, unspecified: Secondary | ICD-10-CM

## 2012-10-07 DIAGNOSIS — M2550 Pain in unspecified joint: Secondary | ICD-10-CM

## 2012-10-07 DIAGNOSIS — M129 Arthropathy, unspecified: Secondary | ICD-10-CM

## 2012-10-07 DIAGNOSIS — Z09 Encounter for follow-up examination after completed treatment for conditions other than malignant neoplasm: Secondary | ICD-10-CM

## 2012-10-07 LAB — POCT CBC
Hemoglobin: 11 g/dL — AB (ref 12.2–16.2)
Lymph, poc: 1.5 (ref 0.6–3.4)
MCH, POC: 23.7 pg — AB (ref 27–31.2)
MCV: 77.6 fL — AB (ref 80–97)
Platelet Count, POC: 515 10*3/uL — AB (ref 142–424)
RBC: 4.7 M/uL (ref 4.04–5.48)

## 2012-10-07 NOTE — Patient Instructions (Signed)

## 2012-10-07 NOTE — Progress Notes (Signed)
Subjective:    Patient ID: [name], female    DOB: March 18, 1928, 79 y.o.   MRN: [id number]  HPI Patient presents today for 3 month follow up. She has a history of arthritis, gerd, vertigo, and copd. She was seen recently by an ortho specialist for right hip pain. Complains of increasing arthritic pain.     Review of Systems  Constitutional: Negative for fever and chills.  HENT: Positive for neck pain. Negative for neck stiffness.   Respiratory: Negative for chest tightness and shortness of breath.   Gastrointestinal: Negative for abdominal pain, diarrhea, constipation and blood in stool.  Genitourinary: Negative for hematuria, flank pain and difficulty urinating.  Musculoskeletal: Positive for back pain.       Right hip pain  Neurological: Negative for dizziness, weakness and headaches.       Objective:   Physical Exam  Constitutional: She is oriented to person, place, and time. She appears well-developed and well-nourished.  Cardiovascular: Normal rate, regular rhythm and normal heart sounds.   Pulmonary/Chest: Effort normal and breath sounds normal. No respiratory distress. She exhibits no tenderness.  Musculoskeletal:  Grimacing with extremity range of motion and flexion at hips  Neurological: She is alert and oriented to person, place, and time.  Skin: Skin is warm and dry.  Psychiatric: She has a normal mood and affect.    Results for orders placed in visit on 10/07/12  CMP14+EGFR      Result Value Range   Glucose 77  65 - 99 mg/dL   BUN 10  8 - 27 mg/dL   Creatinine, Ser 4.09  0.57 - 1.00 mg/dL   GFR calc non Af Amer 53 (*) >59 mL/min/1.73   GFR calc Af Amer 61  >59 mL/min/1.73   BUN/Creatinine Ratio 10 (*) 11 - 26   Sodium 142  134 - 144 mmol/L   Potassium 4.7  3.5 - 5.2 mmol/L   Chloride 98  97 - 108 mmol/L   CO2 24  18 - 29 mmol/L   Calcium 9.9  8.6 - 10.2 mg/dL   Total Protein 7.0  6.0 - 8.5 g/dL   Albumin 4.6  3.5 - 4.7 g/dL   Globulin, Total 2.4   1.5 - 4.5 g/dL   Albumin/Globulin Ratio 1.9  1.1 - 2.5   Total Bilirubin 0.3  0.0 - 1.2 mg/dL   Alkaline Phosphatase 119 (*) 39 - 117 IU/L   AST 15  0 - 40 IU/L   ALT 9  0 - 32 IU/L  POCT CBC      Result Value Range   WBC 6.9  4.6 - 10.2 K/uL   Lymph, poc 1.5  0.6 - 3.4   POC LYMPH PERCENT 21.9  10 - 50 %L   POC Granulocyte 5.0  2 - 6.9   Granulocyte percent 73.0  37 - 80 %G   RBC 4.7  4.04 - 5.48 M/uL   Hemoglobin 11.0 (*) 12.2 - 16.2 g/dL   HCT, POC 81.1 (*) 91.4 - 47.9 %   MCV 77.6 (*) 80 - 97 fL   MCH, POC 23.7 (*) 27 - 31.2 pg   MCHC 30.6 (*) 31.8 - 35.4 g/dL   RDW, POC 78.2     Platelet Count, POC 515.0 (*) 142 - 424 K/uL   MPV 7.6  0 - 99.8 fL         Assessment & Plan:  1. Follow-up exam, 3-6 months since previous exam  - CMP14+EGFR  2.  Hemoglobin low  - POCT CBC  3. Arthritis  - Ambulatory referral to Rheumatology  4. Arthritic-like pain  - meloxicam (MOBIC) 7.5 MG tablet; Take 1 tablet (7.5 mg total) by mouth daily.  Dispense: 30 tablet; Refill: 3 Continue all current medications Labs pending F/u in 3 months Discussed exercise and diet  RTO if symptoms worsen Patient verbalized understanding Coralie Keens, FNP-C

## 2012-10-08 LAB — CMP14+EGFR
AST: 15 IU/L (ref 0–40)
Albumin/Globulin Ratio: 1.9 (ref 1.1–2.5)
Alkaline Phosphatase: 119 IU/L — ABNORMAL HIGH (ref 39–117)
BUN/Creatinine Ratio: 10 — ABNORMAL LOW (ref 11–26)
Creatinine, Ser: 0.99 mg/dL (ref 0.57–1.00)
GFR calc non Af Amer: 53 mL/min/{1.73_m2} — ABNORMAL LOW (ref >59)
Globulin, Total: 2.4 g/dL (ref 1.5–4.5)
Sodium: 142 mmol/L (ref 134–144)

## 2012-10-08 MED ORDER — MELOXICAM 7.5 MG PO TABS: 7.5000 mg | ORAL_TABLET | Freq: Every day | ORAL | Status: DC

## 2012-10-11 ENCOUNTER — Ambulatory Visit: Payer: Medicare Other | Admitting: Gastroenterology

## 2012-10-21 ENCOUNTER — Other Ambulatory Visit: Payer: Self-pay | Admitting: Nurse Practitioner

## 2012-10-21 MED ORDER — DULOXETINE HCL 30 MG PO CPEP: 30.0000 mg | ORAL_CAPSULE | Freq: Every day | ORAL | Status: DC

## 2012-12-09 ENCOUNTER — Other Ambulatory Visit: Payer: Self-pay

## 2012-12-09 MED ORDER — MECLIZINE HCL 25 MG PO TABS: 25.0000 mg | ORAL_TABLET | Freq: Two times a day (BID) | ORAL | Status: DC

## 2012-12-09 MED ORDER — PANTOPRAZOLE SODIUM 40 MG PO TBEC: 40.0000 mg | DELAYED_RELEASE_TABLET | Freq: Every day | ORAL | Status: DC

## 2012-12-10 ENCOUNTER — Ambulatory Visit (INDEPENDENT_AMBULATORY_CARE_PROVIDER_SITE_OTHER): Payer: Medicare Other | Admitting: Nurse Practitioner

## 2012-12-10 ENCOUNTER — Encounter: Payer: Self-pay | Admitting: Nurse Practitioner

## 2012-12-10 ENCOUNTER — Encounter (INDEPENDENT_AMBULATORY_CARE_PROVIDER_SITE_OTHER): Payer: Self-pay

## 2012-12-10 VITALS — BP 144/88 | HR 104 | Temp 97.6°F | Wt 197.0 lb

## 2012-12-10 DIAGNOSIS — J441 Chronic obstructive pulmonary disease with (acute) exacerbation: Secondary | ICD-10-CM

## 2012-12-10 MED ORDER — LEVALBUTEROL HCL 1.25 MG/3ML IN NEBU
1.2500 mg | INHALATION_SOLUTION | RESPIRATORY_TRACT | Status: AC
  Administered 2012-12-10: 1.25 mg via RESPIRATORY_TRACT

## 2012-12-10 MED ORDER — PREDNISONE (PAK) 10 MG PO TABS: ORAL_TABLET | Freq: Every day | ORAL | Status: DC

## 2012-12-10 MED ORDER — LEVALBUTEROL HCL 1.25 MG/0.5ML IN NEBU: 1.2500 mg | INHALATION_SOLUTION | Freq: Once | RESPIRATORY_TRACT | Status: DC

## 2012-12-10 MED ORDER — ALBUTEROL SULFATE HFA 108 (90 BASE) MCG/ACT IN AERS: 2.0000 | INHALATION_SPRAY | RESPIRATORY_TRACT | Status: DC | PRN

## 2012-12-10 MED ORDER — METHYLPREDNISOLONE ACETATE 80 MG/ML IJ SUSP
80.0000 mg | Freq: Once | INTRAMUSCULAR | Status: AC
  Administered 2012-12-10: 80 mg via INTRAMUSCULAR

## 2012-12-10 MED ORDER — LEVALBUTEROL HCL 1.25 MG/0.5ML IN NEBU: 1.2500 mg | INHALATION_SOLUTION | RESPIRATORY_TRACT | Status: DC

## 2012-12-10 MED ORDER — BUDESONIDE-FORMOTEROL FUMARATE 80-4.5 MCG/ACT IN AERO: 2.0000 | INHALATION_SPRAY | Freq: Two times a day (BID) | RESPIRATORY_TRACT | Status: DC

## 2012-12-10 NOTE — Patient Instructions (Signed)

## 2012-12-10 NOTE — Progress Notes (Signed)
   Subjective:    Patient ID: [name], female    DOB: Jun 09, 1928, 79 y.o.   MRN: [id number]  HPI patient brought in by daughter with c/o of bad cough for 5 days- SOB and wheezing- intermittent fevrer.    Review of Systems  Constitutional: Positive for fever, chills, appetite change and fatigue.  HENT: Positive for congestion, rhinorrhea and sinus pressure.   Respiratory: Positive for cough, shortness of breath and wheezing.   Cardiovascular: Negative.        Objective:   Physical Exam  Constitutional: She is oriented to person, place, and time. She appears well-developed and well-nourished.  HENT:  Right Ear: External ear normal.  Left Ear: External ear normal.  Nose: Nose normal.  Mouth/Throat: Oropharynx is clear and moist.  Eyes: EOM are normal. Pupils are equal, round, and reactive to light.  Neck: Normal range of motion. Neck supple.  Cardiovascular: Normal rate, regular rhythm and normal heart sounds.   Pulmonary/Chest: Effort normal. She has no wheezes (insp and exp).  Abdominal: Soft. Bowel sounds are normal.  Lymphadenopathy:    She has no cervical adenopathy.  Neurological: She is alert and oriented to person, place, and time.  Skin: Skin is warm and dry.  Psychiatric: She has a normal mood and affect. Her behavior is normal. Judgment and thought content normal.    S/p xopenex nebulizer-looser cough with decreased wheezing-Preliminary reading by Paulene Floor, FNP  [facility]    BP 144/88  Pulse 104  Temp(Src) 97.6 F (36.4 C) (Oral)  Wt 197 lb (89.359 kg)  SpO2 97%       Assessment & Plan:   1. COPD exacerbation    Meds ordered this encounter  Medications  . DISCONTD: albuterol (PROVENTIL HFA;VENTOLIN HFA) 108 (90 BASE) MCG/ACT inhaler    Sig: Inhale 2 puffs into the lungs every 4 (four) hours as needed for wheezing.    Dispense:  1 Inhaler    Refill:  2    Order Specific Question:  Supervising Provider    Answer:  Ernestina Penna [[id number]]  .  budesonide-formoterol (SYMBICORT) 80-4.5 MCG/ACT inhaler    Sig: Inhale 2 puffs into the lungs 2 (two) times daily.    Dispense:  1 Inhaler    Refill:  0    Order Specific Question:  Supervising Provider    Answer:  Ernestina Penna [[id number]]  . predniSONE (STERAPRED UNI-PAK) 10 MG tablet    Sig: Take by mouth daily. As directed X6 days    Dispense:  21 tablet    Refill:  0    Order Specific Question:  Supervising Provider    Answer:  Ernestina Penna [[id number]]  . levalbuterol (XOPENEX) nebulizer solution 1.25 mg    Sig:   . albuterol (PROVENTIL HFA;VENTOLIN HFA) 108 (90 BASE) MCG/ACT inhaler    Sig: Inhale 2 puffs into the lungs every 4 (four) hours as needed for wheezing.    Dispense:  1 Inhaler    Refill:  2    Order Specific Question:  Supervising Provider    Answer:  Ernestina Penna [[id number]]  . methylPREDNISolone acetate (DEPO-MEDROL) injection 80 mg    Sig:    Rest otc cough meds RTO prn  Mary-Margaret Daphine Deutscher, FNP

## 2012-12-30 ENCOUNTER — Other Ambulatory Visit: Payer: Self-pay

## 2012-12-30 DIAGNOSIS — M2550 Pain in unspecified joint: Secondary | ICD-10-CM

## 2012-12-30 MED ORDER — MELOXICAM 7.5 MG PO TABS: 7.5000 mg | ORAL_TABLET | Freq: Every day | ORAL | Status: DC

## 2012-12-31 ENCOUNTER — Other Ambulatory Visit: Payer: Self-pay | Admitting: Nurse Practitioner

## 2013-01-18 ENCOUNTER — Other Ambulatory Visit: Payer: Self-pay | Admitting: Family Medicine

## 2013-01-22 ENCOUNTER — Other Ambulatory Visit: Payer: Self-pay | Admitting: Nurse Practitioner

## 2013-01-24 ENCOUNTER — Other Ambulatory Visit: Payer: Self-pay | Admitting: *Deleted

## 2013-01-24 MED ORDER — POLYSACCHARIDE IRON COMPLEX 150 MG PO CAPS: ORAL_CAPSULE | ORAL | Status: DC

## 2013-03-19 ENCOUNTER — Other Ambulatory Visit: Payer: Self-pay | Admitting: Nurse Practitioner

## 2013-04-16 ENCOUNTER — Other Ambulatory Visit: Payer: Self-pay | Admitting: General Practice

## 2013-04-21 ENCOUNTER — Other Ambulatory Visit: Payer: Self-pay | Admitting: General Practice

## 2013-04-25 ENCOUNTER — Other Ambulatory Visit: Payer: Self-pay | Admitting: General Practice

## 2013-05-22 ENCOUNTER — Other Ambulatory Visit: Payer: Self-pay

## 2013-05-22 DIAGNOSIS — M2550 Pain in unspecified joint: Secondary | ICD-10-CM

## 2013-05-22 MED ORDER — DULOXETINE HCL 30 MG PO CPEP: ORAL_CAPSULE | ORAL | Status: DC

## 2013-05-22 MED ORDER — MECLIZINE HCL 25 MG PO TABS: ORAL_TABLET | ORAL | Status: DC

## 2013-05-22 MED ORDER — MELOXICAM 7.5 MG PO TABS: 7.5000 mg | ORAL_TABLET | Freq: Every day | ORAL | Status: DC

## 2013-05-22 NOTE — Telephone Encounter (Signed)
Last see 12/10/12  MMM

## 2013-05-22 NOTE — Telephone Encounter (Signed)
Last seen 12/22/12  MMM

## 2013-05-22 NOTE — Telephone Encounter (Signed)
Patient NTBS for follow up and lab work  

## 2013-05-22 NOTE — Telephone Encounter (Signed)
Last seen 12/10/12  MMM

## 2013-06-04 ENCOUNTER — Encounter: Payer: Self-pay | Admitting: Physician Assistant

## 2013-06-04 ENCOUNTER — Ambulatory Visit (INDEPENDENT_AMBULATORY_CARE_PROVIDER_SITE_OTHER): Payer: Medicare Other | Admitting: Physician Assistant

## 2013-06-04 ENCOUNTER — Ambulatory Visit (INDEPENDENT_AMBULATORY_CARE_PROVIDER_SITE_OTHER): Payer: Medicare Other

## 2013-06-04 VITALS — BP 151/79 | HR 88 | Temp 98.4°F | Ht 66.0 in | Wt 167.0 lb

## 2013-06-04 DIAGNOSIS — M25569 Pain in unspecified knee: Secondary | ICD-10-CM

## 2013-06-04 NOTE — Patient Instructions (Signed)

## 2013-06-04 NOTE — Progress Notes (Signed)
Subjective:     Patient ID: [name], female   DOB: [birth year], 80 y.o.   MRN: [id number]  HPI Pt with sudden onset of L knee pain yest Has a hx of degen arthritis and has had R knee replacement x 2 States she was walking normal yest and twisted When she did felt pain to the R knee  Review of Systems Denies any locking or giving way of the knee No swelling to the knee Taking her Mobic and Neurontin as usual    Objective:   Physical Exam NAD Using a walker but good gait No ecchy/effusion to the knee FROM of the knee w/o crepitus No laxity + TTP ant/medial knee Good strength distal Xray- no loose bodies noted    Assessment:     L knee pain- ? Patellar sublux     Plan:     Cont her current meds Heat/Ice F/U appt with her regular Ortho F/U here prn

## 2013-06-17 ENCOUNTER — Other Ambulatory Visit: Payer: Self-pay | Admitting: Nurse Practitioner

## 2013-06-20 NOTE — Telephone Encounter (Signed)
Patient NTBS for follow up and lab work  

## 2013-07-18 ENCOUNTER — Other Ambulatory Visit: Payer: Self-pay | Admitting: Nurse Practitioner

## 2013-07-31 ENCOUNTER — Ambulatory Visit (INDEPENDENT_AMBULATORY_CARE_PROVIDER_SITE_OTHER): Payer: Medicare Other | Admitting: Nurse Practitioner

## 2013-07-31 ENCOUNTER — Encounter: Payer: Self-pay | Admitting: Nurse Practitioner

## 2013-07-31 VITALS — BP 153/83 | HR 87 | Temp 97.5°F | Ht 66.0 in

## 2013-07-31 DIAGNOSIS — M25569 Pain in unspecified knee: Secondary | ICD-10-CM

## 2013-07-31 DIAGNOSIS — M25562 Pain in left knee: Principal | ICD-10-CM

## 2013-07-31 DIAGNOSIS — M25561 Pain in right knee: Secondary | ICD-10-CM

## 2013-07-31 MED ORDER — KETOROLAC TROMETHAMINE 60 MG/2ML IM SOLN
60.0000 mg | Freq: Once | INTRAMUSCULAR | Status: AC
  Administered 2013-07-31: 60 mg via INTRAMUSCULAR

## 2013-07-31 MED ORDER — DICLOFENAC SODIUM 1 % TD GEL: 4.0000 g | Freq: Four times a day (QID) | TRANSDERMAL | Status: DC

## 2013-07-31 NOTE — Patient Instructions (Signed)

## 2013-07-31 NOTE — Progress Notes (Signed)
   Subjective:    Patient ID: [name], female    DOB: 09/30/1928, 80 y.o.   MRN: [id number]  HPI Patient is here today following up on knee pain. She was seen in 5/27. X-ray showed mild ostearthritis with effusion. She is currently seeing an orthopedic. Currently on mobic.    Review of Systems  Constitutional: Negative.   HENT: Negative.   Eyes: Negative.   Respiratory: Negative.   Cardiovascular: Negative.   Gastrointestinal: Negative.   Endocrine: Negative.   Genitourinary: Negative.   Musculoskeletal: Positive for gait problem (uses a walker. ) and joint swelling.       Hx of right knee replacement.   Skin: Negative.   Allergic/Immunologic: Negative.   Neurological: Negative.   Hematological: Negative.   Psychiatric/Behavioral: Negative.        Objective:   Physical Exam  Constitutional: She is oriented to person, place, and time. She appears well-developed and well-nourished.  HENT:  Head: Normocephalic.  Right Ear: Hearing, tympanic membrane, external ear and ear canal normal.  Left Ear: Hearing, tympanic membrane, external ear and ear canal normal.  Nose: Nose normal.  Mouth/Throat: Uvula is midline, oropharynx is clear and moist and mucous membranes are normal.  Eyes: Conjunctivae and EOM are normal. Pupils are equal, round, and reactive to light.  Neck: Normal range of motion. Neck supple. No JVD present. No thyromegaly present.  Cardiovascular: Normal rate, normal heart sounds and intact distal pulses.   No murmur heard. Pulmonary/Chest: Effort normal and breath sounds normal. She has no wheezes. She has no rales.  Abdominal: Soft. Bowel sounds are normal. She exhibits no mass.  Musculoskeletal: Normal range of motion.  bil knee pain.   Neurological: She is alert and oriented to person, place, and time. She has normal reflexes.  Skin: Skin is warm and dry.  Psychiatric: She has a normal mood and affect. Her behavior is normal. Judgment and thought  content normal.   BP 153/83  Pulse 87  Temp(Src) 97.5 F (36.4 C) (Oral)  Ht 5\' 6"  (1.676 m)        Assessment & Plan:

## 2013-08-19 ENCOUNTER — Other Ambulatory Visit: Payer: Self-pay | Admitting: Nurse Practitioner

## 2013-08-20 NOTE — Telephone Encounter (Signed)
no more refills without being seen  

## 2013-08-20 NOTE — Telephone Encounter (Signed)
Has been seen several times for acute visits. Please advise on these refills. No chronic follow up since 9-14. Please advise

## 2013-09-05 ENCOUNTER — Telehealth: Payer: Self-pay | Admitting: Nurse Practitioner

## 2013-09-05 NOTE — Telephone Encounter (Signed)
appt given for Tuesday with Ronnald Collum and patients daughter aware that if she becomes sob to get her straight to the hospital

## 2013-09-09 ENCOUNTER — Encounter: Payer: Self-pay | Admitting: Nurse Practitioner

## 2013-09-09 ENCOUNTER — Ambulatory Visit (INDEPENDENT_AMBULATORY_CARE_PROVIDER_SITE_OTHER): Payer: Medicare Other | Admitting: Nurse Practitioner

## 2013-09-09 VITALS — BP 163/92 | HR 82 | Temp 97.6°F | Ht 66.0 in | Wt 168.0 lb

## 2013-09-09 DIAGNOSIS — K219 Gastro-esophageal reflux disease without esophagitis: Secondary | ICD-10-CM

## 2013-09-09 DIAGNOSIS — J441 Chronic obstructive pulmonary disease with (acute) exacerbation: Secondary | ICD-10-CM

## 2013-09-09 DIAGNOSIS — F419 Anxiety disorder, unspecified: Secondary | ICD-10-CM

## 2013-09-09 DIAGNOSIS — D500 Iron deficiency anemia secondary to blood loss (chronic): Secondary | ICD-10-CM

## 2013-09-09 DIAGNOSIS — F411 Generalized anxiety disorder: Secondary | ICD-10-CM

## 2013-09-09 MED ORDER — MECLIZINE HCL 25 MG PO TABS: ORAL_TABLET | ORAL | Status: DC

## 2013-09-09 MED ORDER — DULOXETINE HCL 30 MG PO CPEP: ORAL_CAPSULE | ORAL | Status: DC

## 2013-09-09 MED ORDER — MELOXICAM 7.5 MG PO TABS: ORAL_TABLET | ORAL | Status: DC

## 2013-09-09 MED ORDER — ALBUTEROL SULFATE HFA 108 (90 BASE) MCG/ACT IN AERS: 2.0000 | INHALATION_SPRAY | Freq: Four times a day (QID) | RESPIRATORY_TRACT | Status: DC | PRN

## 2013-09-09 NOTE — Progress Notes (Signed)
Subjective:    Patient ID: [name], female    DOB: Oct 23, 1928, 80 y.o.   MRN: [id number]  HPI patient brought in today by her daughters for follow up. They say that she for 2 weeks her middle fingers on both sides ar turning completely blue, denies being cold to the touch or numb. Last as long as a week when occurs- Almost looks like it is bruised. Goes away and comes back. SHe does have a long history of COPD and gets to wheezing at times. They also say that she fell several weeks ago bit she said she reached for vacuum cleaner and reached to fall and just slid down the wall.    Review of Systems  Constitutional: Negative.   HENT: Negative.   Respiratory: Negative.   Cardiovascular: Negative.   Genitourinary: Negative.   Neurological: Negative.   Psychiatric/Behavioral: Negative.   All other systems reviewed and are negative.      Objective:   Physical Exam  Constitutional: She is oriented to person, place, and time. She appears well-developed and well-nourished.  Cardiovascular: Normal rate, regular rhythm and normal heart sounds.   SAO2 97%  Pulmonary/Chest: Effort normal and breath sounds normal.  Musculoskeletal:  Brick cap refill in bil hands  Neurological: She is alert and oriented to person, place, and time.  Skin: Skin is warm.  Psychiatric: She has a normal mood and affect. Her behavior is normal. Judgment and thought content normal.   BP 163/92  Pulse 82  Temp(Src) 97.6 F (36.4 C) (Oral)  Ht _0  (1.676 m)  Wt 168 lb (76.204 kg)  BMI 27.13 kg/m2        Assessment & Plan:   1. Gastroesophageal reflux disease without esophagitis   2. Anxiety   3. Iron deficiency anemia due to chronic blood loss   4. COPD with exacerbation    Orders Placed This Encounter  Procedures  . CMP14+EGFR  . Anemia Profile B   Meds ordered this encounter  Medications  . DISCONTD: PROAIR HFA 108 (90 BASE) MCG/ACT inhaler    Sig:   . albuterol (PROAIR HFA) 108  (90 BASE) MCG/ACT inhaler    Sig: Inhale 2 puffs into the lungs every 6 (six) hours as needed for wheezing or shortness of breath.    Dispense:  18 g    Refill:  3    Order Specific Question:  Supervising Provider    Answer:  Chipper Herb [[id number]]  . meloxicam (MOBIC) 7.5 MG tablet    Sig: TAKE 1 TABLET (7.5 MG TOTAL) BY MOUTH DAILY.    Dispense:  30 tablet    Refill:  5    Order Specific Question:  Supervising Provider    Answer:  Chipper Herb [[id number]]  . meclizine (ANTIVERT) 25 MG tablet    Sig: TAKE 1 TABLET BY MOUTH TWICE A DAY    Dispense:  60 tablet    Refill:  5    Order Specific Question:  Supervising Provider    Answer:  Chipper Herb [[id number]]  . DULoxetine (CYMBALTA) 30 MG capsule    Sig: TAKE 1 CAPSULE (30 MG TOTAL) BY MOUTH DAILY.    Dispense:  30 capsule    Refill:  5    Order Specific Question:  Supervising Provider    Answer:  Chipper Herb [[id number]]    Labs pending Health maintenance reviewed Diet and exercise encouraged Continue all meds Follow up  In 68month  Amy  Hassell Shaffer, [place]

## 2013-09-09 NOTE — Patient Instructions (Signed)

## 2013-09-10 LAB — ANEMIA PROFILE B
BASOS ABS: 0 10*3/uL (ref 0.0–0.2)
Basos: 0 %
Eos: 0 %
Eosinophils Absolute: 0 10*3/uL (ref 0.0–0.4)
Ferritin: 22 ng/mL (ref 15–150)
Folate: 11.3 ng/mL (ref >3.0)
HEMATOCRIT: 44.1 % (ref 34.0–46.6)
HEMOGLOBIN: 14.6 g/dL (ref 11.1–15.9)
IRON SATURATION: 9 % — AB (ref 15–55)
IRON: 34 ug/dL — AB (ref 35–155)
Immature Grans (Abs): 0 10*3/uL (ref 0.0–0.1)
Immature Granulocytes: 0 %
LYMPHS ABS: 1.5 10*3/uL (ref 0.7–3.1)
Lymphs: 16 %
MCH: 28.2 pg (ref 26.6–33.0)
MCHC: 33.1 g/dL (ref 31.5–35.7)
MCV: 85 fL (ref 79–97)
Monocytes Absolute: 1.1 10*3/uL — ABNORMAL HIGH (ref 0.1–0.9)
Monocytes: 12 %
Neutrophils Absolute: 6.8 10*3/uL (ref 1.4–7.0)
Neutrophils Relative %: 72 %
Platelets: 419 10*3/uL — ABNORMAL HIGH (ref 150–379)
RBC: 5.18 x10E6/uL (ref 3.77–5.28)
RDW: 16.1 % — AB (ref 12.3–15.4)
Retic Ct Pct: 1.5 % (ref 0.6–2.6)
TIBC: 381 ug/dL (ref 250–450)
UIBC: 347 ug/dL (ref 150–375)
Vitamin B-12: 272 pg/mL (ref 211–946)
WBC: 9.4 10*3/uL (ref 3.4–10.8)

## 2013-09-10 LAB — CMP14+EGFR
ALT: 9 IU/L (ref 0–32)
AST: 15 IU/L (ref 0–40)
Albumin/Globulin Ratio: 1.8 (ref 1.1–2.5)
Albumin: 4.6 g/dL (ref 3.5–4.7)
Alkaline Phosphatase: 121 IU/L — ABNORMAL HIGH (ref 39–117)
BILIRUBIN TOTAL: 0.4 mg/dL (ref 0.0–1.2)
BUN/Creatinine Ratio: 14 (ref 11–26)
BUN: 14 mg/dL (ref 8–27)
CALCIUM: 10.1 mg/dL (ref 8.7–10.3)
CHLORIDE: 96 mmol/L — AB (ref 97–108)
CO2: 27 mmol/L (ref 18–29)
Creatinine, Ser: 0.98 mg/dL (ref 0.57–1.00)
GFR calc Af Amer: 62 mL/min/{1.73_m2} (ref >59)
GFR calc non Af Amer: 54 mL/min/{1.73_m2} — ABNORMAL LOW (ref >59)
GLUCOSE: 98 mg/dL (ref 65–99)
Globulin, Total: 2.6 g/dL (ref 1.5–4.5)
POTASSIUM: 3.6 mmol/L (ref 3.5–5.2)
Sodium: 140 mmol/L (ref 134–144)
Total Protein: 7.2 g/dL (ref 6.0–8.5)

## 2013-12-01 ENCOUNTER — Ambulatory Visit (INDEPENDENT_AMBULATORY_CARE_PROVIDER_SITE_OTHER): Payer: Medicare Other

## 2013-12-01 ENCOUNTER — Ambulatory Visit (INDEPENDENT_AMBULATORY_CARE_PROVIDER_SITE_OTHER): Payer: Medicare Other | Admitting: Nurse Practitioner

## 2013-12-01 VITALS — BP 142/72 | HR 72 | Temp 98.3°F | Ht 66.0 in

## 2013-12-01 DIAGNOSIS — M25552 Pain in left hip: Secondary | ICD-10-CM

## 2013-12-01 DIAGNOSIS — W19XXXA Unspecified fall, initial encounter: Secondary | ICD-10-CM

## 2013-12-01 DIAGNOSIS — M25532 Pain in left wrist: Secondary | ICD-10-CM

## 2013-12-01 DIAGNOSIS — S52502A Unspecified fracture of the lower end of left radius, initial encounter for closed fracture: Secondary | ICD-10-CM

## 2013-12-01 NOTE — Progress Notes (Signed)
   Subjective:    Patient ID: [name], female    DOB: [birth year], 80 y.o.   MRN: [id number]  HPI Patient fell at home and injured her left wrist. Painful to touch. Slight left hip pain    Review of Systems  Constitutional: Negative.   Respiratory: Negative.   Cardiovascular: Negative.   Genitourinary: Negative.   Neurological: Negative.   Psychiatric/Behavioral: Negative.   All other systems reviewed and are negative.      Objective:   Physical Exam  Constitutional: She is oriented to person, place, and time. She appears well-developed and well-nourished.  Cardiovascular: Normal rate, regular rhythm and normal heart sounds.   Pulmonary/Chest: Effort normal and breath sounds normal.  Musculoskeletal:  Decrease ROM of left wrist due to pain with any movement Slight edema  Neurological: She is alert and oriented to person, place, and time.  Skin: Skin is warm and dry.  Psychiatric: She has a normal mood and affect. Her behavior is normal. Judgment and thought content normal.   Left wrist x ray- displace fracture of left distal radius-Preliminary reading by Ronnald Collum, FNP  [facility] Left hip xray- normal-Preliminary reading by Ronnald Collum, FNP  [facility]         Assessment & Plan:   1. Fall, initial encounter   2. Displaced fracture of distal end of left radius    Orders Placed This Encounter  Procedures  . DG Wrist Complete Left    Standing Status: Future     Number of Occurrences: 1     Standing Expiration Date: 01/31/2015    Order Specific Question:  Reason for Exam (SYMPTOM  OR DIAGNOSIS REQUIRED)    Answer:  fall    Order Specific Question:  Preferred imaging location?    Answer:  Internal  . DG Hip Complete Left    Standing Status: Future     Number of Occurrences: 1     Standing Expiration Date: 01/31/2015    Order Specific Question:  Reason for Exam (SYMPTOM  OR DIAGNOSIS REQUIRED)    Answer:  fall    Order Specific Question:  Preferred imaging location?     Answer:  Internal  . Ambulatory referral to Orthopedic Surgery    Referral Priority:  Routine    Referral Type:  Surgical    Referral Reason:  Specialty Services Required    Referred to Provider:  Roseanne Kaufman, MD    Requested Specialty:  Orthopedic Surgery    Number of Visits Requested:  1   To dr.[email or web address] now  Mary-Margaret Hassell Done, FNP

## 2013-12-02 ENCOUNTER — Other Ambulatory Visit: Payer: Self-pay | Admitting: Nurse Practitioner

## 2014-01-12 DIAGNOSIS — S52532D Colles' fracture of left radius, subsequent encounter for closed fracture with routine healing: Secondary | ICD-10-CM | POA: Diagnosis not present

## 2014-01-28 ENCOUNTER — Other Ambulatory Visit: Payer: Self-pay | Admitting: Nurse Practitioner

## 2014-01-31 ENCOUNTER — Other Ambulatory Visit: Payer: Self-pay | Admitting: *Deleted

## 2014-01-31 MED ORDER — POLYSACCHARIDE IRON COMPLEX 150 MG PO CAPS: 150.0000 mg | ORAL_CAPSULE | Freq: Every day | ORAL | Status: DC

## 2014-02-21 DIAGNOSIS — S52531D Colles' fracture of right radius, subsequent encounter for closed fracture with routine healing: Secondary | ICD-10-CM | POA: Diagnosis not present

## 2014-03-24 ENCOUNTER — Other Ambulatory Visit: Payer: Self-pay | Admitting: Nurse Practitioner

## 2014-04-10 ENCOUNTER — Encounter: Payer: Self-pay | Admitting: *Deleted

## 2014-04-10 ENCOUNTER — Ambulatory Visit (INDEPENDENT_AMBULATORY_CARE_PROVIDER_SITE_OTHER): Payer: Medicare Other

## 2014-04-10 ENCOUNTER — Ambulatory Visit (INDEPENDENT_AMBULATORY_CARE_PROVIDER_SITE_OTHER): Payer: Medicare Other | Admitting: *Deleted

## 2014-04-10 ENCOUNTER — Ambulatory Visit: Payer: Medicare Other

## 2014-04-10 DIAGNOSIS — Z780 Asymptomatic menopausal state: Secondary | ICD-10-CM | POA: Diagnosis not present

## 2014-04-10 DIAGNOSIS — Z23 Encounter for immunization: Secondary | ICD-10-CM | POA: Diagnosis not present

## 2014-04-10 DIAGNOSIS — N959 Unspecified menopausal and perimenopausal disorder: Secondary | ICD-10-CM

## 2014-04-10 DIAGNOSIS — Z0000 Encounter for general adult medical examination without abnormal findings: Secondary | ICD-10-CM

## 2014-04-10 NOTE — Patient Instructions (Addendum)
Preventive Care for Adults A healthy lifestyle and preventive care can promote health and wellness. Preventive health guidelines for women include the following key practices.  A routine yearly physical is a good way to check with your health care provider about your health and preventive screening. It is a chance to share any concerns and updates on your health and to receive a thorough exam.  Visit your dentist for a routine exam and preventive care every 6 months. Brush your teeth twice a day and floss once a day. Good oral hygiene prevents tooth decay and gum disease.  The frequency of eye exams is based on your age, health, family medical history, use of contact lenses, and other factors. Follow your health care provider's recommendations for frequency of eye exams.  Eat a healthy diet. Foods like vegetables, fruits, whole grains, low-fat dairy products, and lean protein foods contain the nutrients you need without too many calories. Decrease your intake of foods high in solid fats, added sugars, and salt. Eat the right amount of calories for you.Get information about a proper diet from your health care provider, if necessary.  Regular physical exercise is one of the most important things you can do for your health. Most adults should get at least 150 minutes of moderate-intensity exercise (any activity that increases your heart rate and causes you to sweat) each week. In addition, most adults need muscle-strengthening exercises on 2 or more days a week.  Maintain a healthy weight. The body mass index (BMI) is a screening tool to identify possible weight problems. It provides an estimate of body fat based on height and weight. Your health care provider can find your BMI and can help you achieve or maintain a healthy weight.For adults 20 years and older:  A BMI below 18.5 is considered underweight.  A BMI of 18.5 to 24.9 is normal.  A BMI of 25 to 29.9 is considered overweight.  A BMI of  30 and above is considered obese.  Maintain normal blood lipids and cholesterol levels by exercising and minimizing your intake of saturated fat. Eat a balanced diet with plenty of fruit and vegetables. Blood tests for lipids and cholesterol should begin at age 76 and be repeated every 5 years. If your lipid or cholesterol levels are high, you are over 50, or you are at high risk for heart disease, you may need your cholesterol levels checked more frequently.Ongoing high lipid and cholesterol levels should be treated with medicines if diet and exercise are not working.  If you smoke, find out from your health care provider how to quit. If you do not use tobacco, do not start.  Lung cancer screening is recommended for adults aged 22-80 years who are at high risk for developing lung cancer because of a history of smoking. A yearly low-dose CT scan of the lungs is recommended for people who have at least a 30-pack-year history of smoking and are a current smoker or have quit within the past 15 years. A pack year of smoking is smoking an average of 1 pack of cigarettes a day for 1 year (for example: 1 pack a day for 30 years or 2 packs a day for 15 years). Yearly screening should continue until the smoker has stopped smoking for at least 15 years. Yearly screening should be stopped for people who develop a health problem that would prevent them from having lung cancer treatment.  If you are pregnant, do not drink alcohol. If you are breastfeeding,  be very cautious about drinking alcohol. If you are not pregnant and choose to drink alcohol, do not have more than 1 drink per day. One drink is considered to be 12 ounces (355 mL) of beer, 5 ounces (148 mL) of wine, or 1.5 ounces (44 mL) of liquor.  Avoid use of street drugs. Do not share needles with anyone. Ask for help if you need support or instructions about stopping the use of drugs.  High blood pressure causes heart disease and increases the risk of  stroke. Your blood pressure should be checked at least every 1 to 2 years. Ongoing high blood pressure should be treated with medicines if weight loss and exercise do not work.  If you are 75-52 years old, ask your health care provider if you should take aspirin to prevent strokes.  Diabetes screening involves taking a blood sample to check your fasting blood sugar level. This should be done once every 3 years, after age 15, if you are within normal weight and without risk factors for diabetes. Testing should be considered at a younger age or be carried out more frequently if you are overweight and have at least 1 risk factor for diabetes.  Breast cancer screening is essential preventive care for women. You should practice "breast self-awareness." This means understanding the normal appearance and feel of your breasts and may include breast self-examination. Any changes detected, no matter how small, should be reported to a health care provider. Women in their 58s and 30s should have a clinical breast exam (CBE) by a health care provider as part of a regular health exam every 1 to 3 years. After age 16, women should have a CBE every year. Starting at age 53, women should consider having a mammogram (breast X-ray test) every year. Women who have a family history of breast cancer should talk to their health care provider about genetic screening. Women at a high risk of breast cancer should talk to their health care providers about having an MRI and a mammogram every year.  Breast cancer gene (BRCA)-related cancer risk assessment is recommended for women who have family members with BRCA-related cancers. BRCA-related cancers include breast, ovarian, tubal, and peritoneal cancers. Having family members with these cancers may be associated with an increased risk for harmful changes (mutations) in the breast cancer genes BRCA1 and BRCA2. Results of the assessment will determine the need for genetic counseling and  BRCA1 and BRCA2 testing.  Routine pelvic exams to screen for cancer are no longer recommended for nonpregnant women who are considered low risk for cancer of the pelvic organs (ovaries, uterus, and vagina) and who do not have symptoms. Ask your health care provider if a screening pelvic exam is right for you.  If you have had past treatment for cervical cancer or a condition that could lead to cancer, you need Pap tests and screening for cancer for at least 20 years after your treatment. If Pap tests have been discontinued, your risk factors (such as having a new sexual partner) need to be reassessed to determine if screening should be resumed. Some women have medical problems that increase the chance of getting cervical cancer. In these cases, your health care provider may recommend more frequent screening and Pap tests.  The HPV test is an additional test that may be used for cervical cancer screening. The HPV test looks for the virus that can cause the cell changes on the cervix. The cells collected during the Pap test can be  tested for HPV. The HPV test could be used to screen women aged 30 years and older, and should be used in women of any age who have unclear Pap test results. After the age of 30, women should have HPV testing at the same frequency as a Pap test.  Colorectal cancer can be detected and often prevented. Most routine colorectal cancer screening begins at the age of 50 years and continues through age 75 years. However, your health care provider may recommend screening at an earlier age if you have risk factors for colon cancer. On a yearly basis, your health care provider may provide home test kits to check for hidden blood in the stool. Use of a small camera at the end of a tube, to directly examine the colon (sigmoidoscopy or colonoscopy), can detect the earliest forms of colorectal cancer. Talk to your health care provider about this at age 50, when routine screening begins. Direct  exam of the colon should be repeated every 5-10 years through age 75 years, unless early forms of pre-cancerous polyps or small growths are found.  People who are at an increased risk for hepatitis B should be screened for this virus. You are considered at high risk for hepatitis B if:  You were born in a country where hepatitis B occurs often. Talk with your health care provider about which countries are considered high risk.  Your parents were born in a high-risk country and you have not received a shot to protect against hepatitis B (hepatitis B vaccine).  You have HIV or AIDS.  You use needles to inject street drugs.  You live with, or have sex with, someone who has hepatitis B.  You get hemodialysis treatment.  You take certain medicines for conditions like cancer, organ transplantation, and autoimmune conditions.  Hepatitis C blood testing is recommended for all people born from 1945 through 1965 and any individual with known risks for hepatitis C.  Practice safe sex. Use condoms and avoid high-risk sexual practices to reduce the spread of sexually transmitted infections (STIs). STIs include gonorrhea, chlamydia, syphilis, trichomonas, herpes, HPV, and human immunodeficiency virus (HIV). Herpes, HIV, and HPV are viral illnesses that have no cure. They can result in disability, cancer, and death.  You should be screened for sexually transmitted illnesses (STIs) including gonorrhea and chlamydia if:  You are sexually active and are younger than 24 years.  You are older than 24 years and your health care provider tells you that you are at risk for this type of infection.  Your sexual activity has changed since you were last screened and you are at an increased risk for chlamydia or gonorrhea. Ask your health care provider if you are at risk.  If you are at risk of being infected with HIV, it is recommended that you take a prescription medicine daily to prevent HIV infection. This is  called preexposure prophylaxis (PrEP). You are considered at risk if:  You are a heterosexual woman, are sexually active, and are at increased risk for HIV infection.  You take drugs by injection.  You are sexually active with a partner who has HIV.  Talk with your health care provider about whether you are at high risk of being infected with HIV. If you choose to begin PrEP, you should first be tested for HIV. You should then be tested every 3 months for as long as you are taking PrEP.  Osteoporosis is a disease in which the bones lose minerals and strength   with aging. This can result in serious bone fractures or breaks. The risk of osteoporosis can be identified using a bone density scan. Women ages 65 years and over and women at risk for fractures or osteoporosis should discuss screening with their health care providers. Ask your health care provider whether you should take a calcium supplement or vitamin D to reduce the rate of osteoporosis.  Menopause can be associated with physical symptoms and risks. Hormone replacement therapy is available to decrease symptoms and risks. You should talk to your health care provider about whether hormone replacement therapy is right for you.  Use sunscreen. Apply sunscreen liberally and repeatedly throughout the day. You should seek shade when your shadow is shorter than you. Protect yourself by wearing long sleeves, pants, a wide-brimmed hat, and sunglasses year round, whenever you are outdoors.  Once a month, do a whole body skin exam, using a mirror to look at the skin on your back. Tell your health care provider of new moles, moles that have irregular borders, moles that are larger than a pencil eraser, or moles that have changed in shape or color.  Stay current with required vaccines (immunizations).  Influenza vaccine. All adults should be immunized every year.  Tetanus, diphtheria, and acellular pertussis (Td, Tdap) vaccine. Pregnant women should  receive 1 dose of Tdap vaccine during each pregnancy. The dose should be obtained regardless of the length of time since the last dose. Immunization is preferred during the 27th-36th week of gestation. An adult who has not previously received Tdap or who does not know her vaccine status should receive 1 dose of Tdap. This initial dose should be followed by tetanus and diphtheria toxoids (Td) booster doses every 10 years. Adults with an unknown or incomplete history of completing a 3-dose immunization series with Td-containing vaccines should begin or complete a primary immunization series including a Tdap dose. Adults should receive a Td booster every 10 years.  Varicella vaccine. An adult without evidence of immunity to varicella should receive 2 doses or a second dose if she has previously received 1 dose. Pregnant females who do not have evidence of immunity should receive the first dose after pregnancy. This first dose should be obtained before leaving the health care facility. The second dose should be obtained 4-8 weeks after the first dose.  Human papillomavirus (HPV) vaccine. Females aged 13-26 years who have not received the vaccine previously should obtain the 3-dose series. The vaccine is not recommended for use in pregnant females. However, pregnancy testing is not needed before receiving a dose. If a female is found to be pregnant after receiving a dose, no treatment is needed. In that case, the remaining doses should be delayed until after the pregnancy. Immunization is recommended for any person with an immunocompromised condition through the age of 26 years if she did not get any or all doses earlier. During the 3-dose series, the second dose should be obtained 4-8 weeks after the first dose. The third dose should be obtained 24 weeks after the first dose and 16 weeks after the second dose.  Zoster vaccine. One dose is recommended for adults aged 60 years or older unless certain conditions are  present.  Measles, mumps, and rubella (MMR) vaccine. Adults born before 1957 generally are considered immune to measles and mumps. Adults born in 1957 or later should have 1 or more doses of MMR vaccine unless there is a contraindication to the vaccine or there is laboratory evidence of immunity to   each of the three diseases. A routine second dose of MMR vaccine should be obtained at least 28 days after the first dose for students attending postsecondary schools, health care workers, or international travelers. People who received inactivated measles vaccine or an unknown type of measles vaccine during 1963-1967 should receive 2 doses of MMR vaccine. People who received inactivated mumps vaccine or an unknown type of mumps vaccine before 1979 and are at high risk for mumps infection should consider immunization with 2 doses of MMR vaccine. For females of childbearing age, rubella immunity should be determined. If there is no evidence of immunity, females who are not pregnant should be vaccinated. If there is no evidence of immunity, females who are pregnant should delay immunization until after pregnancy. Unvaccinated health care workers born before 1957 who lack laboratory evidence of measles, mumps, or rubella immunity or laboratory confirmation of disease should consider measles and mumps immunization with 2 doses of MMR vaccine or rubella immunization with 1 dose of MMR vaccine.  Pneumococcal 13-valent conjugate (PCV13) vaccine. When indicated, a person who is uncertain of her immunization history and has no record of immunization should receive the PCV13 vaccine. An adult aged 19 years or older who has certain medical conditions and has not been previously immunized should receive 1 dose of PCV13 vaccine. This PCV13 should be followed with a dose of pneumococcal polysaccharide (PPSV23) vaccine. The PPSV23 vaccine dose should be obtained at least 8 weeks after the dose of PCV13 vaccine. An adult aged 19  years or older who has certain medical conditions and previously received 1 or more doses of PPSV23 vaccine should receive 1 dose of PCV13. The PCV13 vaccine dose should be obtained 1 or more years after the last PPSV23 vaccine dose.  Pneumococcal polysaccharide (PPSV23) vaccine. When PCV13 is also indicated, PCV13 should be obtained first. All adults aged 65 years and older should be immunized. An adult younger than age 65 years who has certain medical conditions should be immunized. Any person who resides in a nursing home or long-term care facility should be immunized. An adult smoker should be immunized. People with an immunocompromised condition and certain other conditions should receive both PCV13 and PPSV23 vaccines. People with human immunodeficiency virus (HIV) infection should be immunized as soon as possible after diagnosis. Immunization during chemotherapy or radiation therapy should be avoided. Routine use of PPSV23 vaccine is not recommended for American Indians, [place] Natives, or people younger than 65 years unless there are medical conditions that require PPSV23 vaccine. When indicated, people who have unknown immunization and have no record of immunization should receive PPSV23 vaccine. One-time revaccination 5 years after the first dose of PPSV23 is recommended for people aged 19-64 years who have chronic kidney failure, nephrotic syndrome, asplenia, or immunocompromised conditions. People who received 1-2 doses of PPSV23 before age 65 years should receive another dose of PPSV23 vaccine at age 65 years or later if at least 5 years have passed since the previous dose. Doses of PPSV23 are not needed for people immunized with PPSV23 at or after age 65 years.  Meningococcal vaccine. Adults with asplenia or persistent complement component deficiencies should receive 2 doses of quadrivalent meningococcal conjugate (MenACWY-D) vaccine. The doses should be obtained at least 2 months apart.  Microbiologists working with certain meningococcal bacteria, military recruits, people at risk during an outbreak, and people who travel to or live in countries with a high rate of meningitis should be immunized. A first-year college student up through age   21 years who is living in a residence hall should receive a dose if she did not receive a dose on or after her 16th birthday. Adults who have certain high-risk conditions should receive one or more doses of vaccine.  Hepatitis A vaccine. Adults who wish to be protected from this disease, have certain high-risk conditions, work with hepatitis A-infected animals, work in hepatitis A research labs, or travel to or work in countries with a high rate of hepatitis A should be immunized. Adults who were previously unvaccinated and who anticipate close contact with an international adoptee during the first 60 days after arrival in the [place] from a country with a high rate of hepatitis A should be immunized.  Hepatitis B vaccine. Adults who wish to be protected from this disease, have certain high-risk conditions, may be exposed to blood or other infectious body fluids, are household contacts or sex partners of hepatitis B positive people, are clients or workers in certain care facilities, or travel to or work in countries with a high rate of hepatitis B should be immunized.  Haemophilus influenzae type b (Hib) vaccine. A previously unvaccinated person with asplenia or sickle cell disease or having a scheduled splenectomy should receive 1 dose of Hib vaccine. Regardless of previous immunization, a recipient of a hematopoietic stem cell transplant should receive a 3-dose series 6-12 months after her successful transplant. Hib vaccine is not recommended for adults with HIV infection.  Preventive Services / Frequency Ages 54 years and over  Blood pressure check.** / Every 1 to 2 years.  Lipid and cholesterol check.** / Every 5 years beginning at age 67  years.  Lung cancer screening. / Every year if you are aged 30-80 years and have a 30-pack-year history of smoking and currently smoke or have quit within the past 15 years. Yearly screening is stopped once you have quit smoking for at least 15 years or develop a health problem that would prevent you from having lung cancer treatment.  Clinical breast exam.** / Every year after age 69 years.  BRCA-related cancer risk assessment.** / For women who have family members with a BRCA-related cancer (breast, ovarian, tubal, or peritoneal cancers).  Mammogram.** / Every year beginning at age 26 years and continuing for as long as you are in good health. Consult with your health care provider.  Pap test.** / Every 3 years starting at age 6 years through age 85 or 12 years with 3 consecutive normal Pap tests. Testing can be stopped between 65 and 70 years with 3 consecutive normal Pap tests and no abnormal Pap or HPV tests in the past 10 years.  HPV screening.** / Every 3 years from ages 77 years through ages 12 or 37 years with a history of 3 consecutive normal Pap tests. Testing can be stopped between 65 and 70 years with 3 consecutive normal Pap tests and no abnormal Pap or HPV tests in the past 10 years.  Fecal occult blood test (FOBT) of stool. / Every year beginning at age 25 years and continuing until age 46 years. You may not need to do this test if you get a colonoscopy every 10 years.  Flexible sigmoidoscopy or colonoscopy.** / Every 5 years for a flexible sigmoidoscopy or every 10 years for a colonoscopy beginning at age 58 years and continuing until age 54 years.  Hepatitis C blood test.** / For all people born from [birth year] through 1965 and any individual with known risks for hepatitis C.  Osteoporosis  screening.** / A one-time screening for women ages 14 years and over and women at risk for fractures or osteoporosis.  Skin self-exam. / Monthly.  Influenza vaccine. / Every year.  Tetanus,  diphtheria, and acellular pertussis (Tdap/Td) vaccine.** / 1 dose of Td every 10 years.  Varicella vaccine.** / Consult your health care provider.  Zoster vaccine.** / 1 dose for adults aged 16 years or older.  Pneumococcal 13-valent conjugate (PCV13) vaccine.** / Consult your health care provider.  Pneumococcal polysaccharide (PPSV23) vaccine.** / 1 dose for all adults aged 90 years and older.  Meningococcal vaccine.** / Consult your health care provider.  Hepatitis A vaccine.** / Consult your health care provider.  Hepatitis B vaccine.** / Consult your health care provider.  Haemophilus influenzae type b (Hib) vaccine.** / Consult your health care provider. ** Family history and personal history of risk and conditions may change your health care provider's recommendations. Document Released: 02/21/2001 Document Revised: 05/12/2013 Document Reviewed: 05/23/2010 [facility] Patient Information 2015 [place], [place]. This information is not intended to replace advice given to you by your health care provider. Make sure you discuss any questions you have with your health care provider.   Shingles Vaccine What You Need to Know WHAT IS SHINGLES?  Shingles is a painful skin rash, often with blisters. It is also called Herpes Zoster or just Zoster.  A shingles rash usually appears on one side of the face or body and lasts from 2 to 4 weeks. Its main symptom is pain, which can be quite severe. Other symptoms of shingles can include fever, headache, chills, and upset stomach. Very rarely, a shingles infection can lead to pneumonia, hearing problems, blindness, brain inflammation (encephalitis), or death.  For about 1 person in 5, severe pain can continue even after the rash clears up. This is called post-herpetic neuralgia.  Shingles is caused by the Varicella Zoster virus. This is the same virus that causes chickenpox. Only someone who has had a case of chickenpox or rarely, has gotten  chickenpox vaccine, can get shingles. The virus stays in your body. It can reappear many years later to cause a case of shingles.  You cannot catch shingles from another person with shingles. However, a person who has never had chickenpox (or chickenpox vaccine) could get chickenpox from someone with shingles. This is not very common.  Shingles is far more common in people 18 and older than in younger people. It is also more common in people whose immune systems are weakened because of a disease such as cancer or drugs such as steroids or chemotherapy.  At least 1 million people get shingles per year in the [place]. SHINGLES VACCINE  A vaccine for shingles was licensed in 6759. In clinical trials, the vaccine reduced the risk of shingles by 50%. It can also reduce the pain in people who still get shingles after being vaccinated.  A single dose of shingles vaccine is recommended for adults 37 years of age and older. SOME PEOPLE SHOULD NOT GET SHINGLES VACCINE OR SHOULD WAIT A person should not get shingles vaccine if he or she:  Has ever had a life-threatening allergic reaction to gelatin, the antibiotic neomycin, or any other component of shingles vaccine. Tell your caregiver if you have any severe allergies.  Has a weakened immune system because of current:  AIDS or another disease that affects the immune system.  Treatment with drugs that affect the immune system, such as prolonged use of high-dose steroids.  Cancer treatment, such  as radiation or chemotherapy.  Cancer affecting the bone marrow or lymphatic system, such as leukemia or lymphoma.  Is pregnant, or might be pregnant. Women should not become pregnant until at least 4 weeks after getting shingles vaccine. Someone with a minor illness, such as a cold, may be vaccinated. Anyone with a moderate or severe acute illness should usually wait until he or she recovers before getting the vaccine. This includes anyone with a  temperature of 101.3 F (38 C) or higher. WHAT ARE THE RISKS FROM SHINGLES VACCINE?  A vaccine, like any medicine, could possibly cause serious problems, such as severe allergic reactions. However, the risk of a vaccine causing serious harm, or death, is extremely small.  No serious problems have been identified with shingles vaccine. Mild Problems  Redness, soreness, swelling, or itching at the site of the injection (about 1 person in 3).  Headache (about 1 person in 84). Like all vaccines, shingles vaccine is being closely monitored for unusual or severe problems. WHAT IF THERE IS A MODERATE OR SEVERE REACTION? What should I look for? Any unusual condition, such as a severe allergic reaction or a high fever. If a severe allergic reaction occurred, it would be within a few minutes to an hour after the shot. Signs of a serious allergic reaction can include difficulty breathing, weakness, hoarseness or wheezing, a fast heartbeat, hives, dizziness, paleness, or swelling of the throat. What should I do?  Call your caregiver, or get the person to a caregiver right away.  Tell the caregiver what happened, the date and time it happened, and when the vaccination was given.  Ask the caregiver to report the reaction by filing a Vaccine Adverse Event Reporting System (VAERS) form. Or, you can file this report through the VAERS web site at www.vaers.SamedayNews.es or by calling 980-667-3827. VAERS does not provide medical advice. HOW CAN I LEARN MORE?  Ask your caregiver. He or she can give you the vaccine package insert or suggest other sources of information.  Contact the Centers for Disease Control and Prevention (CDC):  Call 318-042-3221 (1-800-CDC-INFO).  Visit the CDC website at http://hunter.com/ CDC Shingles Vaccine VIS (10/15/07) Document Released: 10/23/2005 Document Revised: 03/20/2011 Document Reviewed: 04/17/2012 [facility] Patient Information 2015 [place]. This information  is not intended to replace advice given to you by your health care provider. Make sure you discuss any questions you have with your health care provider.    Tdap Vaccine (Tetanus, Diphtheria, Pertussis): What You Need to Know 1. Why get vaccinated? Tetanus, diphtheria and pertussis can be very serious diseases, even for adolescents and adults. Tdap vaccine can protect [place] from these diseases. TETANUS (Lockjaw) causes painful muscle tightening and stiffness, usually all over the body.  It can lead to tightening of muscles in the head and neck so you can't open your mouth, swallow, or sometimes even breathe. Tetanus kills about 1 out of 5 people who are infected. DIPHTHERIA can cause a thick coating to form in the back of the throat.  It can lead to breathing problems, paralysis, heart failure, and death. PERTUSSIS (Whooping Cough) causes severe coughing spells, which can cause difficulty breathing, vomiting and disturbed sleep.  It can also lead to weight loss, incontinence, and rib fractures. Up to 2 in 100 adolescents and 5 in 100 adults with pertussis are hospitalized or have complications, which could include pneumonia or death. These diseases are caused by bacteria. Diphtheria and pertussis are spread from person to person through coughing or sneezing. Tetanus  enters the body through cuts, scratches, or wounds. Before vaccines, the [place] saw as many as 200,000 cases a year of diphtheria and pertussis, and hundreds of cases of tetanus. Since vaccination began, tetanus and diphtheria have dropped by about 99% and pertussis by about 80%. 2. Tdap vaccine Tdap vaccine can protect adolescents and adults from tetanus, diphtheria, and pertussis. One dose of Tdap is routinely given at age 15 or 1. People who did not get Tdap at that age should get it as soon as possible. Tdap is especially important for health care professionals and anyone having close contact with a baby younger than 12  months. Pregnant women should get a dose of Tdap during every pregnancy, to protect the newborn from pertussis. Infants are most at risk for severe, life-threatening complications from pertussis. A similar vaccine, called Td, protects from tetanus and diphtheria, but not pertussis. A Td booster should be given every 10 years. Tdap may be given as one of these boosters if you have not already gotten a dose. Tdap may also be given after a severe cut or burn to prevent tetanus infection. Your doctor can give you more information. Tdap may safely be given at the same time as other vaccines. 3. Some people should not get this vaccine  If you ever had a life-threatening allergic reaction after a dose of any tetanus, diphtheria, or pertussis containing vaccine, OR if you have a severe allergy to any part of this vaccine, you should not get Tdap. Tell your doctor if you have any severe allergies.  If you had a coma, or long or multiple seizures within 7 days after a childhood dose of DTP or DTaP, you should not get Tdap, unless a cause other than the vaccine was found. You can still get Td.  Talk to your doctor if you:  have epilepsy or another nervous system problem,  had severe pain or swelling after any vaccine containing diphtheria, tetanus or pertussis,  ever had Guillain-Barr Syndrome (GBS),  aren't feeling well on the day the shot is scheduled. 4. Risks of a vaccine reaction With any medicine, including vaccines, there is a chance of side effects. These are usually mild and go away on their own, but serious reactions are also possible. Brief fainting spells can follow a vaccination, leading to injuries from falling. Sitting or lying down for about 15 minutes can help prevent these. Tell your doctor if you feel dizzy or light-headed, or have vision changes or ringing in the ears. Mild problems following Tdap (Did not interfere with activities)  Pain where the shot was given (about 3 in 4  adolescents or 2 in 3 adults)  Redness or swelling where the shot was given (about 1 person in 5)  Mild fever of at least 100.33F (up to about 1 in 25 adolescents or 1 in 100 adults)  Headache (about 3 or 4 people in 10)  Tiredness (about 1 person in 3 or 4)  Nausea, vomiting, diarrhea, stomach ache (up to 1 in 4 adolescents or 1 in 10 adults)  Chills, body aches, sore joints, rash, swollen glands (uncommon) Moderate problems following Tdap (Interfered with activities, but did not require medical attention)  Pain where the shot was given (about 1 in 5 adolescents or 1 in 100 adults)  Redness or swelling where the shot was given (up to about 1 in 16 adolescents or 1 in 25 adults)  Fever over 102F (about 1 in 100 adolescents or 1 in 250 adults)  Headache (about 3 in 20 adolescents or 1 in 10 adults)  Nausea, vomiting, diarrhea, stomach ache (up to 1 or 3 people in 100)  Swelling of the entire arm where the shot was given (up to about 3 in 100). Severe problems following Tdap (Unable to perform usual activities; required medical attention)  Swelling, severe pain, bleeding and redness in the arm where the shot was given (rare). A severe allergic reaction could occur after any vaccine (estimated less than 1 in a million doses). 5. What if there is a serious reaction? What should I look for?  Look for anything that concerns you, such as signs of a severe allergic reaction, very high fever, or behavior changes. Signs of a severe allergic reaction can include hives, swelling of the face and throat, difficulty breathing, a fast heartbeat, dizziness, and weakness. These would start a few minutes to a few hours after the vaccination. What should I do?  If you think it is a severe allergic reaction or other emergency that can't wait, call 9-1-1 or get the person to the nearest hospital. Otherwise, call your doctor.  Afterward, the reaction should be reported to the "Vaccine Adverse  Event Reporting System" (VAERS). Your doctor might file this report, or you can do it yourself through the VAERS web site at www.vaers.SamedayNews.es, or by calling 845-707-6456. VAERS is only for reporting reactions. They do not give medical advice.  6. The National Vaccine Injury Compensation Program The [employer] Vaccine Injury Compensation Program (VICP) is a federal program that was created to compensate people who may have been injured by certain vaccines. Persons who believe they may have been injured by a vaccine can learn about the program and about filing a claim by calling 4021355590 or visiting the [place] website at GoldCloset.com.ee. 7. How can I learn more?  Ask your doctor.  Call your local or state health department.  Contact the Centers for Disease Control and Prevention (CDC):  Call 862-453-4893 or visit CDC's website at http://hunter.com/. CDC Tdap Vaccine VIS (05/18/11) Document Released: 06/27/2011 Document Revised: 05/12/2013 Document Reviewed: 04/09/2013 [employer] Patient Information 2015 [place], [place]. This information is not intended to replace advice given to you by your health care provider. Make sure you discuss any questions you have with your health care provider.   Pneumococcal Vaccine, Polyvalent suspension for injection What is this medicine? PNEUMOCOCCAL VACCINE, POLYVALENT (NEU mo KOK al vak SEEN, pol ee VEY luhnt) is a vaccine to prevent pneumococcus bacteria infection. These bacteria are a major cause of ear infections, 'Strep throat' infections, and serious pneumonia, meningitis, or blood infections worldwide. These vaccines help the body to produce antibodies (protective substances) that help your body defend against these bacteria. This vaccine is recommended for infants and young children. This vaccine will not treat an infection. This medicine may be used for other purposes; ask your health care provider or pharmacist if you have  questions. COMMON BRAND NAME(S): Prevnar 13 What should I tell my health care provider before I take this medicine? They need to know if you have any of these conditions: -bleeding problems -fever -immune system problems -low platelet count in the blood -seizures -an unusual or allergic reaction to pneumococcal vaccine, diphtheria toxoid, other vaccines, latex, other medicines, foods, dyes, or preservatives -pregnant or trying to get pregnant -breast-feeding How should I use this medicine? This vaccine is for injection into a muscle. It is given by a health care professional. A copy of Vaccine Information Statements will be given before each  vaccination. Read this sheet carefully each time. The sheet may change frequently. Talk to your pediatrician regarding the use of this medicine in children. While this drug may be prescribed for children as young as 77 weeks old for selected conditions, precautions do apply. Overdosage: If you think you have taken too much of this medicine contact a poison control center or emergency room at once. NOTE: This medicine is only for you. Do not share this medicine with others. What if I miss a dose? It is important not to miss your dose. Call your doctor or health care professional if you are unable to keep an appointment. What may interact with this medicine? -medicines for cancer chemotherapy -medicines that suppress your immune function -medicines that treat or prevent blood clots like warfarin, enoxaparin, and dalteparin -steroid medicines like prednisone or cortisone This list may not describe all possible interactions. Give your health care provider a list of all the medicines, herbs, non-prescription drugs, or dietary supplements you use. Also tell them if you smoke, drink alcohol, or use illegal drugs. Some items may interact with your medicine. What should I watch for while using this medicine? Mild fever and pain should go away in 3 days or less.  Report any unusual symptoms to your doctor or health care professional. What side effects may I notice from receiving this medicine? Side effects that you should report to your doctor or health care professional as soon as possible: -allergic reactions like skin rash, itching or hives, swelling of the face, lips, or tongue -breathing problems -confused -fever over 102 degrees F -pain, tingling, numbness in the hands or feet -seizures -unusual bleeding or bruising -unusual muscle weakness Side effects that usually do not require medical attention (report to your doctor or health care professional if they continue or are bothersome): -aches and pains -diarrhea -fever of 102 degrees F or less -headache -irritable -loss of appetite -pain, tender at site where injected -trouble sleeping This list may not describe all possible side effects. Call your doctor for medical advice about side effects. You may report side effects to FDA at 1-800-FDA-1088. Where should I keep my medicine? This does not apply. This vaccine is given in a clinic, pharmacy, doctor's office, or other health care setting and will not be stored at home. NOTE: This sheet is a summary. It may not cover all possible information. If you have questions about this medicine, talk to your doctor, pharmacist, or health care provider.  2015, Elsevier/Gold Standard. (2008-03-10 10:17:22)   DASH Eating Plan DASH stands for "Dietary Approaches to Stop Hypertension." The DASH eating plan is a healthy eating plan that has been shown to reduce high blood pressure (hypertension). Additional health benefits may include reducing the risk of type 2 diabetes mellitus, heart disease, and stroke. The DASH eating plan may also help with weight loss. WHAT DO I NEED TO KNOW ABOUT THE DASH EATING PLAN? For the DASH eating plan, you will follow these general guidelines:  Choose foods with a percent daily value for sodium of less than 5% (as listed  on the food label).  Use salt-free seasonings or herbs instead of table salt or sea salt.  Check with your health care provider or pharmacist before using salt substitutes.  Eat lower-sodium products, often labeled as "lower sodium" or "no salt added."  Eat fresh foods.  Eat more vegetables, fruits, and low-fat dairy products.  Choose whole grains. Look for the word "whole" as the first word in the ingredient list.  Choose fish and skinless chicken or [place] more often than red meat. Limit fish, poultry, and meat to 6 oz (170 g) each day.  Limit sweets, desserts, sugars, and sugary drinks.  Choose heart-healthy fats.  Limit cheese to 1 oz (28 g) per day.  Eat more home-cooked food and less restaurant, buffet, and fast food.  Limit fried foods.  Cook foods using methods other than frying.  Limit canned vegetables. If you do use them, rinse them well to decrease the sodium.  When eating at a restaurant, ask that your food be prepared with less salt, or no salt if possible. WHAT FOODS CAN I EAT? Seek help from a dietitian for individual calorie needs. Grains Whole grain or whole wheat bread. Brown rice. Whole grain or whole wheat pasta. Quinoa, bulgur, and whole grain cereals. Low-sodium cereals. Corn or whole wheat flour tortillas. Whole grain cornbread. Whole grain crackers. Low-sodium crackers. Vegetables Fresh or frozen vegetables (raw, steamed, roasted, or grilled). Low-sodium or reduced-sodium tomato and vegetable juices. Low-sodium or reduced-sodium tomato sauce and paste. Low-sodium or reduced-sodium canned vegetables.  Fruits All fresh, canned (in natural juice), or frozen fruits. Meat and Other Protein Products Ground beef (85% or leaner), grass-fed beef, or beef trimmed of fat. Skinless chicken or [place]. Ground chicken or [place]. Pork trimmed of fat. All fish and seafood. Eggs. Dried beans, peas, or lentils. Unsalted nuts and seeds. Unsalted canned  beans. Dairy Low-fat dairy products, such as skim or 1% milk, 2% or reduced-fat cheeses, low-fat ricotta or cottage cheese, or plain low-fat yogurt. Low-sodium or reduced-sodium cheeses. Fats and Oils Tub margarines without trans fats. Light or reduced-fat mayonnaise and salad dressings (reduced sodium). Avocado. Safflower, olive, or canola oils. Natural peanut or almond butter. Other Unsalted popcorn and pretzels. The items listed above may not be a complete list of recommended foods or beverages. Contact your dietitian for more options. WHAT FOODS ARE NOT RECOMMENDED? Grains White bread. White pasta. White rice. Refined cornbread. Bagels and croissants. Crackers that contain trans fat. Vegetables Creamed or fried vegetables. Vegetables in a cheese sauce. Regular canned vegetables. Regular canned tomato sauce and paste. Regular tomato and vegetable juices. Fruits Dried fruits. Canned fruit in light or heavy syrup. Fruit juice. Meat and Other Protein Products Fatty cuts of meat. Ribs, chicken wings, bacon, sausage, bologna, salami, chitterlings, fatback, hot dogs, bratwurst, and packaged luncheon meats. Salted nuts and seeds. Canned beans with salt. Dairy Whole or 2% milk, cream, half-and-half, and cream cheese. Whole-fat or sweetened yogurt. Full-fat cheeses or blue cheese. Nondairy creamers and whipped toppings. Processed cheese, cheese spreads, or cheese curds. Condiments Onion and garlic salt, seasoned salt, table salt, and sea salt. Canned and packaged gravies. Worcestershire sauce. Tartar sauce. Barbecue sauce. Teriyaki sauce. Soy sauce, including reduced sodium. Steak sauce. Fish sauce. Oyster sauce. Cocktail sauce. Horseradish. Ketchup and mustard. Meat flavorings and tenderizers. Bouillon cubes. Hot sauce. Tabasco sauce. Marinades. Taco seasonings. Relishes. Fats and Oils Butter, stick margarine, lard, shortening, ghee, and bacon fat. Coconut, palm kernel, or palm oils. Regular salad  dressings. Other Pickles and olives. Salted popcorn and pretzels. The items listed above may not be a complete list of foods and beverages to avoid. Contact your dietitian for more information. WHERE CAN I FIND MORE INFORMATION? National Heart, Lung, and Blood Institute: travelstabloid.com Document Released: 12/15/2010 Document Revised: 05/12/2013 Document Reviewed: 10/30/2012 [facility] Patient Information 2015 [place], [place]. This information is not intended to replace advice given to you by your health care provider. Make sure  you discuss any questions you have with your health care provider.

## 2014-04-16 NOTE — Progress Notes (Signed)
Subjective:   [name] is a 81 y.o. female who presents for an Initial Medicare Annual Wellness Visit. [name] lives at home with her husband. She [place] on [place] with many family members. Her daughter, who accompanies her today, lives next door. She helps with her meals and errands.   Review of Systems     Cardiac Risk Factors include: advanced age (>48men, >34 women);obesity (BMI >30kg/m2);sedentary lifestyle   Lengthy discussion with patient about her emotional/mental health. Screening form did indicate some depression. Although she is happy with her 8 children and many grandchildren, she did express some regrets about not accomplishing some personal goals that she had as a young woman. She stated that she married young and had children right away and didn't take time to work on her goals. She has never spoken with a counselor or taken any medications to manage depression. She does not sleep well at night but she does take naps during the day.   She also complains of pain in her hands which limites her ability to use a walker for ambulation. She most likely has a trigger finger in her right 4th digit. She uses compression gloves to help with pain and has taken Mobic and uses Voltaren Gel.   Other systems negative    Objective:    Today's Vitals   04/16/14 1027  BP: 156/97  Pulse: 76  Height: 5' 3.25" (1.607 m)  Weight: 176 lb (79.833 kg)  BMI     30.93  Current Medications (verified) Outpatient Encounter Prescriptions as of 04/10/2014  Medication Sig  . albuterol (PROVENTIL HFA;VENTOLIN HFA) 108 (90 BASE) MCG/ACT inhaler Inhale 2 puffs into the lungs every 6 (six) hours as needed for wheezing or shortness of breath.  . diclofenac sodium (VOLTAREN) 1 % GEL Apply 4 g topically 4 (four) times daily.  . DULoxetine (CYMBALTA) 30 MG capsule TAKE 1 CAPSULE (30 MG TOTAL) BY MOUTH DAILY.  Marland Kitchen gabapentin (NEURONTIN) 100 MG capsule Take 100 mg by mouth. One to three at bedtime  .  iron polysaccharides (FERREX 150) 150 MG capsule Take 1 capsule (150 mg total) by mouth daily.  . meclizine (ANTIVERT) 25 MG tablet TAKE 1 TABLET BY MOUTH TWICE A DAY  . meloxicam (MOBIC) 7.5 MG tablet TAKE 1 TABLET (7.5 MG TOTAL) BY MOUTH DAILY.  Marland Kitchen Psyllium (METAMUCIL PO) Take 1 capsule by mouth daily.  . [DISCONTINUED] budesonide-formoterol (SYMBICORT) 80-4.5 MCG/ACT inhaler Inhale 2 puffs into the lungs 2 (two) times daily.  . [DISCONTINUED] meloxicam (MOBIC) 7.5 MG tablet TAKE 1 TABLET (7.5 MG TOTAL) BY MOUTH DAILY.  . [DISCONTINUED] PROAIR HFA 108 (90 BASE) MCG/ACT inhaler INHALE 2 PUFFS INTO THE LUNGS EVERY 6 (SIX) HOURS AS NEEDED FOR WHEEZING OR SHORTNESS OF BREATH.  . [DISCONTINUED] PROAIR HFA 108 (90 BASE) MCG/ACT inhaler INHALE 2 PUFFS INTO THE LUNGS EVERY 6 (SIX) HOURS AS NEEDED FOR WHEEZING OR SHORTNESS OF BREATH.  . [DISCONTINUED] DULoxetine (CYMBALTA) 30 MG capsule TAKE 1 CAPSULE (30 MG TOTAL) BY MOUTH DAILY.    Allergies (verified) Avelox and Codeine   History (verified): Past Medical History  Diagnosis Date  . Emphysema   . GERD (gastroesophageal reflux disease)   . Depression   . Anxiety   . Anemia   . DJD (degenerative joint disease)   . Diverticulosis 10/2009    Pandiverticulosis, per colonoscopy, Dr. Oneida Alar  . Acute diverticulitis   . AVM (arteriovenous malformation) of colon   . Internal hemorrhoids   .  Rectal bleeding     Secondary to diverticulosis, hemorrhoids, and/or cecal AVMs   Past Surgical History  Procedure Laterality Date  . Right total knee arthroplasty      X2  . Colonoscopy  Oct 2011    Dr. Oneida Alar: pancolonic diverticulosis, 2 cecal AVMs s/p ablation, retained stool in left colon, moderate internal hemorrhoids  . Esophagogastroduodenoscopy Left 08/10/2012    Procedure: ESOPHAGOGASTRODUODENOSCOPY (EGD);  Surgeon: Daneil Dolin, MD;  Location: [facility];  Service: Endoscopy;  Laterality: Left;  EGD for abdominal pain and GIB   Family History    Problem Relation Age of Onset  . Asthma Mother   . Colon cancer Neg Hx   . Cancer Sister     stomach  . Heart attack Brother   . Epilepsy Brother    Social History   Occupational History  . Not on file.   Social History Main Topics  . Smoking status: Former Smoker -- 1.00 packs/day for 65 years    Quit date: 10/26/2006  . Smokeless tobacco: Never Used  . Alcohol Use: No  . Drug Use: No  . Sexual Activity: Not on file    Activities of Daily Living In your present state of health, do you have any difficulty performing the following activities: 04/10/2014  Is the patient deaf or have difficulty hearing? N  Hearing N  Vision N  Difficulty concentrating or making decisions Y  Walking or climbing stairs? N  Doing errands, shopping? Y  Preparing Food and eating ? Y  Using the Toilet? N  In the past six months, have you accidently leaked urine? N  Do you have problems with loss of bowel control? N  Managing your Medications? Y  Managing your Finances? Y  Housekeeping or managing your Housekeeping? N    Immunizations and Health Maintenance Immunization History  Administered Date(s) Administered  . Influenza Split 10/27/2011  . Pneumococcal Conjugate-13 04/10/2014  . Pneumococcal Polysaccharide-23 05/10/2007  . Td 01/10/1996  . Tdap 04/10/2014  . Zoster 04/10/2014    Patient Care Team: Chevis Pretty, FNP as PCP - General (Nurse Practitioner)  Manus Rudd, MD as Consulting Physician-Gastroenterology Gaynelle Arabian, MD as [facility] Roseanne Kaufman, MD as Consulting Physician- Orthopedic Surgery      Assessment:   This is a routine wellness examination for [place].   Hearing/Vision screen No hearing or vision deficits noted at visit today.  Dietary issues and exercise activities discussed:  Increase activity level as tolerated. Chair exercises would be a safe form of exercise since she is a fall risk and has pain in both knees  and hands. Eat a balanced diet that includes lead proteins, fruits, and vegetables.  Goals    . Exercise 3x per week (30 min per time)     Walk with assistive devices as tolerated. Suggested chair exercises.      Depression Screen PHQ 2/9 Scores 04/10/2014 09/09/2013 06/04/2013  PHQ - 2 Score 6 0 0  PHQ- 9 Score 16 - -    Fall Risk Fall Risk  04/10/2014 09/09/2013 06/04/2013  Falls in the past year? Yes Yes No  Number falls in past yr: 1 - -  Injury with Fall? Yes - -  Risk for fall due to : History of fall(s) Impaired balance/gait;Impaired mobility -    Cognitive Function: MMSE - Mini Mental State Exam 04/16/2014 04/10/2014  Orientation to time - 5  Orientation to Place - 5  Registration - 3  Attention/ Calculation -  3  Recall - 3  Language- name 2 objects - 2  Language- repeat - 1  Language- follow 3 step command 3 -  Language- read & follow direction - 1  Write a sentence - 1  Copy design - 1    Screening Tests Health Maintenance  Topic Date Due  . INFLUENZA VACCINE  08/10/2014  . DEXA SCAN  04/09/2016  . COLONOSCOPY  09/10/2019  . TETANUS/TDAP  04/09/2024  . ZOSTAVAX  Completed  . PNA vac Low Risk Adult  Completed      Plan:   -Appointment scheduled with Dr Sabra Heck to address hand pain, trigger finger, HTN, bone density results and depression. -Monitor blood pressure at home  During the course of the visit, Eniyah was educated and counseled about the following appropriate screening and preventive services:   Tdap, prevnar and Zostavax given at today's visit. Flu shot recommended yearly.   Electrocardiogram-Due at next office visit  Colorectal cancer screening-FOBT given today. Last colonoscopy was 2011.  Bone density screening-Done at today's visit  Diabetes screening-Glucose monitored at routine office visits  Glaucoma screening-Schedule eye exam and have notes forwarded to our office.   Nutrition counseling-DASH diet  Patient Instructions (the written plan)  were given to the patient.    Chong Sicilian, RN  04/16/2014      I have reviewed and agree with the above AWV documentation.  Claretta Fraise, M.D.

## 2014-04-22 ENCOUNTER — Other Ambulatory Visit: Payer: Self-pay | Admitting: Nurse Practitioner

## 2014-05-01 ENCOUNTER — Encounter: Payer: Self-pay | Admitting: Family Medicine

## 2014-05-01 ENCOUNTER — Ambulatory Visit (INDEPENDENT_AMBULATORY_CARE_PROVIDER_SITE_OTHER): Payer: Medicare Other | Admitting: Family Medicine

## 2014-05-01 VITALS — BP 162/104 | HR 86 | Temp 97.3°F | Ht 63.25 in | Wt 177.0 lb

## 2014-05-01 DIAGNOSIS — F329 Major depressive disorder, single episode, unspecified: Secondary | ICD-10-CM | POA: Diagnosis not present

## 2014-05-01 DIAGNOSIS — I10 Essential (primary) hypertension: Secondary | ICD-10-CM | POA: Diagnosis not present

## 2014-05-01 DIAGNOSIS — F32A Depression, unspecified: Secondary | ICD-10-CM | POA: Insufficient documentation

## 2014-05-01 MED ORDER — MELOXICAM 7.5 MG PO TABS: 7.5000 mg | ORAL_TABLET | Freq: Every day | ORAL | Status: DC

## 2014-05-01 MED ORDER — AMLODIPINE BESYLATE 5 MG PO TABS: 5.0000 mg | ORAL_TABLET | Freq: Every day | ORAL | Status: DC

## 2014-05-01 NOTE — Patient Instructions (Signed)

## 2014-05-01 NOTE — Progress Notes (Signed)
   Subjective:    Patient ID: [name], female    DOB: 07/25/1928, 81 y.o.   MRN: [id number]  HPI 81 year old female here to have her trigger fingers treated. Also noted to have high blood pressure. Daughters who accompany her to come me that the antidepressant is not working well. There is no element of dementia by history or exam today. She does complain of knee pain. She's had bilateral knee replacements and takes gabapentin as well as meloxicam.  Chief Complaint  Patient presents with  . Depression    Patient was recently seen for a Medicare Annual Wellness visit. She had a high score on her PHQ-9 depression screening.   . Arthritis    both hands and possible trigger finger right 4th digit. Arthritis pain interferes with using her walker.  . Hypertension    Elevated blood pressure at [employer].      Patient Active Problem List   Diagnosis Date Noted  . Hematochezia 08/08/2012  . Abdominal pain, other specified site 08/08/2012  . Tachycardia 08/08/2012  . GI bleed 08/08/2012  . AVM (arteriovenous malformation) of colon   . COPD with exacerbation 10/26/2011  . Emphysema 10/26/2011  . Anemia 10/26/2011  . Dehydration 10/26/2011  . Anxiety 10/26/2011  . GERD (gastroesophageal reflux disease) 10/26/2011  . Acute respiratory failure with hypoxia 10/26/2011  . Diverticulosis 10/09/2009   Outpatient Encounter Prescriptions as of 05/01/2014  Medication Sig  . albuterol (PROVENTIL HFA;VENTOLIN HFA) 108 (90 BASE) MCG/ACT inhaler Inhale 2 puffs into the lungs every 6 (six) hours as needed for wheezing or shortness of breath.  . diclofenac sodium (VOLTAREN) 1 % GEL Apply 4 g topically 4 (four) times daily.  . DULoxetine (CYMBALTA) 30 MG capsule TAKE 1 CAPSULE (30 MG TOTAL) BY MOUTH DAILY.  Marland Kitchen gabapentin (NEURONTIN) 300 MG capsule Take 300 mg by mouth 2 (two) times daily.  . iron polysaccharides (FERREX 150) 150 MG capsule Take 1 capsule (150 mg total) by mouth daily.    . meclizine (ANTIVERT) 25 MG tablet TAKE 1 TABLET BY MOUTH TWICE A DAY  . meloxicam (MOBIC) 7.5 MG tablet TAKE 1 TABLET (7.5 MG TOTAL) BY MOUTH DAILY.  Marland Kitchen Psyllium (METAMUCIL PO) Take 1 capsule by mouth daily.  . [DISCONTINUED] gabapentin (NEURONTIN) 100 MG capsule Take 100 mg by mouth. One to three at bedtime     Review of Systems  Constitutional: Negative.   Respiratory: Negative.   Gastrointestinal: Negative.   Musculoskeletal: Positive for arthralgias.  Neurological: Negative.        Objective:   Physical Exam  Musculoskeletal:  Palpable nodules in both palms appreciated and related to trigger fingers long finger and fourth finger and right and left hand respectively. After prepping the skin with alcohol small amount of Kenalog was injected into each nodule she tolerated this well.    BP 162/104 mmHg  Pulse 86  Temp(Src) 97.3 F (36.3 C) (Oral)  Ht 5' 3.25" (1.607 m)  Wt 177 lb (80.287 kg)  BMI 31.09 kg/m2       Assessment & Plan:  1. Essential hypertension, benign No history of hypertension but pressure has been elevated now on several observations. Will begin amlodipine 5 mg and recheck her in 2-3 weeks.  2. Depression Since family feels that 30 mg of Cymbalta is not helping will try higher dose before switching to different medicine completely. Increase to 60 mg and recheck in 2-3 weeks  Wardell Honour MD

## 2014-05-20 ENCOUNTER — Ambulatory Visit (INDEPENDENT_AMBULATORY_CARE_PROVIDER_SITE_OTHER): Payer: Medicare Other | Admitting: Family Medicine

## 2014-05-20 ENCOUNTER — Other Ambulatory Visit: Payer: Self-pay | Admitting: Nurse Practitioner

## 2014-05-20 ENCOUNTER — Encounter: Payer: Self-pay | Admitting: Family Medicine

## 2014-05-20 VITALS — BP 158/82 | HR 101 | Temp 97.7°F | Ht 63.25 in | Wt 179.0 lb

## 2014-05-20 DIAGNOSIS — I10 Essential (primary) hypertension: Secondary | ICD-10-CM

## 2014-05-20 DIAGNOSIS — M25561 Pain in right knee: Secondary | ICD-10-CM | POA: Diagnosis not present

## 2014-05-20 DIAGNOSIS — F419 Anxiety disorder, unspecified: Secondary | ICD-10-CM

## 2014-05-20 DIAGNOSIS — M25562 Pain in left knee: Secondary | ICD-10-CM

## 2014-05-20 MED ORDER — DULOXETINE HCL 30 MG PO CPEP: 30.0000 mg | ORAL_CAPSULE | Freq: Two times a day (BID) | ORAL | Status: DC

## 2014-05-20 MED ORDER — MELOXICAM 7.5 MG PO TABS: 7.5000 mg | ORAL_TABLET | Freq: Every day | ORAL | Status: DC

## 2014-05-20 MED ORDER — TRAMADOL HCL 50 MG PO TABS: 50.0000 mg | ORAL_TABLET | Freq: Three times a day (TID) | ORAL | Status: DC | PRN

## 2014-05-20 MED ORDER — MECLIZINE HCL 25 MG PO TABS: 25.0000 mg | ORAL_TABLET | Freq: Two times a day (BID) | ORAL | Status: DC

## 2014-05-20 NOTE — Progress Notes (Signed)
Subjective:    Patient ID: [name], female    DOB: 1928/08/16, 81 y.o.   MRN: [id number]  HPI 81 year old female who is here follow-up. I injected some nodules in her hands several weeks ago that were causing trigger fingers and pain. She reports that these are much improved.  Last visit we also started amlodipine pressure was 162/104. Daughter has been monitoring pressure and today it's 158/82 so that's a definite improvement.  Today she is complaining more pain in her right knee. She is status post bilateral total knee replacement. We had tried meloxicam without success. She also takes Cymbalta and gabapentin both of these medicines are really for lower extremity pain and neuropathy.  Patient Active Problem List   Diagnosis Date Noted  . Depression 05/01/2014  . Hematochezia 08/08/2012  . Abdominal pain, other specified site 08/08/2012  . Tachycardia 08/08/2012  . GI bleed 08/08/2012  . AVM (arteriovenous malformation) of colon   . COPD with exacerbation 10/26/2011  . Emphysema 10/26/2011  . Anemia 10/26/2011  . Dehydration 10/26/2011  . Anxiety 10/26/2011  . GERD (gastroesophageal reflux disease) 10/26/2011  . Acute respiratory failure with hypoxia 10/26/2011  . Diverticulosis 10/09/2009   Outpatient Encounter Prescriptions as of 05/20/2014  Medication Sig  . albuterol (PROVENTIL HFA;VENTOLIN HFA) 108 (90 BASE) MCG/ACT inhaler Inhale 2 puffs into the lungs every 6 (six) hours as needed for wheezing or shortness of breath.  Marland Kitchen amLODipine (NORVASC) 5 MG tablet Take 1 tablet (5 mg total) by mouth daily.  . diclofenac sodium (VOLTAREN) 1 % GEL Apply 4 g topically 4 (four) times daily.  . DULoxetine (CYMBALTA) 30 MG capsule TAKE 1 CAPSULE (30 MG TOTAL) BY MOUTH DAILY.  . DULoxetine (CYMBALTA) 30 MG capsule Take 1 capsule (30 mg total) by mouth 2 (two) times daily.  Marland Kitchen gabapentin (NEURONTIN) 300 MG capsule Take 300 mg by mouth 2 (two) times daily.  . iron polysaccharides  (FERREX 150) 150 MG capsule Take 1 capsule (150 mg total) by mouth daily.  . meclizine (ANTIVERT) 25 MG tablet Take 1 tablet (25 mg total) by mouth 2 (two) times daily.  . meloxicam (MOBIC) 7.5 MG tablet Take 1 tablet (7.5 mg total) by mouth daily.  . Psyllium (METAMUCIL PO) Take 1 capsule by mouth daily.  . [DISCONTINUED] DULoxetine (CYMBALTA) 30 MG capsule TAKE 1 CAPSULE (30 MG TOTAL) BY MOUTH DAILY.  . [DISCONTINUED] meclizine (ANTIVERT) 25 MG tablet TAKE 1 TABLET BY MOUTH TWICE A DAY  . [DISCONTINUED] meloxicam (MOBIC) 7.5 MG tablet Take 1 tablet (7.5 mg total) by mouth daily.   No facility-administered encounter medications on file as of 05/20/2014.      Review of Systems  Constitutional: Negative.   HENT: Negative.   Respiratory: Negative.   Cardiovascular: Negative.   Musculoskeletal: Positive for arthralgias.  Neurological: Negative.   Psychiatric/Behavioral: Negative.        Objective:   Physical Exam  Constitutional: She is oriented to person, place, and time. She appears well-developed and well-nourished.  Cardiovascular: Normal rate and regular rhythm.   Pulmonary/Chest: Effort normal and breath sounds normal.  Musculoskeletal: Normal range of motion.  Neurological: She is alert and oriented to person, place, and time.    BP 158/82 mmHg  Pulse 101  Temp(Src) 97.7 F (36.5 C) (Oral)  Ht 5' 3.25" (1.607 m)  Wt 179 lb (81.194 kg)  BMI 31.44 kg/m2       Assessment & Plan:  1. Anxiety The symptoms are well managed  2. Essential hypertension Blood pressure is better on amlodipine. Will plan to continue  3. Knee pain, bilateral Will start up the analgesic latter with tramadol. I suspect she will need more raised the possibility that oxycodone or fentanyl may be needed eventually  Wardell Honour MD

## 2014-06-01 ENCOUNTER — Telehealth: Payer: Self-pay | Admitting: Family Medicine

## 2014-06-02 ENCOUNTER — Telehealth: Payer: Self-pay | Admitting: *Deleted

## 2014-06-02 MED ORDER — HYDROCODONE-ACETAMINOPHEN 5-325 MG PO TABS: 1.0000 | ORAL_TABLET | Freq: Two times a day (BID) | ORAL | Status: DC | PRN

## 2014-06-02 NOTE — Telephone Encounter (Signed)
When I do chart review time not showing that we prescribed tramadol. Need more details

## 2014-06-02 NOTE — Telephone Encounter (Signed)
This was documented on a duplicate chart.

## 2014-06-02 NOTE — Telephone Encounter (Signed)
Tramadol prescribed for bilateral knee pain but it is causing headaches. Can you prescribe something else?

## 2014-06-02 NOTE — Telephone Encounter (Signed)
Okay to try hydrocodone 5 mg twice a day as needed for 6

## 2014-06-02 NOTE — Telephone Encounter (Signed)
Script printed and placed up front for pickup. Daughter notified. Made her aware that there is the potential for a headache to develop with this medication as well and to call back if she has any problems.

## 2014-06-28 ENCOUNTER — Other Ambulatory Visit: Payer: Self-pay | Admitting: Family Medicine

## 2014-06-28 ENCOUNTER — Other Ambulatory Visit: Payer: Self-pay | Admitting: Nurse Practitioner

## 2014-07-16 ENCOUNTER — Other Ambulatory Visit: Payer: Self-pay | Admitting: Nurse Practitioner

## 2014-07-20 ENCOUNTER — Other Ambulatory Visit: Payer: Self-pay | Admitting: Nurse Practitioner

## 2014-07-24 ENCOUNTER — Telehealth: Payer: Self-pay | Admitting: Family Medicine

## 2014-07-24 NOTE — Telephone Encounter (Signed)
Last filled 06/02/14, last seen 05/20/14. Rx will print

## 2014-07-27 MED ORDER — HYDROCODONE-ACETAMINOPHEN 5-325 MG PO TABS: 1.0000 | ORAL_TABLET | Freq: Two times a day (BID) | ORAL | Status: DC | PRN

## 2014-09-16 ENCOUNTER — Ambulatory Visit (INDEPENDENT_AMBULATORY_CARE_PROVIDER_SITE_OTHER): Payer: Medicare Other | Admitting: Family Medicine

## 2014-09-16 ENCOUNTER — Encounter: Payer: Self-pay | Admitting: Family Medicine

## 2014-09-16 VITALS — BP 145/86 | HR 84 | Temp 97.4°F | Ht 63.25 in | Wt 183.0 lb

## 2014-09-16 DIAGNOSIS — M25562 Pain in left knee: Secondary | ICD-10-CM | POA: Diagnosis not present

## 2014-09-16 DIAGNOSIS — M25561 Pain in right knee: Secondary | ICD-10-CM

## 2014-09-16 DIAGNOSIS — J439 Emphysema, unspecified: Secondary | ICD-10-CM | POA: Diagnosis not present

## 2014-09-16 DIAGNOSIS — I10 Essential (primary) hypertension: Secondary | ICD-10-CM

## 2014-09-16 DIAGNOSIS — F32A Depression, unspecified: Secondary | ICD-10-CM

## 2014-09-16 DIAGNOSIS — F329 Major depressive disorder, single episode, unspecified: Secondary | ICD-10-CM | POA: Diagnosis not present

## 2014-09-16 MED ORDER — CITALOPRAM HYDROBROMIDE 20 MG PO TABS: 20.0000 mg | ORAL_TABLET | Freq: Every day | ORAL | Status: DC

## 2014-09-16 NOTE — Progress Notes (Signed)
Subjective:    Patient ID: ENGLISH TOMER, female    DOB: 28-May-1928, 81 y.o.   MRN: [id number]  HPI  81 year old female who is here to follow-up blood pressure chronic back and knee pain , COPD. Her blood pressures have been good with the addition of amlodipine and it appears to be well tolerated. She complains of pain in her back down her legs as well as in her knees. She has had total right knee replacement twice. She uses gabapentin and hydrocodone for the pain. She also takes meclizine for dizziness. Combination of these medicines cause some daytime drowsiness. Her daughter who accompanies her today regulates her medicines.   she has been on Cymbalta for depression as well as chronic pain but it does not appear to be at effective.   Patient Active Problem List   Diagnosis Date Noted  . Essential hypertension 05/20/2014  . Knee pain, bilateral 05/20/2014  . Depression 05/01/2014  . Hematochezia 08/08/2012  . Abdominal pain, other specified site 08/08/2012  . Tachycardia 08/08/2012  . GI bleed 08/08/2012  . AVM (arteriovenous malformation) of colon   . COPD with exacerbation 10/26/2011  . Emphysema 10/26/2011  . Anemia 10/26/2011  . Dehydration 10/26/2011  . Anxiety 10/26/2011  . GERD (gastroesophageal reflux disease) 10/26/2011  . Acute respiratory failure with hypoxia 10/26/2011  . Diverticulosis 10/09/2009   Outpatient Encounter Prescriptions as of 09/16/2014  Medication Sig  . amLODipine (NORVASC) 5 MG tablet TAKE 1 TABLET (5 MG TOTAL) BY MOUTH DAILY.  . DULoxetine (CYMBALTA) 30 MG capsule TAKE 1 CAPSULE (30 MG TOTAL) BY MOUTH DAILY.  Marland Kitchen FERREX 150 150 MG capsule TAKE 1 CAPSULE (150 MG TOTAL) BY MOUTH DAILY.  Marland Kitchen gabapentin (NEURONTIN) 300 MG capsule Take 300 mg by mouth 2 (two) times daily.  Marland Kitchen HYDROcodone-acetaminophen (NORCO/VICODIN) 5-325 MG per tablet Take 1 tablet by mouth 2 (two) times daily as needed for moderate pain.  . meclizine (ANTIVERT) 25 MG tablet Take 1 tablet  (25 mg total) by mouth 2 (two) times daily.  . meloxicam (MOBIC) 7.5 MG tablet Take 1 tablet (7.5 mg total) by mouth daily.  Marland Kitchen PROAIR HFA 108 (90 BASE) MCG/ACT inhaler INHALE 2 PUFFS INTO THE LUNGS EVERY 6 (SIX) HOURS AS NEEDED FOR WHEEZING OR SHORTNESS OF BREATH.  Marland Kitchen Psyllium (METAMUCIL PO) Take 1 capsule by mouth daily.  . [DISCONTINUED] albuterol (PROVENTIL HFA;VENTOLIN HFA) 108 (90 BASE) MCG/ACT inhaler Inhale 2 puffs into the lungs every 6 (six) hours as needed for wheezing or shortness of breath.  . [DISCONTINUED] DULoxetine (CYMBALTA) 30 MG capsule Take 1 capsule (30 mg total) by mouth 2 (two) times daily.  . [DISCONTINUED] DULoxetine (CYMBALTA) 30 MG capsule TAKE 1 CAPSULE (30 MG TOTAL) BY MOUTH DAILY.  Marland Kitchen diclofenac sodium (VOLTAREN) 1 % GEL Apply 4 g topically 4 (four) times daily. (Patient not taking: Reported on 09/16/2014)  . [DISCONTINUED] traMADol (ULTRAM) 50 MG tablet Take 1 tablet (50 mg total) by mouth every 8 (eight) hours as needed.   No facility-administered encounter medications on file as of 09/16/2014.     Review of Systems  Constitutional: Negative.   Respiratory: Negative.   Cardiovascular: Negative.   Gastrointestinal: Negative.   Genitourinary: Negative.   Musculoskeletal: Positive for back pain and arthralgias.  Neurological: Positive for dizziness.       Objective:   Physical Exam  Constitutional: She is oriented to person, place, and time. She appears well-developed and well-nourished.  Cardiovascular: Normal rate and regular rhythm.  Pulmonary/Chest: Effort normal and breath sounds normal.  Abdominal: Soft. Bowel sounds are normal.  Musculoskeletal: Normal range of motion.  Neurological: She is alert and oriented to person, place, and time.  Psychiatric: She has a normal mood and affect. Her behavior is normal.    BP 145/86 mmHg  Pulse 84  Temp(Src) 97.4 F (36.3 C) (Oral)  Ht 5' 3.25" (1.607 m)  Wt 183 lb (83.008 kg)  BMI 32.14 kg/m2         Assessment & Plan:  1. Essential hypertension Blood pressure is well controlled on amlodipine. Will continue same  2. Pulmonary emphysema, unspecified emphysema type . She does have albuterol inhaler that she uses as neededNo complaints with breathing  3. Depression Symptoms persist. I do not think this is part of any dementia. Will taper Cymbalta and begin Celexa 20 mg.  4. Knee pain, bilateral Knee pain is related to degenerative changes continue to use gabapentin and hydrocodone as needed for pain  Wardell Honour MD

## 2014-09-17 ENCOUNTER — Other Ambulatory Visit: Payer: Self-pay | Admitting: Family Medicine

## 2014-09-17 ENCOUNTER — Other Ambulatory Visit: Payer: Self-pay | Admitting: Nurse Practitioner

## 2014-09-17 NOTE — Telephone Encounter (Signed)
Last filled 07/27/14, last seen 09/16/14. [place] patient, will print

## 2014-09-22 ENCOUNTER — Other Ambulatory Visit: Payer: Self-pay | Admitting: Family Medicine

## 2014-09-25 MED ORDER — HYDROCODONE-ACETAMINOPHEN 5-325 MG PO TABS: 1.0000 | ORAL_TABLET | Freq: Two times a day (BID) | ORAL | Status: DC | PRN

## 2014-09-25 NOTE — Telephone Encounter (Signed)
Up front for pick up 

## 2014-09-29 MED ORDER — HYDROCODONE-ACETAMINOPHEN 5-325 MG PO TABS: 1.0000 | ORAL_TABLET | Freq: Two times a day (BID) | ORAL | Status: DC | PRN

## 2014-09-29 NOTE — Telephone Encounter (Signed)
Patient aware that rx is ready to be picked up.

## 2014-09-29 NOTE — Telephone Encounter (Signed)
Per pt request

## 2014-10-23 ENCOUNTER — Other Ambulatory Visit: Payer: Self-pay | Admitting: Family Medicine

## 2014-10-27 MED ORDER — HYDROCODONE-ACETAMINOPHEN 5-325 MG PO TABS: 1.0000 | ORAL_TABLET | Freq: Two times a day (BID) | ORAL | Status: DC | PRN

## 2014-10-27 NOTE — Telephone Encounter (Signed)
Patient aware that Rx is ready for pick up

## 2014-10-27 NOTE — Telephone Encounter (Signed)
Rx refill per patient request

## 2014-11-06 ENCOUNTER — Ambulatory Visit: Payer: Medicare Other | Admitting: *Deleted

## 2014-11-06 VITALS — BP 137/81 | HR 83

## 2014-11-06 DIAGNOSIS — I10 Essential (primary) hypertension: Secondary | ICD-10-CM

## 2014-11-06 NOTE — Progress Notes (Signed)
Pt here for BP check

## 2014-11-12 ENCOUNTER — Other Ambulatory Visit: Payer: Self-pay | Admitting: Family Medicine

## 2014-11-13 ENCOUNTER — Other Ambulatory Visit: Payer: Self-pay | Admitting: Family Medicine

## 2014-11-15 ENCOUNTER — Emergency Department (HOSPITAL_COMMUNITY): Payer: Medicare Other

## 2014-11-15 ENCOUNTER — Emergency Department (HOSPITAL_COMMUNITY)
Admission: EM | Admit: 2014-11-15 | Discharge: 2014-11-15 | Disposition: A | Discharge status: Home / Self Care | Payer: Medicare Other | Attending: Emergency Medicine | Admitting: Emergency Medicine

## 2014-11-15 ENCOUNTER — Encounter (HOSPITAL_COMMUNITY): Payer: Self-pay | Admitting: Emergency Medicine

## 2014-11-15 DIAGNOSIS — R1084 Generalized abdominal pain: Secondary | ICD-10-CM | POA: Insufficient documentation

## 2014-11-15 DIAGNOSIS — M791 Myalgia, unspecified site: Secondary | ICD-10-CM

## 2014-11-15 DIAGNOSIS — R1011 Right upper quadrant pain: Secondary | ICD-10-CM | POA: Diagnosis not present

## 2014-11-15 DIAGNOSIS — Z791 Long term (current) use of non-steroidal anti-inflammatories (NSAID): Secondary | ICD-10-CM | POA: Diagnosis not present

## 2014-11-15 DIAGNOSIS — R079 Chest pain, unspecified: Secondary | ICD-10-CM | POA: Insufficient documentation

## 2014-11-15 DIAGNOSIS — Z79899 Other long term (current) drug therapy: Secondary | ICD-10-CM | POA: Diagnosis not present

## 2014-11-15 DIAGNOSIS — D649 Anemia, unspecified: Secondary | ICD-10-CM | POA: Insufficient documentation

## 2014-11-15 DIAGNOSIS — R51 Headache: Secondary | ICD-10-CM | POA: Diagnosis not present

## 2014-11-15 DIAGNOSIS — F329 Major depressive disorder, single episode, unspecified: Secondary | ICD-10-CM | POA: Insufficient documentation

## 2014-11-15 DIAGNOSIS — F419 Anxiety disorder, unspecified: Secondary | ICD-10-CM | POA: Diagnosis not present

## 2014-11-15 DIAGNOSIS — Q2733 Arteriovenous malformation of digestive system vessel: Secondary | ICD-10-CM | POA: Diagnosis not present

## 2014-11-15 DIAGNOSIS — M549 Dorsalgia, unspecified: Secondary | ICD-10-CM | POA: Diagnosis not present

## 2014-11-15 DIAGNOSIS — Z8709 Personal history of other diseases of the respiratory system: Secondary | ICD-10-CM | POA: Diagnosis not present

## 2014-11-15 DIAGNOSIS — R0602 Shortness of breath: Secondary | ICD-10-CM | POA: Insufficient documentation

## 2014-11-15 DIAGNOSIS — R05 Cough: Secondary | ICD-10-CM | POA: Insufficient documentation

## 2014-11-15 DIAGNOSIS — M542 Cervicalgia: Secondary | ICD-10-CM | POA: Insufficient documentation

## 2014-11-15 DIAGNOSIS — I10 Essential (primary) hypertension: Secondary | ICD-10-CM | POA: Insufficient documentation

## 2014-11-15 DIAGNOSIS — Z8719 Personal history of other diseases of the digestive system: Secondary | ICD-10-CM | POA: Diagnosis not present

## 2014-11-15 DIAGNOSIS — Z87891 Personal history of nicotine dependence: Secondary | ICD-10-CM | POA: Diagnosis not present

## 2014-11-15 HISTORY — DX: Essential (primary) hypertension: I10

## 2014-11-15 LAB — CBC WITH DIFFERENTIAL/PLATELET
BASOS ABS: 0 10*3/uL (ref 0.0–0.1)
BASOS PCT: 1 %
EOS ABS: 0.2 10*3/uL (ref 0.0–0.7)
Eosinophils Relative: 3 %
HCT: 47.1 % — ABNORMAL HIGH (ref 36.0–46.0)
HEMOGLOBIN: 15.3 g/dL — AB (ref 12.0–15.0)
Lymphocytes Relative: 25 %
Lymphs Abs: 1.4 10*3/uL (ref 0.7–4.0)
MCH: 30 pg (ref 26.0–34.0)
MCHC: 32.5 g/dL (ref 30.0–36.0)
MCV: 92.4 fL (ref 78.0–100.0)
MONOS PCT: 12 %
Monocytes Absolute: 0.7 10*3/uL (ref 0.1–1.0)
NEUTROS PCT: 59 %
Neutro Abs: 3.5 10*3/uL (ref 1.7–7.7)
Platelets: 370 10*3/uL (ref 150–400)
RBC: 5.1 MIL/uL (ref 3.87–5.11)
RDW: 13.3 % (ref 11.5–15.5)
WBC: 5.8 10*3/uL (ref 4.0–10.5)

## 2014-11-15 LAB — COMPREHENSIVE METABOLIC PANEL
ALT: 24 U/L (ref 14–54)
ANION GAP: 10 (ref 5–15)
AST: 30 U/L (ref 15–41)
Albumin: 4.2 g/dL (ref 3.5–5.0)
Alkaline Phosphatase: 88 U/L (ref 38–126)
BUN: 12 mg/dL (ref 6–20)
CHLORIDE: 104 mmol/L (ref 101–111)
CO2: 28 mmol/L (ref 22–32)
CREATININE: 0.84 mg/dL (ref 0.44–1.00)
Calcium: 9.8 mg/dL (ref 8.9–10.3)
Glucose, Bld: 86 mg/dL (ref 65–99)
Potassium: 4 mmol/L (ref 3.5–5.1)
SODIUM: 142 mmol/L (ref 135–145)
Total Bilirubin: 0.7 mg/dL (ref 0.3–1.2)
Total Protein: 7.7 g/dL (ref 6.5–8.1)

## 2014-11-15 LAB — URINE MICROSCOPIC-ADD ON

## 2014-11-15 LAB — URINALYSIS, ROUTINE W REFLEX MICROSCOPIC
Bilirubin Urine: NEGATIVE
GLUCOSE, UA: NEGATIVE mg/dL
KETONES UR: NEGATIVE mg/dL
Nitrite: NEGATIVE
PROTEIN: NEGATIVE mg/dL
Specific Gravity, Urine: <1.005 — ABNORMAL LOW (ref 1.005–1.030)
Urobilinogen, UA: 0.2 mg/dL (ref 0.0–1.0)
pH: 6.5 (ref 5.0–8.0)

## 2014-11-15 LAB — LIPASE, BLOOD: LIPASE: 33 U/L (ref 11–51)

## 2014-11-15 LAB — TROPONIN I

## 2014-11-15 MED ORDER — SODIUM CHLORIDE 0.9 % IV SOLN: INTRAVENOUS | Status: DC

## 2014-11-15 MED ORDER — IOHEXOL 300 MG/ML  SOLN
25.0000 mL | Freq: Once | INTRAMUSCULAR | Status: AC | PRN
Start: 2014-11-15 — End: 2014-11-15
  Administered 2014-11-15: 25 mL via ORAL

## 2014-11-15 MED ORDER — IOHEXOL 300 MG/ML  SOLN
100.0000 mL | Freq: Once | INTRAMUSCULAR | Status: AC | PRN
  Administered 2014-11-15: 100 mL via INTRAVENOUS

## 2014-11-15 MED ORDER — SODIUM CHLORIDE 0.9 % IV BOLUS (SEPSIS)
250.0000 mL | Freq: Once | INTRAVENOUS | Status: AC
  Administered 2014-11-15: 250 mL via INTRAVENOUS

## 2014-11-15 MED ORDER — FENTANYL CITRATE (PF) 100 MCG/2ML IJ SOLN
25.0000 ug | Freq: Once | INTRAMUSCULAR | Status: AC
  Administered 2014-11-15: 25 ug via INTRAVENOUS
  Filled 2014-11-15: qty 2

## 2014-11-15 MED ORDER — ONDANSETRON HCL 4 MG/2ML IJ SOLN
4.0000 mg | Freq: Once | INTRAMUSCULAR | Status: AC
  Administered 2014-11-15: 4 mg via INTRAVENOUS
  Filled 2014-11-15: qty 2

## 2014-11-15 NOTE — ED Notes (Signed)
Pt c/o intermittent sob x 1 week.

## 2014-11-15 NOTE — ED Notes (Signed)
MD at bedside. 

## 2014-11-15 NOTE — ED Provider Notes (Signed)
CSN: [id number]     Arrival date & time 11/15/14  1129 History  By signing my name below, I, [name], attest that this documentation has been prepared under the direction and in the presence of Fredia Sorrow, MD. Electronically Signed: [name], ED Scribe. 11/15/2014. 12:08 PM.    Chief Complaint  Patient presents with  . Shortness of Breath      Patient is a 81 y.o. female presenting with shortness of breath. The history is provided by the patient. No language interpreter was used.  Shortness of Breath Severity:  Severe Onset quality:  Sudden Duration:  4 hours Timing:  Intermittent Chronicity:  New Context: activity   Associated symptoms: abdominal pain, chest pain, cough, headaches and neck pain   Associated symptoms: no fever, no rash, no sore throat and no vomiting     HPI Comments: [name] is a 81 y.o. female with a hx of osteoarthritis who presents to the Emergency Department complaining of intermittent, worsening right-sided and upper abdominal pain that has been present for about a month. Pt also complains of having pain all over the right side of her body, including her neck, back, arm, abdomen, and leg that has been present for two weeks. Her daughter states the main reason she is in the ED is because her back pain and abdominal pain this morning had her in tears because the pain was so severe. Pt rates her back pain and abdominal pain as 9/10 in severity. Her daughter states that she has Emphysema and had severe SOB and wheezing this morning. Her daughter states that she used Symbicort this morning, and it did not help whatsoever.  Pt denies nausea, diarrhea, and vomiting.    Past Medical History  Diagnosis Date  . Emphysema   . GERD (gastroesophageal reflux disease)   . Depression   . Anxiety   . Anemia   . DJD (degenerative joint disease)   . Diverticulosis 10/2009    Pandiverticulosis, per colonoscopy, Dr. Oneida Alar  . Acute diverticulitis   .  AVM (arteriovenous malformation) of colon   . Internal hemorrhoids   . Rectal bleeding     Secondary to diverticulosis, hemorrhoids, and/or cecal AVMs  . Hypertension    Past Surgical History  Procedure Laterality Date  . Right total knee arthroplasty      X2  . Colonoscopy  Oct 2011    Dr. Oneida Alar: pancolonic diverticulosis, 2 cecal AVMs s/p ablation, retained stool in left colon, moderate internal hemorrhoids  . Esophagogastroduodenoscopy Left 08/10/2012    Procedure: ESOPHAGOGASTRODUODENOSCOPY (EGD);  Surgeon: Daneil Dolin, MD;  Location: [facility];  Service: Endoscopy;  Laterality: Left;  EGD for abdominal pain and GIB   Family History  Problem Relation Age of Onset  . Asthma Mother   . Colon cancer Neg Hx   . Cancer Sister     stomach  . Heart attack Brother   . Epilepsy Brother    Social History  Substance Use Topics  . Smoking status: Former Smoker -- 1.00 packs/day for 65 years    Quit date: 10/26/2006  . Smokeless tobacco: Never Used  . Alcohol Use: No   OB History    No data available     Review of Systems  Constitutional: Negative for fever and chills.  HENT: Negative for rhinorrhea and sore throat.   Eyes: Negative for photophobia and visual disturbance.  Respiratory: Positive for cough and shortness of breath.   Cardiovascular: Positive for chest pain.  Gastrointestinal: Positive for abdominal pain. Negative for nausea, vomiting and diarrhea.  Genitourinary: Negative for dysuria and hematuria.  Musculoskeletal: Positive for back pain, arthralgias and neck pain. Negative for joint swelling.  Skin: Negative for rash.  Allergic/Immunologic: Negative for environmental allergies and food allergies.  Neurological: Positive for headaches.  All other systems reviewed and are negative.     Allergies  Avelox and Codeine  Home Medications   Prior to Admission medications   Medication Sig Start Date End Date Taking? Authorizing Provider  amLODipine  (NORVASC) 5 MG tablet TAKE 1 TABLET (5 MG TOTAL) BY MOUTH DAILY. 06/29/14  Yes Wardell Honour, MD  citalopram (CELEXA) 20 MG tablet Take 1 tablet (20 mg total) by mouth daily. 09/16/14  Yes Wardell Honour, MD  DULoxetine (CYMBALTA) 30 MG capsule TAKE 1 CAPSULE (30 MG TOTAL) BY MOUTH DAILY. 09/09/13  Yes Mary-Margaret Hassell Done, FNP  FERREX 150 150 MG capsule TAKE 1 CAPSULE (150 MG TOTAL) BY MOUTH DAILY. 07/16/14  Yes Wardell Honour, MD  gabapentin (NEURONTIN) 300 MG capsule Take 300 mg by mouth 2 (two) times daily. 04/22/14  Yes Historical Provider, MD  HYDROcodone-acetaminophen (NORCO/VICODIN) 5-325 MG tablet Take 1 tablet by mouth 2 (two) times daily as needed for moderate pain. 10/27/14  Yes Wardell Honour, MD  meclizine (ANTIVERT) 25 MG tablet TAKE 1 TABLET (25 MG TOTAL) BY MOUTH 2 (TWO) TIMES DAILY. 09/17/14  Yes Wardell Honour, MD  meloxicam (MOBIC) 7.5 MG tablet TAKE 1 TABLET (7.5 MG TOTAL) BY MOUTH DAILY. 11/12/14  Yes Mary-Margaret Hassell Done, FNP  PROAIR HFA 108 (90 BASE) MCG/ACT inhaler INHALE 2 PUFFS INTO THE LUNGS EVERY 6 (SIX) HOURS AS NEEDED FOR WHEEZING OR SHORTNESS OF BREATH. 06/29/14  Yes Wardell Honour, MD  Psyllium (METAMUCIL PO) Take 1 capsule by mouth daily.   Yes Historical Provider, MD   BP 132/111 mmHg  Pulse 74  Temp(Src) 97.7 F (36.5 C) (Oral)  Resp 23  Ht '5\' 3"'$  (1.6 m)  Wt 175 lb (79.379 kg)  BMI 31.01 kg/m2  SpO2 99% Physical Exam  Constitutional: She is oriented to person, place, and time. She appears well-developed and well-nourished.  HENT:  Head: Normocephalic and atraumatic.  Moist mucous membranes  Eyes: Pupils are equal, round, and reactive to light. No scleral icterus.  Cardiovascular: Normal rate and regular rhythm.   Pulmonary/Chest: Effort normal and breath sounds normal. No respiratory distress. She has no wheezes. She has no rales.  Lungs clear bilaterally  Abdominal: Bowel sounds are normal. She exhibits no distension. There is no guarding.   Right-sided abdominal pain  Musculoskeletal: She exhibits no edema.  No pitting edema  Neurological: She is alert and oriented to person, place, and time.  Skin: Skin is warm and dry.  Psychiatric: She has a normal mood and affect.  Nursing note and vitals reviewed.   ED Course  Procedures (including critical care time) DIAGNOSTIC STUDIES: Oxygen Saturation is 100% on room air, normal by my interpretation.    COORDINATION OF CARE: 12:07 PM Discussed treatment plan with pt at bedside and pt agreed to plan.    Labs Review Labs Reviewed  CBC WITH DIFFERENTIAL/PLATELET - Abnormal; Notable for the following:    Hemoglobin 15.3 (*)    HCT 47.1 (*)    All other components within normal limits  URINALYSIS, ROUTINE W REFLEX MICROSCOPIC (NOT AT [facility]) - Abnormal; Notable for the following:    Specific Gravity, Urine <1.005 (*)    Hgb urine  dipstick TRACE (*)    Leukocytes, UA TRACE (*)    All other components within normal limits  COMPREHENSIVE METABOLIC PANEL  LIPASE, BLOOD  TROPONIN I  URINE MICROSCOPIC-ADD ON   Results for orders placed or performed during the hospital encounter of 11/15/14  Comprehensive metabolic panel  Result Value Ref Range   Sodium 142 135 - 145 mmol/L   Potassium 4.0 3.5 - 5.1 mmol/L   Chloride 104 101 - 111 mmol/L   CO2 28 22 - 32 mmol/L   Glucose, Bld 86 65 - 99 mg/dL   BUN 12 6 - 20 mg/dL   Creatinine, Ser 0.84 0.44 - 1.00 mg/dL   Calcium 9.8 8.9 - 10.3 mg/dL   Total Protein 7.7 6.5 - 8.1 g/dL   Albumin 4.2 3.5 - 5.0 g/dL   AST 30 15 - 41 U/L   ALT 24 14 - 54 U/L   Alkaline Phosphatase 88 38 - 126 U/L   Total Bilirubin 0.7 0.3 - 1.2 mg/dL   GFR calc non Af Amer >60 >60 mL/min   GFR calc Af Amer >60 >60 mL/min   Anion gap 10 5 - 15  Lipase, blood  Result Value Ref Range   Lipase 33 11 - 51 U/L  CBC with Differential/Platelet  Result Value Ref Range   WBC 5.8 4.0 - 10.5 K/uL   RBC 5.10 3.87 - 5.11 MIL/uL   Hemoglobin 15.3 (H) 12.0 - 15.0  g/dL   HCT 47.1 (H) 36.0 - 46.0 %   MCV 92.4 78.0 - 100.0 fL   MCH 30.0 26.0 - 34.0 pg   MCHC 32.5 30.0 - 36.0 g/dL   RDW 13.3 11.5 - 15.5 %   Platelets 370 150 - 400 K/uL   Neutrophils Relative % 59 %   Neutro Abs 3.5 1.7 - 7.7 K/uL   Lymphocytes Relative 25 %   Lymphs Abs 1.4 0.7 - 4.0 K/uL   Monocytes Relative 12 %   Monocytes Absolute 0.7 0.1 - 1.0 K/uL   Eosinophils Relative 3 %   Eosinophils Absolute 0.2 0.0 - 0.7 K/uL   Basophils Relative 1 %   Basophils Absolute 0.0 0.0 - 0.1 K/uL  Troponin I  Result Value Ref Range   Troponin I <0.03 <0.031 ng/mL  Urinalysis, Routine w reflex microscopic (not at [facility])  Result Value Ref Range   Color, Urine YELLOW YELLOW   APPearance CLEAR CLEAR   Specific Gravity, Urine <1.005 (L) 1.005 - 1.030   pH 6.5 5.0 - 8.0   Glucose, UA NEGATIVE NEGATIVE mg/dL   Hgb urine dipstick TRACE (A) NEGATIVE   Bilirubin Urine NEGATIVE NEGATIVE   Ketones, ur NEGATIVE NEGATIVE mg/dL   Protein, ur NEGATIVE NEGATIVE mg/dL   Urobilinogen, UA 0.2 0.0 - 1.0 mg/dL   Nitrite NEGATIVE NEGATIVE   Leukocytes, UA TRACE (A) NEGATIVE  Urine microscopic-add on  Result Value Ref Range   Squamous Epithelial / LPF RARE RARE   WBC, UA 0-2 <3 WBC/hpf   RBC / HPF 0-2 <3 RBC/hpf     Imaging Review Dg Chest 2 View  11/15/2014  CLINICAL DATA:  Chest pain and abdominal pain. Intermittent shortness of breath. History of emphysema. EXAM: CHEST  2 VIEW COMPARISON:  05/14/2012 FINDINGS: Heart size and pulmonary vascularity are normal. The lungs are hyperinflated but clear. Tortuosity and calcification of the thoracic aorta. Diffuse osteopenia. IMPRESSION: No acute abnormality. Hyperinflated lungs consistent with emphysema. Aortic atherosclerosis. Electronically Signed   By: Lorriane Shire M.D.  On: 11/15/2014 14:00   Ct Head Wo Contrast  11/15/2014  CLINICAL DATA:  Headache. EXAM: CT HEAD WITHOUT CONTRAST TECHNIQUE: Contiguous axial images were obtained from the base of the  skull through the vertex without intravenous contrast. COMPARISON:  None. FINDINGS: Diffusely enlarged ventricles and subarachnoid spaces. No intracranial hemorrhage, mass lesion or CT evidence of acute infarction. Unremarkable bones and included paranasal sinuses. IMPRESSION: Interval mild diffuse cerebral and cerebellar atrophy. Otherwise, normal examination. Electronically Signed   By: Claudie Revering M.D.   On: 11/15/2014 14:32   Ct Abdomen Pelvis W Contrast  11/15/2014  CLINICAL DATA:  Worsening right upper quadrant abdominal/ chest pain for 1 month. EXAM: CT ABDOMEN AND PELVIS WITH CONTRAST TECHNIQUE: Multidetector CT imaging of the abdomen and pelvis was performed using the standard protocol following bolus administration of intravenous contrast. CONTRAST:  11m OMNIPAQUE IOHEXOL 300 MG/ML SOLN, 1024mOMNIPAQUE IOHEXOL 300 MG/ML SOLN COMPARISON:  09/02/2009 CT abdomen/pelvis.  11/14/2010 MRI abdomen. FINDINGS: Lower chest: Partially visualized 5 mm ground-glass right lower lobe pulmonary nodule (series 6/image 1), likely stable since 2011. Hepatobiliary: Diffuse hepatic steatosis. No liver mass. Status post cholecystectomy. No intrahepatic biliary ductal dilatation. Stable dilated common bile duct (8 mm diameter), within expected post cholecystectomy limits. Pancreas: Stable calcifications throughout the pancreatic head, in keeping with chronic pancreatitis. No peripancreatic fat stranding. No main pancreatic duct dilation. No pancreatic mass. Spleen: Normal size. No mass. Adrenals/Urinary Tract: Stable 2.8 cm right adrenal adenoma, unchanged since 2011. Normal left adrenal. No hydronephrosis. Simple 1.2 cm renal cyst in the posterior lower right kidney. Additional subcentimeter hypodense lesions in both kidneys are too small to characterize. Normal bladder. Stomach/Bowel: Grossly normal stomach. Normal caliber small bowel with no small bowel wall thickening. Appendix is not discretely visualized. There is  marked diffuse colonic diverticulosis, most prominent in the descending and sigmoid colon. No colonic wall thickening. Mild pericolonic fat stranding at the junction of the descending and sigmoid colon appears unchanged since 2011. Vascular/Lymphatic: Atherosclerotic nonaneurysmal abdominal aorta. Patent portal, splenic, hepatic and renal veins. No pathologically enlarged lymph nodes in the abdomen or pelvis. Reproductive: Grossly normal uterus.  No adnexal mass. Other: No pneumoperitoneum, ascites or focal fluid collection. Musculoskeletal: No aggressive appearing focal osseous lesions. Mild degenerative changes in the thoracolumbar spine. IMPRESSION: 1. No acute abnormality. Marked colonic diverticulosis, with no convincing evidence of acute diverticulitis. No evidence of bowel obstruction or acute bowel inflammation. 2. Status post cholecystectomy. Bile ducts are stable and within expected post cholecystectomy limits. 3. Stable findings of chronic pancreatitis in the pancreatic head. No evidence of acute pancreatitis. 4. Diffuse hepatic steatosis. 5. Stable right adrenal adenoma. Electronically Signed   By: JaIlona Sorrel.D.   On: 11/15/2014 14:39   I have personally reviewed and evaluated these images and lab results as part of my medical decision-making.   EKG Interpretation   Date/Time:  Sunday November 15 2014 11:40:47 EST Ventricular Rate:  81 PR Interval:  126 QRS Duration: 87 QT Interval:  364 QTC Calculation: 422 R Axis:   77 Text Interpretation:  Sinus rhythm No significant change since last  tracing Confirmed by Zenia Guest  MD, Zuley Lutter ([id number] 11/15/2014 12:39:53 PM      MDM   Final diagnoses:  Generalized abdominal pain  Myalgia    Patient with multiple complaints at presentation. The main one was right-sided abdominal pain and right-sided chest pain. Patient also with pain throughout the whole right side of the body. In some to her joints on  the left. Patient known to have a  history of arthritis. Family was concerned about abdominal distention and abdominal pain which was the main complaint this morning. Patient states that she's had abdominal pain on and off for over a month.  Extensive workup to include chest x-ray that was negative EKG without acute changes troponin that was negative.  Head CT was done for complaint of headache and the right-sided extremity pain.  CT of the abdomen without any acute findings at all. Abdominal labs without any abnormalities. No leukocytosis no liver function test abnormalities. Some evidence of some chronic inflammation of the pancreas but lipase is normal. No evidence of any bowel obstruction no evidence of constipation. No evidence of diverticulitis.  Patient received fentanyl low-dose and significant improvement patient with no further complaints. Patient has hydrocodone at home did not take any today. Family will have her take this as needed and will follow-up with her primary care doctor.  I personally performed the services described in this documentation, which was scribed in my presence. The recorded information has been reviewed and is accurate.      Fredia Sorrow, MD 11/15/14 [phone]

## 2014-11-15 NOTE — Discharge Instructions (Signed)
Continue your hydrocodone that you have at home for the pain. Today's extensive workup without any significant findings fortunately. Return for any new or worse symptoms.

## 2014-11-23 ENCOUNTER — Encounter: Payer: Self-pay | Admitting: Pediatrics

## 2014-11-23 ENCOUNTER — Ambulatory Visit (INDEPENDENT_AMBULATORY_CARE_PROVIDER_SITE_OTHER): Payer: Medicare Other | Admitting: Pediatrics

## 2014-11-23 VITALS — BP 123/78 | HR 90 | Temp 97.1°F | Ht 63.0 in | Wt 184.6 lb

## 2014-11-23 DIAGNOSIS — F419 Anxiety disorder, unspecified: Secondary | ICD-10-CM

## 2014-11-23 DIAGNOSIS — M25561 Pain in right knee: Secondary | ICD-10-CM | POA: Diagnosis not present

## 2014-11-23 DIAGNOSIS — M25562 Pain in left knee: Secondary | ICD-10-CM

## 2014-11-23 DIAGNOSIS — R42 Dizziness and giddiness: Secondary | ICD-10-CM | POA: Insufficient documentation

## 2014-11-23 DIAGNOSIS — I10 Essential (primary) hypertension: Secondary | ICD-10-CM | POA: Diagnosis not present

## 2014-11-23 MED ORDER — MELOXICAM 7.5 MG PO TABS: ORAL_TABLET | ORAL | Status: DC

## 2014-11-23 MED ORDER — GABAPENTIN 300 MG PO CAPS: 300.0000 mg | ORAL_CAPSULE | Freq: Two times a day (BID) | ORAL | Status: DC

## 2014-11-23 MED ORDER — AMLODIPINE BESYLATE 5 MG PO TABS: ORAL_TABLET | ORAL | Status: DC

## 2014-11-23 MED ORDER — HYDROCODONE-ACETAMINOPHEN 5-325 MG PO TABS: 1.0000 | ORAL_TABLET | Freq: Three times a day (TID) | ORAL | Status: DC | PRN

## 2014-11-23 MED ORDER — MECLIZINE HCL 25 MG PO TABS: ORAL_TABLET | ORAL | Status: DC

## 2014-11-23 NOTE — Progress Notes (Signed)
Subjective:    Patient ID: [name], female    DOB: [birth year], 81 y.o.   MRN: [id number]  CC: abd pain f/u  HPI: [name] is a 81 y.o. female presenting on 11/23/2014 for Hospitalization Follow-up  Abd pain ongoing for over 1-2 months Had EGD a couple years ago Had colonoscopy 2 years ago, with divericulosis Still with abd pain that comes and goes Went away with fentanyl in the ED Appetite is down, eats some every day CT scan in the hospital with some slight chronic pancreatitis, lipase normal Pt denies any prior history of pancreatitis Hydrocodone helps some  Relevant past medical, surgical, family and social history reviewed and updated as indicated. Interim medical history since our last visit reviewed. Allergies and medications reviewed and updated.   ROS: Per HPI unless specifically indicated above  Past Medical History Patient Active Problem List   Diagnosis Date Noted  . Vertigo 11/23/2014  . Essential hypertension 05/20/2014  . Knee pain, bilateral 05/20/2014  . Depression 05/01/2014  . Hematochezia 08/08/2012  . Abdominal pain, other specified site 08/08/2012  . Tachycardia 08/08/2012  . GI bleed 08/08/2012  . AVM (arteriovenous malformation) of colon   . COPD with exacerbation ([place]) 10/26/2011  . Pulmonary emphysema ([place]) 10/26/2011  . Anemia 10/26/2011  . Dehydration 10/26/2011  . Anxiety 10/26/2011  . GERD (gastroesophageal reflux disease) 10/26/2011  . Acute respiratory failure with hypoxia ([place]) 10/26/2011  . Diverticulosis 10/09/2009    Current Outpatient Prescriptions  Medication Sig Dispense Refill  . amLODipine (NORVASC) 5 MG tablet TAKE 1 TABLET (5 MG TOTAL) BY MOUTH DAILY. 30 tablet 4  . DULoxetine (CYMBALTA) 30 MG capsule TAKE 1 CAPSULE (30 MG TOTAL) BY MOUTH DAILY. 30 capsule 5  . FERREX 150 150 MG capsule TAKE 1 CAPSULE (150 MG TOTAL) BY MOUTH DAILY. 30 capsule 3  . gabapentin (NEURONTIN) 300 MG capsule Take 1 capsule  (300 mg total) by mouth 2 (two) times daily. 60 capsule 5  . HYDROcodone-acetaminophen (NORCO/VICODIN) 5-325 MG tablet Take 1 tablet by mouth 3 (three) times daily as needed for moderate pain. 90 tablet 0  . meclizine (ANTIVERT) 25 MG tablet TAKE 1 TABLET (25 MG TOTAL) BY MOUTH 2 (TWO) TIMES DAILY. 60 tablet 2  . meloxicam (MOBIC) 7.5 MG tablet TAKE 1 TABLET (7.5 MG TOTAL) BY MOUTH DAILY. 30 tablet 0  . PROAIR HFA 108 (90 BASE) MCG/ACT inhaler INHALE 2 PUFFS INTO THE LUNGS EVERY 6 (SIX) HOURS AS NEEDED FOR WHEEZING OR SHORTNESS OF BREATH. 8.5 Inhaler 3  . Psyllium (METAMUCIL PO) Take 1 capsule by mouth daily.     No current facility-administered medications for this visit.       Objective:    BP 123/78 mmHg  Pulse 90  Temp(Src) 97.1 F (36.2 C) (Oral)  Ht '5\' 3"'$  (1.6 m)  Wt 184 lb 9.6 oz (83.734 kg)  BMI 32.71 kg/m2  Wt Readings from Last 3 Encounters:  11/23/14 184 lb 9.6 oz (83.734 kg)  11/15/14 175 lb (79.379 kg)  09/16/14 183 lb (83.008 kg)     Gen: NAD, alert, cooperative with exam, NCAT, in NAD EYES: EOMI, no scleral injection or icterus ENT:  TMs pearly gray b/l, OP without erythema LYMPH: no cervical LAD CV: NRRR, normal S1/S2, no murmur, distal pulses 2+ b/l Resp: CTABL, no wheezes, normal WOB Abd: +BS, soft, mildly distended, mildly tender epigastric area. no guarding or organomegaly Ext: No edema, warm Neuro: Alert and oriented, strength equal  b/l UE and LE, coordination grossly normal MSK: normal muscle bulk     Assessment & Plan:   [name] was seen today for hospitalization follow-up.  Diagnoses and all orders for this visit:  Anxiety Continue cymbalta.  Vertigo Discussed vestibular rehab, PT for gait training. Pt strongly opposed to PT. Encouraged walker use at home to prevent falls. She thinks the meclizine helps. -     meclizine (ANTIVERT) 25 MG tablet; TAKE 1 TABLET (25 MG TOTAL) BY MOUTH 2 (TWO) TIMES DAILY.  Knee pain, bilateral -     gabapentin  (NEURONTIN) 300 MG capsule; Take 1 capsule (300 mg total) by mouth 2 (two) times daily. -     HYDROcodone-acetaminophen (NORCO/VICODIN) 5-325 MG tablet; Take 1 tablet by mouth 3 (three) times daily as needed for moderate pain. -     meloxicam (MOBIC) 7.5 MG tablet; TAKE 1 TABLET (7.5 MG TOTAL) BY MOUTH DAILY.  Essential hypertension -     amLODipine (NORVASC) 5 MG tablet; TAKE 1 TABLET (5 MG TOTAL) BY MOUTH DAILY.  Abd pain: [place] ddx, normal CT. Tolerating food fine, is on meloxicam, if pain continues may need repeat EGD.   Follow up plan: Return in about 4 weeks (around 12/21/2014).  [name], MD [facility] 11/23/2014, 11:31 AM

## 2014-12-21 ENCOUNTER — Ambulatory Visit: Payer: Self-pay | Admitting: Pediatrics

## 2014-12-23 ENCOUNTER — Ambulatory Visit: Payer: Self-pay | Admitting: Pediatrics

## 2014-12-29 ENCOUNTER — Encounter: Payer: Self-pay | Admitting: Pediatrics

## 2014-12-29 ENCOUNTER — Ambulatory Visit (INDEPENDENT_AMBULATORY_CARE_PROVIDER_SITE_OTHER): Payer: Medicare Other | Admitting: Pediatrics

## 2014-12-29 VITALS — BP 123/78 | HR 79 | Temp 96.9°F | Ht 63.0 in | Wt 185.4 lb

## 2014-12-29 DIAGNOSIS — M25561 Pain in right knee: Secondary | ICD-10-CM

## 2014-12-29 DIAGNOSIS — I10 Essential (primary) hypertension: Secondary | ICD-10-CM

## 2014-12-29 DIAGNOSIS — F32A Depression, unspecified: Secondary | ICD-10-CM

## 2014-12-29 DIAGNOSIS — F329 Major depressive disorder, single episode, unspecified: Secondary | ICD-10-CM | POA: Diagnosis not present

## 2014-12-29 DIAGNOSIS — Z23 Encounter for immunization: Secondary | ICD-10-CM

## 2014-12-29 DIAGNOSIS — M1710 Unilateral primary osteoarthritis, unspecified knee: Secondary | ICD-10-CM | POA: Insufficient documentation

## 2014-12-29 DIAGNOSIS — M170 Bilateral primary osteoarthritis of knee: Secondary | ICD-10-CM | POA: Diagnosis not present

## 2014-12-29 DIAGNOSIS — F419 Anxiety disorder, unspecified: Secondary | ICD-10-CM | POA: Diagnosis not present

## 2014-12-29 DIAGNOSIS — M179 Osteoarthritis of knee, unspecified: Secondary | ICD-10-CM | POA: Insufficient documentation

## 2014-12-29 DIAGNOSIS — M25562 Pain in left knee: Secondary | ICD-10-CM

## 2014-12-29 MED ORDER — MELOXICAM 7.5 MG PO TABS: ORAL_TABLET | ORAL | Status: DC

## 2014-12-29 MED ORDER — HYDROCODONE-ACETAMINOPHEN 5-325 MG PO TABS: 1.0000 | ORAL_TABLET | Freq: Two times a day (BID) | ORAL | Status: DC | PRN

## 2014-12-29 MED ORDER — DULOXETINE HCL 20 MG PO CPEP: 20.0000 mg | ORAL_CAPSULE | Freq: Every day | ORAL | Status: DC

## 2014-12-29 NOTE — Patient Instructions (Signed)
Goal to have daily bowel movements so you dont get constipated on iron and pain medicines  Take docusate '100mg'$  caps twice a day  Take miralax 1 packet in the morning and at lunch if havent stooled for 24 hours  Start cymbalta, take one cap daily

## 2014-12-29 NOTE — Progress Notes (Signed)
Subjective:    Patient ID: [name], female    DOB: Dec 05, 1928, 81 y.o.   MRN: [id number]  CC: Follow-up multiple med problems  HPI: [name] is a 81 y.o. female presenting for Follow-up  Doing well Still has some abd pain at times Had a hairline fracture on R side of rib 10 years ago that daughter wonders is contributing, pt with no pain with deep breaths.  Pt points to knees, shoulders, hands when asked where her pain is When she lays down and is still the pain goes away Has pain in her hands that comes and goes With the pain medicine she gets up and does not hurt as much moving around th ehouse Pt and daughter both think the medicine helps some with the pain, though does not take it completely away Pain has been worse since stopping the cymbatla, was not able to tolerate the celexa so didn't take it after 3-4 days Daughter cooks for her and her husband 5 days a week, getting lots of vegetbales in [place] metamucil every day Bowel movements every other day Thinking has been very clear No falls    Depression screen [facility] 2/9 12/29/2014 04/10/2014 09/09/2013 06/04/2013  Decreased Interest 0 3 0 0  Down, Depressed, Hopeless 0 3 0 0  PHQ - 2 Score 0 6 0 0  Altered sleeping - 3 - -  Tired, decreased energy - 3 - -  Change in appetite - 2 - -  Feeling bad or failure about yourself  - 2 - -  Trouble concentrating - 0 - -  Moving slowly or fidgety/restless - 0 - -  Suicidal thoughts - 0 - -  PHQ-9 Score - 16 - -  Difficult doing work/chores - Somewhat difficult - -     Relevant past medical, surgical, family and social history reviewed and updated as indicated. Interim medical history since our last visit reviewed. Allergies and medications reviewed and updated.    ROS: Per HPI unless specifically indicated above  History  Smoking status  . Former Smoker -- 1.00 packs/day for 65 years  . Quit date: 10/26/2006  Smokeless tobacco  . Never Used    Past  Medical History Patient Active Problem List   Diagnosis Date Noted  . Osteoarthritis of knee 12/29/2014  . Vertigo 11/23/2014  . Essential hypertension 05/20/2014  . Knee pain, bilateral 05/20/2014  . Depression 05/01/2014  . Hematochezia 08/08/2012  . Abdominal pain, other specified site 08/08/2012  . Tachycardia 08/08/2012  . GI bleed 08/08/2012  . AVM (arteriovenous malformation) of colon   . COPD with exacerbation ([place]) 10/26/2011  . Pulmonary emphysema ([place]) 10/26/2011  . Anemia 10/26/2011  . Dehydration 10/26/2011  . Anxiety 10/26/2011  . GERD (gastroesophageal reflux disease) 10/26/2011  . Acute respiratory failure with hypoxia ([place]) 10/26/2011  . Diverticulosis 10/09/2009    Current Outpatient Prescriptions  Medication Sig Dispense Refill  . amLODipine (NORVASC) 5 MG tablet TAKE 1 TABLET (5 MG TOTAL) BY MOUTH DAILY. 30 tablet 4  . FERREX 150 150 MG capsule TAKE 1 CAPSULE (150 MG TOTAL) BY MOUTH DAILY. 30 capsule 3  . gabapentin (NEURONTIN) 300 MG capsule Take 1 capsule (300 mg total) by mouth 2 (two) times daily. 60 capsule 5  . HYDROcodone-acetaminophen (NORCO/VICODIN) 5-325 MG tablet Take 1 tablet by mouth 2 (two) times daily as needed for moderate pain. 60 tablet 0  . meclizine (ANTIVERT) 25 MG tablet TAKE 1 TABLET (25 MG TOTAL) BY  MOUTH 2 (TWO) TIMES DAILY. 60 tablet 2  . meloxicam (MOBIC) 7.5 MG tablet TAKE 1 TABLET (7.5 MG TOTAL) BY MOUTH DAILY. 30 tablet 3  . PROAIR HFA 108 (90 BASE) MCG/ACT inhaler INHALE 2 PUFFS INTO THE LUNGS EVERY 6 (SIX) HOURS AS NEEDED FOR WHEEZING OR SHORTNESS OF BREATH. 8.5 Inhaler 3  . Psyllium (METAMUCIL PO) Take 1 capsule by mouth daily.    . DULoxetine (CYMBALTA) 20 MG capsule Take 1 capsule (20 mg total) by mouth daily. 30 capsule 2   No current facility-administered medications for this visit.       Objective:    BP 123/78 mmHg  Pulse 79  Temp(Src) 96.9 F (36.1 C) (Oral)  Ht '5\' 3"'$  (1.6 m)  Wt 185 lb 6.4 oz (84.097 kg)  BMI  32.85 kg/m2  Wt Readings from Last 3 Encounters:  12/29/14 185 lb 6.4 oz (84.097 kg)  11/23/14 184 lb 9.6 oz (83.734 kg)  11/15/14 175 lb (79.379 kg)     Gen: NAD, alert, cooperative with exam, NCAT EYES: EOMI, no scleral injection or icterus ENT:  OP without erythema LYMPH: no cervical LAD CV: NRRR, normal S1/S2, no murmur, distal pulses 2+ b/l Resp: CTABL, no wheezes, normal WOB Abd: +BS, soft, NTND. no guarding or organomegaly Ext: No edema, warm Neuro: Alert and oriented, strength equal b/l UE, coordination grossly normal MSK: R knee with vertical scar from replacement, no effusions b/l knees, no synovitis in hands, ROM equal b/l shoulders, has some pain with movement     Assessment & Plan:    [name] was seen today for follow-up multiple medical problems and knee pain.  Diagnoses and all orders for this visit:  Essential hypertension Well controlled today, continue current meds  Osteoarthritis of both knees, unspecified osteoarthritis type Pt still has some pain with daily ADLs on below medicines but they do help her get up and do what she wants to every day. Will continue the below. -     HYDROcodone-acetaminophen (NORCO/VICODIN) 5-325 MG tablet; Take 1 tablet by mouth 2 (two) times daily as needed for moderate pain. Three Rx written, dated 12/29/2014, 01/29/2015, 03/01/2015 for #60 tabs, Discussed may not share medicines, we cannot replace lost scripts or pills. Daughter who is with her today in clinic is in charge of her medications. -     meloxicam (MOBIC) 7.5 MG tablet; TAKE 1 TABLET (7.5 MG TOTAL) BY MOUTH DAILY.  Depression Mood has been up and down, also with some anxiety. PHQ9 score 0 today, has been elevated in the past. Pt interested in starting a medicine, daughter in agreement. Start cymbalta daily, also may help some with pain.  -     DULoxetine (CYMBALTA) 20 MG capsule; Take 1 capsule (20 mg total) by mouth daily.  Constipation discussed bowel regimen while on  narcotics and iron. Goal to have daily bowel movements so you dont get constipated on iron and pain medicines Take docusate '100mg'$  caps twice a day Take miralax 1 packet in the morning and at lunch if havent stooled for 24 hours  Follow up plan: Return in about 3 months (around 03/29/2015).  [name], MD [facility] 12/29/2014, 11:14 AM

## 2015-01-10 HISTORY — PX: CATARACT EXTRACTION: SUR2

## 2015-02-03 ENCOUNTER — Other Ambulatory Visit: Payer: Self-pay | Admitting: Family Medicine

## 2015-03-03 ENCOUNTER — Other Ambulatory Visit: Payer: Self-pay | Admitting: Family Medicine

## 2015-03-05 ENCOUNTER — Other Ambulatory Visit: Payer: Self-pay | Admitting: Pediatrics

## 2015-03-09 ENCOUNTER — Other Ambulatory Visit: Payer: Self-pay | Admitting: Pediatrics

## 2015-03-27 ENCOUNTER — Other Ambulatory Visit: Payer: Self-pay | Admitting: Pediatrics

## 2015-03-29 ENCOUNTER — Other Ambulatory Visit: Payer: Self-pay | Admitting: Pediatrics

## 2015-03-30 ENCOUNTER — Encounter: Payer: Self-pay | Admitting: Family Medicine

## 2015-03-30 ENCOUNTER — Ambulatory Visit (INDEPENDENT_AMBULATORY_CARE_PROVIDER_SITE_OTHER): Payer: Medicare Other | Admitting: Family Medicine

## 2015-03-30 VITALS — BP 132/74 | HR 83 | Temp 97.7°F | Ht 63.0 in | Wt 178.4 lb

## 2015-03-30 DIAGNOSIS — R05 Cough: Secondary | ICD-10-CM

## 2015-03-30 DIAGNOSIS — R059 Cough, unspecified: Secondary | ICD-10-CM

## 2015-03-30 LAB — VERITOR FLU A/B WAIVED
INFLUENZA A: NEGATIVE
Influenza B: NEGATIVE

## 2015-03-30 MED ORDER — DOXYCYCLINE HYCLATE 100 MG PO TABS: 100.0000 mg | ORAL_TABLET | Freq: Two times a day (BID) | ORAL | Status: DC

## 2015-03-30 MED ORDER — HYDROCODONE-HOMATROPINE 5-1.5 MG/5ML PO SYRP: 5.0000 mL | ORAL_SOLUTION | Freq: Three times a day (TID) | ORAL | Status: DC | PRN

## 2015-03-30 MED ORDER — CIPROFLOXACIN HCL 250 MG PO TABS: 250.0000 mg | ORAL_TABLET | Freq: Two times a day (BID) | ORAL | Status: DC

## 2015-03-30 NOTE — Patient Instructions (Signed)
Thank you for allowing us to care for you today. We strive to provide exceptional quality and compassionate care. Please let us know how we are doing and how we can help serve you better by filling out the survey that you receive from [employer].     

## 2015-03-30 NOTE — Progress Notes (Signed)
   Subjective:    Patient ID: [name], female    DOB: 1928/01/22, 82 y.o.   MRN: [id number]  HPI 82 year old female with a 2 day history of cough fever congestion. She took Mucinex yesterday. She feels like she needs to cough something up but is unable. She has had some shortness of breath and myalgias as well.  Patient Active Problem List   Diagnosis Date Noted  . Osteoarthritis of knee 12/29/2014  . Vertigo 11/23/2014  . Essential hypertension 05/20/2014  . Knee pain, bilateral 05/20/2014  . Depression 05/01/2014  . Hematochezia 08/08/2012  . Abdominal pain, other specified site 08/08/2012  . Tachycardia 08/08/2012  . GI bleed 08/08/2012  . AVM (arteriovenous malformation) of colon   . COPD with exacerbation ([place]) 10/26/2011  . Pulmonary emphysema ([place]) 10/26/2011  . Anemia 10/26/2011  . Dehydration 10/26/2011  . Anxiety 10/26/2011  . GERD (gastroesophageal reflux disease) 10/26/2011  . Acute respiratory failure with hypoxia ([place]) 10/26/2011  . Diverticulosis 10/09/2009   Outpatient Encounter Prescriptions as of 03/30/2015  Medication Sig  . amLODipine (NORVASC) 5 MG tablet TAKE 1 TABLET (5 MG TOTAL) BY MOUTH DAILY.  . DULoxetine (CYMBALTA) 20 MG capsule TAKE 1 CAPSULE (20 MG TOTAL) BY MOUTH DAILY.  Marland Kitchen FERREX 150 150 MG capsule TAKE 1 CAPSULE (150 MG TOTAL) BY MOUTH DAILY.  Marland Kitchen gabapentin (NEURONTIN) 300 MG capsule Take 1 capsule (300 mg total) by mouth 2 (two) times daily.  Marland Kitchen HYDROcodone-acetaminophen (NORCO/VICODIN) 5-325 MG tablet Take 1 tablet by mouth 2 (two) times daily as needed for moderate pain.  . meclizine (ANTIVERT) 25 MG tablet TAKE 1 TABLET (25 MG TOTAL) BY MOUTH 2 (TWO) TIMES DAILY.  . meloxicam (MOBIC) 7.5 MG tablet TAKE 1 TABLET (7.5 MG TOTAL) BY MOUTH DAILY.  . meloxicam (MOBIC) 7.5 MG tablet TAKE 1 TABLET (7.5 MG TOTAL) BY MOUTH DAILY.  Marland Kitchen PROAIR HFA 108 (90 Base) MCG/ACT inhaler INHALE 2 PUFFS INTO THE LUNGS EVERY 6 (SIX) HOURS AS NEEDED FOR WHEEZING OR  SHORTNESS OF BREATH.  Marland Kitchen Psyllium (METAMUCIL PO) Take 1 capsule by mouth daily.   No facility-administered encounter medications on file as of 03/30/2015.      Review of Systems  Constitutional: Negative.   HENT: Positive for congestion.   Respiratory: Positive for cough.   Cardiovascular: Negative.   Genitourinary: Negative.   Neurological: Negative.   Psychiatric/Behavioral: Negative.        Objective:   Physical Exam  Constitutional: She is oriented to person, place, and time. She appears well-developed and well-nourished.  HENT:  Right Ear: External ear normal.  Left Ear: External ear normal.  Mouth/Throat: Oropharynx is clear and moist.  Cardiovascular: Normal rate and regular rhythm.   Pulmonary/Chest: Effort normal and breath sounds normal.  Neurological: She is alert and oriented to person, place, and time.  Psychiatric: She has a normal mood and affect. Her behavior is normal.          Assessment & Plan:  1. Cough She is negative for flu. I think symptoms are consistent with bronchitis. Rx doxycycline 100 mg twice a day 1 week and Hycodan for cough suppression. Continue Mucinex.  Wardell Honour MD - Veritor Flu A/B Waived - Ambulatory referral to Physical Therapy

## 2015-04-01 ENCOUNTER — Ambulatory Visit: Payer: Self-pay | Admitting: Pediatrics

## 2015-04-01 DIAGNOSIS — H2513 Age-related nuclear cataract, bilateral: Secondary | ICD-10-CM | POA: Diagnosis not present

## 2015-04-01 DIAGNOSIS — H04123 Dry eye syndrome of bilateral lacrimal glands: Secondary | ICD-10-CM | POA: Diagnosis not present

## 2015-04-01 DIAGNOSIS — H43813 Vitreous degeneration, bilateral: Secondary | ICD-10-CM | POA: Diagnosis not present

## 2015-04-01 DIAGNOSIS — H43393 Other vitreous opacities, bilateral: Secondary | ICD-10-CM | POA: Diagnosis not present

## 2015-04-28 DIAGNOSIS — H02834 Dermatochalasis of left upper eyelid: Secondary | ICD-10-CM | POA: Diagnosis not present

## 2015-04-28 DIAGNOSIS — H43813 Vitreous degeneration, bilateral: Secondary | ICD-10-CM | POA: Diagnosis not present

## 2015-04-28 DIAGNOSIS — H527 Unspecified disorder of refraction: Secondary | ICD-10-CM | POA: Diagnosis not present

## 2015-04-28 DIAGNOSIS — H25813 Combined forms of age-related cataract, bilateral: Secondary | ICD-10-CM | POA: Diagnosis not present

## 2015-04-28 DIAGNOSIS — H02831 Dermatochalasis of right upper eyelid: Secondary | ICD-10-CM | POA: Diagnosis not present

## 2015-04-29 ENCOUNTER — Other Ambulatory Visit: Payer: Self-pay | Admitting: Pediatrics

## 2015-05-06 ENCOUNTER — Other Ambulatory Visit: Payer: Self-pay | Admitting: Nurse Practitioner

## 2015-05-07 NOTE — Telephone Encounter (Signed)
Patient of Dr Evette Doffing. Last OV was 3-21 for an acute visit. Last refill on this med was 3-20 for #60. Please advise on additional refills and route to Pool B so nurse can phone in to pharmacy as their systems are down

## 2015-05-10 ENCOUNTER — Other Ambulatory Visit: Payer: Self-pay | Admitting: Nurse Practitioner

## 2015-05-19 ENCOUNTER — Other Ambulatory Visit: Payer: Self-pay | Admitting: Pediatrics

## 2015-05-26 ENCOUNTER — Other Ambulatory Visit: Payer: Self-pay | Admitting: Family

## 2015-05-26 ENCOUNTER — Other Ambulatory Visit: Payer: Self-pay | Admitting: Pediatrics

## 2015-06-17 ENCOUNTER — Encounter: Payer: Self-pay | Admitting: Nurse Practitioner

## 2015-06-17 ENCOUNTER — Ambulatory Visit (INDEPENDENT_AMBULATORY_CARE_PROVIDER_SITE_OTHER): Payer: Medicare Other | Admitting: Nurse Practitioner

## 2015-06-17 VITALS — BP 112/74 | HR 90 | Temp 97.2°F | Ht 63.0 in | Wt 178.0 lb

## 2015-06-17 DIAGNOSIS — M5441 Lumbago with sciatica, right side: Secondary | ICD-10-CM

## 2015-06-17 MED ORDER — PREDNISONE 20 MG PO TABS: ORAL_TABLET | ORAL | Status: DC

## 2015-06-17 NOTE — Progress Notes (Signed)
   Subjective:    Patient ID: [name], female    DOB: 12-Sep-1928, 82 y.o.   MRN: [id number]  HPI  Patient brought in today by her daughter c/o back pain- pain radiates to right hip and down leg- started several months ago but daughter says she has been complaining of increasing pain the last several days. Rates pain 8-9/10. Standing up increases pain. Lying down helps decrease pain.   Review of Systems  Constitutional: Negative.   HENT: Negative.   Respiratory: Negative.   Cardiovascular: Negative.   Genitourinary: Negative.   Neurological: Negative.   Psychiatric/Behavioral: Negative.   All other systems reviewed and are negative.      Objective:   Physical Exam  Constitutional: She is oriented to person, place, and time. She appears well-developed and well-nourished. No distress.  Cardiovascular: Normal rate, regular rhythm and normal heart sounds.   Pulmonary/Chest: Effort normal and breath sounds normal.  Musculoskeletal:  Low back pain on palpation Pain on flexion and extension of lumbar spine (-) SLR bil Motor strength and sensation distally intact  Neurological: She is alert and oriented to person, place, and time.  Skin: Skin is warm.  Psychiatric: She has a normal mood and affect. Her behavior is normal. Judgment and thought content normal.   BP 112/74 mmHg  Pulse 90  Temp(Src) 97.2 F (36.2 C) (Oral)  Ht '5\' 3"'$  (1.6 m)  Wt 178 lb (80.74 kg)  BMI 31.54 kg/m2        Assessment & Plan:   1. Bilateral low back pain with right-sided sciatica    Meds ordered this encounter  Medications  . predniSONE (DELTASONE) 20 MG tablet    Sig: 2 po at sametime daily for 5 days    Dispense:  10 tablet    Refill:  0    Order Specific Question:  Supervising Provider    Answer:  Chipper Herb [[id number]]  Hold mobic while on steroid Moist heat or ice which ever works best Back stretches RTO prn  Mary-Margaret Hassell Done, FNP

## 2015-06-17 NOTE — Patient Instructions (Signed)

## 2015-06-24 ENCOUNTER — Other Ambulatory Visit: Payer: Self-pay | Admitting: Family

## 2015-06-25 DIAGNOSIS — H25813 Combined forms of age-related cataract, bilateral: Secondary | ICD-10-CM | POA: Diagnosis not present

## 2015-06-25 DIAGNOSIS — H40003 Preglaucoma, unspecified, bilateral: Secondary | ICD-10-CM | POA: Diagnosis not present

## 2015-07-08 ENCOUNTER — Other Ambulatory Visit: Payer: Self-pay | Admitting: Nurse Practitioner

## 2015-07-09 ENCOUNTER — Other Ambulatory Visit: Payer: Self-pay | Admitting: Pediatrics

## 2015-07-09 NOTE — Telephone Encounter (Signed)
Last CBC, hgb was high

## 2015-07-12 DIAGNOSIS — H2512 Age-related nuclear cataract, left eye: Secondary | ICD-10-CM | POA: Diagnosis not present

## 2015-07-12 DIAGNOSIS — H25812 Combined forms of age-related cataract, left eye: Secondary | ICD-10-CM | POA: Diagnosis not present

## 2015-07-23 ENCOUNTER — Other Ambulatory Visit: Payer: Self-pay | Admitting: Pediatrics

## 2015-07-29 ENCOUNTER — Other Ambulatory Visit: Payer: Self-pay | Admitting: Pediatrics

## 2015-07-30 NOTE — Telephone Encounter (Signed)
Last seen by MMM at June appt   [name] pt and orig rx'd for knee pain - last seen Dec 2016

## 2015-08-02 DIAGNOSIS — H25811 Combined forms of age-related cataract, right eye: Secondary | ICD-10-CM | POA: Diagnosis not present

## 2015-08-02 DIAGNOSIS — H2511 Age-related nuclear cataract, right eye: Secondary | ICD-10-CM | POA: Diagnosis not present

## 2015-09-05 ENCOUNTER — Other Ambulatory Visit: Payer: Self-pay | Admitting: Nurse Practitioner

## 2015-09-07 DIAGNOSIS — H52223 Regular astigmatism, bilateral: Secondary | ICD-10-CM | POA: Diagnosis not present

## 2015-09-07 DIAGNOSIS — Z961 Presence of intraocular lens: Secondary | ICD-10-CM | POA: Diagnosis not present

## 2015-09-07 DIAGNOSIS — H524 Presbyopia: Secondary | ICD-10-CM | POA: Diagnosis not present

## 2015-09-07 DIAGNOSIS — H5203 Hypermetropia, bilateral: Secondary | ICD-10-CM | POA: Diagnosis not present

## 2015-09-07 DIAGNOSIS — H04123 Dry eye syndrome of bilateral lacrimal glands: Secondary | ICD-10-CM | POA: Diagnosis not present

## 2015-09-08 ENCOUNTER — Other Ambulatory Visit: Payer: Self-pay | Admitting: Pediatrics

## 2015-09-12 ENCOUNTER — Other Ambulatory Visit: Payer: Self-pay | Admitting: Nurse Practitioner

## 2015-09-20 ENCOUNTER — Encounter: Payer: Self-pay | Admitting: Pharmacist

## 2015-09-20 ENCOUNTER — Ambulatory Visit (INDEPENDENT_AMBULATORY_CARE_PROVIDER_SITE_OTHER): Payer: Medicare Other | Admitting: Pharmacist

## 2015-09-20 VITALS — BP 136/78 | HR 84 | Ht 64.0 in | Wt 179.5 lb

## 2015-09-20 DIAGNOSIS — Z0000 Encounter for general adult medical examination without abnormal findings: Secondary | ICD-10-CM | POA: Diagnosis not present

## 2015-09-20 DIAGNOSIS — M8580 Other specified disorders of bone density and structure, unspecified site: Secondary | ICD-10-CM | POA: Diagnosis not present

## 2015-09-20 MED ORDER — RISEDRONATE SODIUM 150 MG PO TABS: 150.0000 mg | ORAL_TABLET | ORAL | 3 refills | Status: DC

## 2015-09-20 MED ORDER — MUPIROCIN 2 % EX OINT: 1.0000 "application " | TOPICAL_OINTMENT | Freq: Two times a day (BID) | CUTANEOUS | 0 refills | Status: DC

## 2015-09-20 MED ORDER — ACETAMINOPHEN 500 MG PO TABS: 500.0000 mg | ORAL_TABLET | Freq: Every day | ORAL | 0 refills | Status: AC

## 2015-09-20 NOTE — Progress Notes (Signed)
Patient ID: [name], female   DOB: [birth year], 82 y.o.   MRN: [id number]    Subjective:   [name] is a 82 y.o. female who presents for a subsequent Medicare Annual Wellness Visit.  [name] lives at home with her husband. She is retired from [employer] work.  Her daughter [name], who accompanies her today, lives next door. She helps with her medciation, meals and errands.   [name]'s main health concerns are arthritis pain.  Pain is located in her back and radiated to hips and legs.  She is currently taking duloxetine that can help with arthritis pain and gabapentin which can help with nerve related pain.  She is also concerned about a bump inside her right nostril.  There is a nasal vestibulitis / folliculitis at the edge of her right nostril.      Current Medications (verified) Outpatient Encounter Prescriptions as of 09/20/2015  Medication Sig  . amLODipine (NORVASC) 5 MG tablet TAKE 1 TABLET (5 MG TOTAL) BY MOUTH DAILY.  . cholecalciferol (VITAMIN D) 1000 units tablet Take 1,000 Units by mouth daily.  . DULoxetine (CYMBALTA) 20 MG capsule TAKE 1 CAPSULE (20 MG TOTAL) BY MOUTH DAILY.  Marland Kitchen FERREX 150 150 MG capsule TAKE 1 CAPSULE (150 MG TOTAL) BY MOUTH DAILY.  Marland Kitchen gabapentin (NEURONTIN) 300 MG capsule TAKE 1 CAPSULE (300 MG TOTAL) BY MOUTH 2 (TWO) TIMES DAILY.  . meclizine (ANTIVERT) 25 MG tablet TAKE 1 TABLET (25 MG TOTAL) BY MOUTH 2 (TWO) TIMES DAILY.  . meloxicam (MOBIC) 7.5 MG tablet TAKE 1 TABLET (7.5 MG TOTAL) BY MOUTH DAILY.  Marland Kitchen PROAIR HFA 108 (90 Base) MCG/ACT inhaler INHALE 2 PUFFS INTO THE LUNGS EVERY 6 (SIX) HOURS AS NEEDED FOR WHEEZING OR SHORTNESS OF BREATH.  . [DISCONTINUED] meclizine (ANTIVERT) 25 MG tablet TAKE 1 TABLET (25 MG TOTAL) BY MOUTH 2 (TWO) TIMES DAILY.  Marland Kitchen acetaminophen (TYLENOL) 500 MG tablet Take 1-2 tablets (500-1,000 mg total) by mouth daily.  . mupirocin ointment (BACTROBAN) 2 % Place 1 application into the nose 2 (two) times daily.  .  risedronate (ACTONEL) 150 MG tablet Take 1 tablet (150 mg total) by mouth every 30 (thirty) days. with water on empty stomach, nothing by mouth or lie down for next 30 minutes.  . [DISCONTINUED] predniSONE (DELTASONE) 20 MG tablet 2 po at sametime daily for 5 days (Patient not taking: Reported on 09/20/2015)   No facility-administered encounter medications on file as of 09/20/2015.     Allergies (verified) Avelox [moxifloxacin hcl in nacl] and Codeine   History: Past Medical History:  Diagnosis Date  . Acute diverticulitis   . Anemia   . Anxiety   . AVM (arteriovenous malformation) of colon   . Cataract   . Depression   . Diverticulosis 10/2009   Pandiverticulosis, per colonoscopy, Dr. Oneida Alar  . DJD (degenerative joint disease)   . Emphysema   . GERD (gastroesophageal reflux disease)   . Hypertension   . Internal hemorrhoids   . Rectal bleeding    Secondary to diverticulosis, hemorrhoids, and/or cecal AVMs  . Wrist fracture    Past Surgical History:  Procedure Laterality Date  . CATARACT EXTRACTION Bilateral 2017  . COLONOSCOPY  Oct 2011   Dr. Oneida Alar: pancolonic diverticulosis, 2 cecal AVMs s/p ablation, retained stool in left colon, moderate internal hemorrhoids  . ESOPHAGOGASTRODUODENOSCOPY Left 08/10/2012   Procedure: ESOPHAGOGASTRODUODENOSCOPY (EGD);  Surgeon: Daneil Dolin, MD;  Location: [facility];  Service: Endoscopy;  Laterality: Left;  EGD for abdominal pain and GIB  . Right total knee arthroplasty     X2   Family History  Problem Relation Age of Onset  . Asthma Mother   . Cancer Sister     stomach  . Heart attack Brother   . Epilepsy Brother   . Colon cancer Neg Hx    Social History   Occupational History  . Not on file.   Social History Main Topics  . Smoking status: Former Smoker    Packs/day: 1.00    Years: 65.00    Quit date: 10/26/2006  . Smokeless tobacco: Never Used  . Alcohol use No  . Drug use: No  . Sexual activity: No    Do you  feel safe at home?  Yes Are there smokers in your home (other than you)? No  Dietary issues and exercise activities: Current Exercise Habits: The patient does not participate in regular exercise at present, Exercise limited by: neurologic condition(s)  Current Dietary habits:  Daughter provides most meals.  Meals have lots of vegetables.  Patient also drinks 2 glasses of milk daily and has other sources of calcium throughout the day.    Objective:    Today's Vitals   09/20/15 1043  BP: 136/78  Pulse: 84  Weight: 179 lb 8 oz (81.4 kg)  Height: '5\' 4"'  (1.626 m)  PainSc: 4   PainLoc: Leg   Body mass index is 30.81 kg/m.   DEXA from 04/2014 - per patient's daughter this results was not reviewed with them  T-Score for L1-L4 = -1.6 T-Score for neck of left hip = -2.4 T-Score of neck if right hip = -2.1 T-Score for total left hip = -1.8 T-Score for total right hip = -2.1   Activities of Daily Living In your present state of health, do you have any difficulty performing the following activities: 09/20/2015  Hearing? N  Vision? N  Difficulty concentrating or making decisions? Y  Walking or climbing stairs? Y  Dressing or bathing? N  Doing errands, shopping? Y  Preparing Food and eating ? Y  Using the Toilet? N  In the past six months, have you accidently leaked urine? N  Do you have problems with loss of bowel control? N  Managing your Medications? Y  Managing your Finances? Y  Housekeeping or managing your Housekeeping? Y  Some recent data might be hidden     Cardiac Risk Factors include: advanced age (>105mn, >>13women);obesity (BMI >30kg/m2);sedentary lifestyle  Depression Screen PHQ 2/9 Scores 09/20/2015 06/17/2015 12/29/2014 04/10/2014  PHQ - 2 Score 0 0 0 6  PHQ- 9 Score - - - 16     Fall Risk Fall Risk  09/20/2015 06/17/2015 12/29/2014 11/23/2014 04/10/2014  Falls in the past year? Yes No No No Yes  Number falls in past yr: 1 - - - 1  Injury with Fall? No - - - Yes    Risk for fall due to : - - - - History of fall(s)  Follow up Falls prevention discussed - - - -    Cognitive Function: MMSE - Mini Mental State Exam 09/20/2015 04/16/2014 04/10/2014  Orientation to time 1 - 5  Orientation to Place 3 - 5  Registration 3 - 3  Attention/ Calculation 2 - 3  Recall 2 - 3  Language- name 2 objects 2 - 2  Language- repeat 1 - 1  Language- follow 3 step command 3 3 -  Language- read & follow direction 1 - 1  Write a sentence 1 - 1  Copy design 0 - 1  Total score 19 - -    Immunizations and Health Maintenance Immunization History  Administered Date(s) Administered  . Influenza Split 10/27/2011  . Influenza,inj,Quad PF,36+ Mos 12/29/2014  . Pneumococcal Conjugate-13 04/10/2014  . Pneumococcal Polysaccharide-23 05/10/2007  . Td 01/10/1996  . Tdap 04/10/2014  . Zoster 04/10/2014   Health Maintenance Due  Topic Date Due  . INFLUENZA VACCINE  08/10/2015    Patient Care Team: Chevis Pretty, FNP as PCP - General (Nurse Practitioner) Daneil Dolin, MD as Consulting Physician (Gastroenterology) Roseanne Kaufman, MD as Consulting Physician (Orthopedic Surgery) Gaynelle Arabian, MD as Consulting Physician (Orthopedic Surgery)  Indicate any recent Medical Services you may have received from other than [facility] providers in the past year (date may be approximate).    Assessment:    Annual Wellness Visit  Arthritis Osteopenia Nasal folloculitis   Screening Tests Health Maintenance  Topic Date Due  . INFLUENZA VACCINE  08/10/2015  . DEXA SCAN  04/09/2016  . TETANUS/TDAP  04/09/2024  . ZOSTAVAX  Completed  . PNA vac Low Risk Adult  Completed        Plan:   During the course of the visit [name] was educated and counseled about the following appropriate screening and preventive services:   Vaccines to include Pneumoccal, Influenza, Td, Zostavax - UTD on recommended vaccines  Colorectal cancer screening - no longer required  Cardiovascular  disease screening - last EKG was 11-2014  Diabetes screening - UTD and WNL  Bone Denisty / Osteoporosis Screening - reviewed last BMD.  Patient has osteopenia with high fall risk.  Started Actonel 164m 1 tablet monthly.   Continue to eat calcium rich foods - goal is 12066mof calcium daily  Checking vitamin D today - pending  Fall prevention discussed - especially discussed using walker for ambulation  Mammogram / PAP- no longer required  Add APAP 500 to 100019mnce a day  mupirocin ointment - apply to inside nostril bid  Glaucoma screening /  Eye Exam -UTD  Nutrition counseling - continue to eat a variety of fruits and vegetables.  Avoid high fat foods and fried foods  Advanced Directives - UTD  Physical Activity - Discussed chair exercises and increasing activities she enjoys such as dancing.  Follow up with PCP in 1 month - will also get influenza vaccine then   Orders Placed This Encounter  Procedures  . CBC with Differential/Platelet  . CMP14+EGFR  . VITAMIN D 25 Hydroxy (Vit-D Deficiency, Fractures)     Patient Instructions (the written plan) were given to the patient.   EckCherre RobinsharmD   09/20/2015

## 2015-09-20 NOTE — Patient Instructions (Addendum)
[name] , Thank you for taking time to come for your Medicare Wellness Visit. I appreciate your ongoing commitment to your health goals. Please review the following plan we discussed and let me know if I can assist you in the future.   These are the goals we discussed:  Use walker at home to decrease falls  Exercise daily - do chair exercises.  Play music and dance.   Continue to eat calcium rich foods like milk, low fat ice cream and green leafy vegetables.    This is a list of the screening recommended for you and due dates:  Health Maintenance  Topic Date Due  . Flu Shot  08/10/2015  . DEXA scan (bone density measurement)  04/09/2016  . Tetanus Vaccine  04/09/2024  . Shingles Vaccine  Completed  . Pneumonia vaccines  Completed    Fall Prevention in the Home  Falls can cause injuries and can affect people from all age groups. There are many simple things that you can do to make your home safe and to help prevent falls. WHAT CAN I DO ON THE OUTSIDE OF MY HOME?  Regularly repair the edges of walkways and driveways and fix any cracks.  Remove high doorway thresholds.  Trim any shrubbery on the main path into your home.  Use bright outdoor lighting.  Clear walkways of debris and clutter, including tools and rocks.  Regularly check that handrails are securely fastened and in good repair. Both sides of any steps should have handrails.  Install guardrails along the edges of any raised decks or porches.  Have leaves, snow, and ice cleared regularly.  Use sand or salt on walkways during winter months.  In the garage, clean up any spills right away, including grease or oil spills. WHAT CAN I DO IN THE BATHROOM?  Use night lights.  Install grab bars by the toilet and in the tub and shower. Do not use towel bars as grab bars.  Use non-skid mats or decals on the floor of the tub or shower.  If you need to sit down while you are in the shower, use a plastic, non-slip  stool.Marland Kitchen  Keep the floor dry. Immediately clean up any water that spills on the floor.  Remove soap buildup in the tub or shower on a regular basis.  Attach bath mats securely with double-sided non-slip rug tape.  Remove throw rugs and other tripping hazards from the floor. WHAT CAN I DO IN THE BEDROOM?  Use night lights.  Make sure that a bedside light is easy to reach.  Do not use oversized bedding that drapes onto the floor.  Have a firm chair that has side arms to use for getting dressed.  Remove throw rugs and other tripping hazards from the floor. WHAT CAN I DO IN THE KITCHEN?   Clean up any spills right away.  Avoid walking on wet floors.  Place frequently used items in easy-to-reach places.  If you need to reach for something above you, use a sturdy step stool that has a grab bar.  Keep electrical cables out of the way.  Do not use floor polish or wax that makes floors slippery. If you have to use wax, make sure that it is non-skid floor wax.  Remove throw rugs and other tripping hazards from the floor. WHAT CAN I DO IN THE STAIRWAYS?  Do not leave any items on the stairs.  Make sure that there are handrails on both sides of the stairs.  Fix handrails that are broken or loose. Make sure that handrails are as long as the stairways.  Check any carpeting to make sure that it is firmly attached to the stairs. Fix any carpet that is loose or worn.  Avoid having throw rugs at the top or bottom of stairways, or secure the rugs with carpet tape to prevent them from moving.  Make sure that you have a light switch at the top of the stairs and the bottom of the stairs. If you do not have them, have them installed. WHAT ARE SOME OTHER FALL PREVENTION TIPS?  Wear closed-toe shoes that fit well and support your feet. Wear shoes that have rubber soles or low heels.  When you use a stepladder, make sure that it is completely opened and that the sides are firmly locked. Have  someone hold the ladder while you are using it. Do not climb a closed stepladder.  Add color or contrast paint or tape to grab bars and handrails in your home. Place contrasting color strips on the first and last steps.  Use mobility aids as needed, such as canes, walkers, scooters, and crutches.  Turn on lights if it is dark. Replace any light bulbs that burn out.  Set up furniture so that there are clear paths. Keep the furniture in the same spot.  Fix any uneven floor surfaces.  Choose a carpet design that does not hide the edge of steps of a stairway.  Be aware of any and all pets.  Review your medicines with your healthcare provider. Some medicines can cause dizziness or changes in blood pressure, which increase your risk of falling. Talk with your health care provider about other ways that you can decrease your risk of falls. This may include working with a physical therapist or trainer to improve your strength, balance, and endurance.   This information is not intended to replace advice given to you by your health care provider. Make sure you discuss any questions you have with your health care provider.   Document Released: 12/16/2001 Document Revised: 05/12/2014 Document Reviewed: 01/30/2014 Elsevier Interactive Patient Education [employer].

## 2015-09-21 LAB — CMP14+EGFR
A/G RATIO: 1.7 (ref 1.2–2.2)
ALBUMIN: 4.4 g/dL (ref 3.5–4.7)
ALK PHOS: 102 IU/L (ref 39–117)
ALT: 18 IU/L (ref 0–32)
AST: 23 IU/L (ref 0–40)
BILIRUBIN TOTAL: 0.4 mg/dL (ref 0.0–1.2)
BUN/Creatinine Ratio: 9 — ABNORMAL LOW (ref 12–28)
BUN: 8 mg/dL (ref 8–27)
CALCIUM: 9.7 mg/dL (ref 8.7–10.3)
CHLORIDE: 105 mmol/L (ref 96–106)
CO2: 25 mmol/L (ref 18–29)
Creatinine, Ser: 0.9 mg/dL (ref 0.57–1.00)
GFR calc Af Amer: 67 mL/min/{1.73_m2} (ref >59)
GFR calc non Af Amer: 58 mL/min/{1.73_m2} — ABNORMAL LOW (ref >59)
GLOBULIN, TOTAL: 2.6 g/dL (ref 1.5–4.5)
GLUCOSE: 87 mg/dL (ref 65–99)
POTASSIUM: 3.9 mmol/L (ref 3.5–5.2)
Sodium: 147 mmol/L — ABNORMAL HIGH (ref 134–144)
TOTAL PROTEIN: 7 g/dL (ref 6.0–8.5)

## 2015-09-21 LAB — CBC WITH DIFFERENTIAL/PLATELET
BASOS: 0 %
Basophils Absolute: 0 10*3/uL (ref 0.0–0.2)
EOS (ABSOLUTE): 0.1 10*3/uL (ref 0.0–0.4)
EOS: 2 %
HEMATOCRIT: 44.5 % (ref 34.0–46.6)
HEMOGLOBIN: 15 g/dL (ref 11.1–15.9)
Immature Grans (Abs): 0 10*3/uL (ref 0.0–0.1)
Immature Granulocytes: 0 %
LYMPHS ABS: 1.5 10*3/uL (ref 0.7–3.1)
Lymphs: 24 %
MCH: 30 pg (ref 26.6–33.0)
MCHC: 33.7 g/dL (ref 31.5–35.7)
MCV: 89 fL (ref 79–97)
MONOCYTES: 9 %
MONOS ABS: 0.6 10*3/uL (ref 0.1–0.9)
NEUTROS ABS: 4.1 10*3/uL (ref 1.4–7.0)
Neutrophils: 65 %
Platelets: 392 10*3/uL — ABNORMAL HIGH (ref 150–379)
RBC: 5 x10E6/uL (ref 3.77–5.28)
RDW: 13.5 % (ref 12.3–15.4)
WBC: 6.4 10*3/uL (ref 3.4–10.8)

## 2015-09-21 LAB — VITAMIN D 25 HYDROXY (VIT D DEFICIENCY, FRACTURES): VIT D 25 HYDROXY: 33.4 ng/mL (ref 30.0–100.0)

## 2015-10-04 ENCOUNTER — Other Ambulatory Visit: Payer: Self-pay | Admitting: Nurse Practitioner

## 2015-10-06 ENCOUNTER — Other Ambulatory Visit: Payer: Self-pay | Admitting: Pediatrics

## 2015-10-11 ENCOUNTER — Other Ambulatory Visit: Payer: Self-pay | Admitting: Nurse Practitioner

## 2015-10-11 ENCOUNTER — Other Ambulatory Visit: Payer: Self-pay | Admitting: Pediatrics

## 2015-10-22 ENCOUNTER — Ambulatory Visit (INDEPENDENT_AMBULATORY_CARE_PROVIDER_SITE_OTHER): Payer: Medicare Other | Admitting: Pediatrics

## 2015-10-22 VITALS — BP 118/76 | HR 79 | Temp 97.2°F | Ht 64.0 in | Wt 179.2 lb

## 2015-10-22 DIAGNOSIS — F329 Major depressive disorder, single episode, unspecified: Secondary | ICD-10-CM | POA: Diagnosis not present

## 2015-10-22 DIAGNOSIS — Z23 Encounter for immunization: Secondary | ICD-10-CM

## 2015-10-22 DIAGNOSIS — M545 Low back pain: Secondary | ICD-10-CM | POA: Diagnosis not present

## 2015-10-22 DIAGNOSIS — G8929 Other chronic pain: Secondary | ICD-10-CM | POA: Diagnosis not present

## 2015-10-22 DIAGNOSIS — M170 Bilateral primary osteoarthritis of knee: Secondary | ICD-10-CM | POA: Diagnosis not present

## 2015-10-22 DIAGNOSIS — F32A Depression, unspecified: Secondary | ICD-10-CM

## 2015-10-22 DIAGNOSIS — I10 Essential (primary) hypertension: Secondary | ICD-10-CM

## 2015-10-22 MED ORDER — TRAMADOL HCL 50 MG PO TABS: 25.0000 mg | ORAL_TABLET | Freq: Two times a day (BID) | ORAL | 0 refills | Status: DC | PRN

## 2015-10-22 NOTE — Progress Notes (Signed)
  Subjective:   Patient ID: [name], female    DOB: [birth year], 82 y.o.   MRN: [id number] CC: Follow-up back pain  HPI: BECKI MCCASKILL is a 82 y.o. female presenting for Follow-up  Ongoing back pain R side more painful R side feels weaker than other side because of knee replacement, stays very careful moving around Had been on hydrocodone briefly for back pain in the past Pt says it did help with pain Daughter says it made her sleep most of the time  Has a tray of medication that daughter prepares for her  Pt stays with her husband Daughter lives next door  Talked with daughter who lives next to her by phone Says she has had pain in abd, burns with urination last week Del is in charge of her medications because pt sometimes takes extra medications Daughter says pt is minimally active now Her son does most of the housework Says pt had less pain after the actonel, moved better Because of the pain had taken more than what was prescribed of gabapentin in the past is why daughter now is in charge of her medicine Leo Rod: [phone]  Relevant past medical, surgical, family and social history reviewed. Allergies and medications reviewed and updated. History  Smoking Status  . Former Smoker  . Packs/day: 1.00  . Years: 65.00  . Quit date: 10/26/2006  Smokeless Tobacco  . Never Used   ROS: Per HPI   Objective:    BP 118/76   Pulse 79   Temp 97.2 F (36.2 C) (Oral)   Ht '5\' 4"'$  (1.626 m)   Wt 179 lb 3.2 oz (81.3 kg)   BMI 30.76 kg/m   Wt Readings from Last 3 Encounters:  10/22/15 179 lb 3.2 oz (81.3 kg)  09/20/15 179 lb 8 oz (81.4 kg)  06/17/15 178 lb (80.7 kg)    Gen: NAD, alert EYES: EOMI, no conjunctival injection, or no icterus CV: NRRR, normal S1/S2, no murmur, distal pulses 2+ b/l Resp: CTABL, no wheezes, normal WOB Ext: No edema, warm Neuro: Alert and oriented MSK: no point tenderness over spine  Assessment & Plan:  Nazaria was seen today  for follow-up multiple med problems.  Diagnoses and all orders for this visit:  Low back pain H/o arthritis Had injections 10 yrs ago Ongoing pain Hydrocodone helped, but made pt sleepy per her daughter Discussed with pt and daughter, stronger pain medication can cause sleepiness and falls. Given pain making quality of life poor at this point will do trial of tramadol. Pt and daughter in agreement with plan Cont meloxicam Spoke with daughter Del by phone as well, Del keep all of pt's meds at her house, fills pill box. Says pt steady on her feet at home, will keep an eye on her, let me know if tramadol causing side effects Treat as below  Osteoarthritis of both knees, unspecified osteoarthritis type R knee pain more than L -     traMADol (ULTRAM) 50 MG tablet; Take 0.5-1 tablets (25-50 mg total) by mouth every 12 (twelve) hours as needed. Gave #10 tabs for use when pain significant RTC 4 weeks for follow up  Essential hypertension Well controlled today On '5mg'$  amlodipine  Depression On duloxetine, cont  Encounter for immunization -     Flu vaccine HIGH DOSE PF  Follow up plan: Return in about 4 weeks (around 11/19/2015) for med follow up. [name], MD [place]

## 2015-10-23 DIAGNOSIS — M545 Low back pain, unspecified: Secondary | ICD-10-CM | POA: Insufficient documentation

## 2015-10-28 ENCOUNTER — Other Ambulatory Visit: Payer: Self-pay | Admitting: Nurse Practitioner

## 2015-11-02 ENCOUNTER — Encounter: Payer: Self-pay | Admitting: Pediatrics

## 2015-11-02 ENCOUNTER — Ambulatory Visit (INDEPENDENT_AMBULATORY_CARE_PROVIDER_SITE_OTHER): Payer: Medicare Other | Admitting: Pediatrics

## 2015-11-02 VITALS — BP 120/72 | HR 99 | Temp 98.1°F | Ht 64.0 in | Wt 180.4 lb

## 2015-11-02 DIAGNOSIS — I10 Essential (primary) hypertension: Secondary | ICD-10-CM

## 2015-11-02 DIAGNOSIS — G8929 Other chronic pain: Secondary | ICD-10-CM | POA: Diagnosis not present

## 2015-11-02 DIAGNOSIS — R600 Localized edema: Secondary | ICD-10-CM | POA: Diagnosis not present

## 2015-11-02 DIAGNOSIS — M545 Low back pain: Secondary | ICD-10-CM | POA: Diagnosis not present

## 2015-11-02 MED ORDER — GABAPENTIN 300 MG PO CAPS: 300.0000 mg | ORAL_CAPSULE | Freq: Three times a day (TID) | ORAL | 2 refills | Status: DC

## 2015-11-02 NOTE — Progress Notes (Signed)
  Subjective:   Patient ID: [name], female    DOB: Aug 23, 1928, 82 y.o.   MRN: [id number] CC: Follow-up  HPI: [name] is a 82 y.o. female presenting for Follow-up  Tramadol gave her headaches, so stopped Gabapentin helps with pain in leg and arm Does make her sleepy No worsening of swelling Now cooking for her and husband several days a week  Has hot flashes, sweats at night sometimes, husband turns heat up high, she likes it cooler Normal stooling, no constipation Appetite is fine  Relevant past medical, surgical, family and social history reviewed. Allergies and medications reviewed and updated. History  Smoking Status  . Former Smoker  . Packs/day: 1.00  . Years: 65.00  . Quit date: 10/26/2006  Smokeless Tobacco  . Never Used   ROS: Per HPI   Objective:    BP 120/72   Pulse 99   Temp 98.1 F (36.7 C) (Oral)   Ht '5\' 4"'$  (1.626 m)   Wt 180 lb 6.4 oz (81.8 kg)   BMI 30.97 kg/m   Wt Readings from Last 3 Encounters:  11/02/15 180 lb 6.4 oz (81.8 kg)  10/22/15 179 lb 3.2 oz (81.3 kg)  09/20/15 179 lb 8 oz (81.4 kg)    Gen: NAD, alert, cooperative with exam, NCAT EYES: EOMI, no conjunctival injection, or no icterus ENT:  OP without erythema LYMPH: no cervical LAD CV: NRRR, normal S1/S2, no murmur, distal pulses 2+ b/l Resp: CTABL, no wheezes, normal WOB Abd: +BS, soft, NTND.  Ext: trace to 1+ pitting edema, warm Neuro: Alert and oriented  Assessment & Plan:  [name] was seen today for follow-up.  Diagnoses and all orders for this visit:  Chronic midline low back pain, with sciatica presence unspecified Cont rest, tylenol Increase gabapenitn to TID was on BID No narcotic or tramadol treatment of back pain -     gabapentin (NEURONTIN) 300 MG capsule; Take 1 capsule (300 mg total) by mouth 3 (three) times daily.  Essential hypertension Well controlled today, on amlodipine  Bilateral leg edema Compression hose, keep legs elevated No other  symptoms Let me know if not improving Pt is on amlodipine that can cause swelling Follow up plan: No Follow-up on file. [name], MD [place]

## 2015-11-10 ENCOUNTER — Other Ambulatory Visit: Payer: Self-pay | Admitting: Pediatrics

## 2015-11-18 ENCOUNTER — Other Ambulatory Visit: Payer: Self-pay | Admitting: Nurse Practitioner

## 2015-11-18 ENCOUNTER — Other Ambulatory Visit: Payer: Self-pay | Admitting: Pediatrics

## 2015-12-21 ENCOUNTER — Other Ambulatory Visit: Payer: Self-pay | Admitting: Pediatrics

## 2015-12-23 ENCOUNTER — Other Ambulatory Visit: Payer: Self-pay | Admitting: Pediatrics

## 2015-12-23 DIAGNOSIS — M545 Low back pain: Principal | ICD-10-CM

## 2015-12-23 DIAGNOSIS — G8929 Other chronic pain: Secondary | ICD-10-CM

## 2016-01-13 ENCOUNTER — Other Ambulatory Visit: Payer: Self-pay | Admitting: *Deleted

## 2016-01-13 DIAGNOSIS — G8929 Other chronic pain: Secondary | ICD-10-CM

## 2016-01-13 DIAGNOSIS — M545 Low back pain: Principal | ICD-10-CM

## 2016-01-13 MED ORDER — GABAPENTIN 300 MG PO CAPS: 300.0000 mg | ORAL_CAPSULE | Freq: Three times a day (TID) | ORAL | 0 refills | Status: DC

## 2016-01-13 MED ORDER — DULOXETINE HCL 20 MG PO CPEP: 20.0000 mg | ORAL_CAPSULE | Freq: Every day | ORAL | 1 refills | Status: DC

## 2016-01-13 MED ORDER — MECLIZINE HCL 25 MG PO TABS: ORAL_TABLET | ORAL | 0 refills | Status: DC

## 2016-01-14 ENCOUNTER — Other Ambulatory Visit: Payer: Self-pay | Admitting: *Deleted

## 2016-01-14 MED ORDER — MELOXICAM 7.5 MG PO TABS: ORAL_TABLET | ORAL | 0 refills | Status: DC

## 2016-04-13 ENCOUNTER — Other Ambulatory Visit: Payer: Self-pay | Admitting: Pediatrics

## 2016-04-13 DIAGNOSIS — M545 Low back pain: Principal | ICD-10-CM

## 2016-04-13 DIAGNOSIS — G8929 Other chronic pain: Secondary | ICD-10-CM

## 2016-04-19 ENCOUNTER — Other Ambulatory Visit: Payer: Self-pay | Admitting: Pediatrics

## 2016-04-19 ENCOUNTER — Telehealth: Payer: Self-pay | Admitting: Pediatrics

## 2016-04-19 MED ORDER — MELOXICAM 7.5 MG PO TABS: ORAL_TABLET | ORAL | 0 refills | Status: DC

## 2016-04-19 NOTE — Telephone Encounter (Signed)
Aware rx has been sent.

## 2016-04-19 NOTE — Telephone Encounter (Signed)
What is the name of the medication? meloxicam  Have you contacted your pharmacy to request a refill? Yes. She has appt with vincent on 4-25 and needs this filled until then.  Which pharmacy would you like this sent to? [facility]   Patient notified that their request is being sent to the clinical staff for review and that they should receive a call once it is complete. If they do not receive a call within 24 hours they can check with their pharmacy or our office.

## 2016-05-03 ENCOUNTER — Encounter: Payer: Self-pay | Admitting: Pediatrics

## 2016-05-03 ENCOUNTER — Encounter (INDEPENDENT_AMBULATORY_CARE_PROVIDER_SITE_OTHER): Payer: Self-pay

## 2016-05-03 ENCOUNTER — Ambulatory Visit (INDEPENDENT_AMBULATORY_CARE_PROVIDER_SITE_OTHER): Payer: Medicare Other | Admitting: Pediatrics

## 2016-05-03 VITALS — BP 137/89 | HR 76 | Temp 97.2°F | Ht 64.0 in | Wt 180.0 lb

## 2016-05-03 DIAGNOSIS — I10 Essential (primary) hypertension: Secondary | ICD-10-CM | POA: Diagnosis not present

## 2016-05-03 DIAGNOSIS — G8929 Other chronic pain: Secondary | ICD-10-CM | POA: Diagnosis not present

## 2016-05-03 MED ORDER — DULOXETINE HCL 20 MG PO CPEP: 40.0000 mg | ORAL_CAPSULE | Freq: Every day | ORAL | 1 refills | Status: DC

## 2016-05-03 NOTE — Progress Notes (Signed)
  Subjective:   Patient ID: [name], female    DOB: 1928/09/15, 83 y.o.   MRN: [id number] CC: Follow-up and Joint Pain  HPI: [name] is a 83 y.o. female presenting for Follow-up and Joint Pain  Hips and lower back continue to bother her Also says she has pains all over, arms, legs at times Here today with daughter No recent falls Appetite has been ok No fevers Using aspercream  Mood has been down, she thinks due to pain  Relevant past medical, surgical, family and social history reviewed. Allergies and medications reviewed and updated. History  Smoking Status  . Former Smoker  . Packs/day: 1.00  . Years: 65.00  . Quit date: 10/26/2006  Smokeless Tobacco  . Never Used   ROS: Per HPI   Objective:    BP 137/89   Pulse 76   Temp 97.2 F (36.2 C) (Oral)   Ht '5\' 4"'$  (1.626 m)   Wt 180 lb (81.6 kg)   BMI 30.90 kg/m   Wt Readings from Last 3 Encounters:  05/03/16 180 lb (81.6 kg)  11/02/15 180 lb 6.4 oz (81.8 kg)  10/22/15 179 lb 3.2 oz (81.3 kg)    Gen: NAD, alert, cooperative with exam, NCAT EYES: EOMI, no conjunctival injection, or no icterus ENT:  OP without erythema LYMPH: no cervical LAD CV: NRRR, normal S1/S2, no murmur, distal pulses 2+ b/l Resp: CTABL, no wheezes, normal WOB Ext: No edema, warm Neuro: Alert and oriented, strength equal b/l UE and LE MSK: normal muscle bulk, able to raise arms b/l over her shoulders, ttp lower back midline and paraspinal muscles, no swelling hands, wrists, elbows  Assessment & Plan:  [name] was seen today for follow-up and joint pain.  Diagnoses and all orders for this visit:  Other chronic pain Ongoing pain lower back, hip from OA No one joint bothering her, multiple areas no swelling or redness Using aspercream, tylenol Thinks gabapentin may be helping some cont Will start below, any worsening let me know Take '20mg'$  for 2 weeks then increase to 40 mg -     DULoxetine (CYMBALTA) 20 MG capsule; Take  2 capsules (40 mg total) by mouth daily.  Essential hypertension Adequate control Cont to follow No LE swelling today  Follow up plan: 3 mo, sooner if needed Amy Found, MD [place]

## 2016-05-09 ENCOUNTER — Other Ambulatory Visit: Payer: Self-pay | Admitting: Pediatrics

## 2016-07-11 ENCOUNTER — Other Ambulatory Visit: Payer: Self-pay | Admitting: Pediatrics

## 2016-07-11 DIAGNOSIS — G8929 Other chronic pain: Secondary | ICD-10-CM

## 2016-07-11 DIAGNOSIS — M545 Low back pain: Principal | ICD-10-CM

## 2016-07-20 ENCOUNTER — Other Ambulatory Visit: Payer: Self-pay | Admitting: Pediatrics

## 2016-08-03 ENCOUNTER — Other Ambulatory Visit: Payer: Self-pay | Admitting: Pediatrics

## 2016-08-25 ENCOUNTER — Other Ambulatory Visit: Payer: Self-pay | Admitting: Pharmacist

## 2016-09-21 ENCOUNTER — Ambulatory Visit: Payer: Medicare Other | Admitting: Pediatrics

## 2016-10-06 ENCOUNTER — Ambulatory Visit (INDEPENDENT_AMBULATORY_CARE_PROVIDER_SITE_OTHER): Payer: Medicare Other | Admitting: Pediatrics

## 2016-10-06 ENCOUNTER — Encounter: Payer: Self-pay | Admitting: Pediatrics

## 2016-10-06 VITALS — BP 129/70 | HR 79 | Temp 97.7°F | Ht 64.0 in | Wt 179.4 lb

## 2016-10-06 DIAGNOSIS — L989 Disorder of the skin and subcutaneous tissue, unspecified: Secondary | ICD-10-CM | POA: Diagnosis not present

## 2016-10-06 DIAGNOSIS — I10 Essential (primary) hypertension: Secondary | ICD-10-CM

## 2016-10-06 DIAGNOSIS — M8580 Other specified disorders of bone density and structure, unspecified site: Secondary | ICD-10-CM | POA: Diagnosis not present

## 2016-10-06 DIAGNOSIS — M170 Bilateral primary osteoarthritis of knee: Secondary | ICD-10-CM

## 2016-10-06 DIAGNOSIS — M545 Low back pain: Secondary | ICD-10-CM | POA: Diagnosis not present

## 2016-10-06 DIAGNOSIS — F329 Major depressive disorder, single episode, unspecified: Secondary | ICD-10-CM

## 2016-10-06 DIAGNOSIS — G8929 Other chronic pain: Secondary | ICD-10-CM

## 2016-10-06 DIAGNOSIS — R51 Headache: Secondary | ICD-10-CM

## 2016-10-06 DIAGNOSIS — B078 Other viral warts: Secondary | ICD-10-CM | POA: Diagnosis not present

## 2016-10-06 DIAGNOSIS — R519 Headache, unspecified: Secondary | ICD-10-CM

## 2016-10-06 DIAGNOSIS — F32A Depression, unspecified: Secondary | ICD-10-CM

## 2016-10-06 MED ORDER — DULOXETINE HCL 20 MG PO CPEP: 40.0000 mg | ORAL_CAPSULE | Freq: Every day | ORAL | 1 refills | Status: DC

## 2016-10-06 NOTE — Progress Notes (Signed)
Subjective:   Patient ID: [name], female    DOB: 06/11/1928, 83 y.o.   MRN: [id number] CC: Follow-up; Headache; and Nevus  HPI: [name] is a 83 y.o. female presenting for Follow-up; Headache; and Nevus  Here today with daughter who lives next door Pt minimally active Daughter says when son comes to visit pt is much more engaged, active, moving around house Pt says at baseline she vacuums 1-2 times a week, rarely cooks, will come meet daughter next door for dinner at times Daughter helps to keep her medicine boxes filled  Has a headache throughout head/neck that comes and goes for past week No nausea, no light sensitivity Doing her puzzle book helps distract her and pain eases Pt thinks it is related to stress Hurts all over, not on one place  Has chronic pain from arthritis in b/l knees  No CP, no SOB, takes amlodipine daily  Has scaly mole L side of her neck that she picks at and has been bothering her  Relevant past medical, surgical, family and social history reviewed. Allergies and medications reviewed and updated. History  Smoking Status  . Former Smoker  . Packs/day: 1.00  . Years: 65.00  . Quit date: 10/26/2006  Smokeless Tobacco  . Never Used   ROS: Per HPI   Objective:    BP 129/70   Pulse 79   Temp 97.7 F (36.5 C) (Oral)   Ht 5' 4" (1.626 m)   Wt 179 lb 6.4 oz (81.4 kg)   BMI 30.79 kg/m   Wt Readings from Last 3 Encounters:  10/06/16 179 lb 6.4 oz (81.4 kg)  05/03/16 180 lb (81.6 kg)  11/02/15 180 lb 6.4 oz (81.8 kg)    Gen: NAD, alert, cooperative with exam, NCAT EYES: EOMI, no conjunctival injection, or no icterus ENT:  TMs pearly gray b/l, OP without erythema LYMPH: no cervical LAD CV: NRRR, normal S1/S2 Resp: CTABL, no wheezes, normal WOB Abd: +BS, soft, NTND. Ext: No edema, warm Neuro: Alert and oriented, strength equal b/l UE and LE, coordination grossly normal MSK: 180 to apprx 30 degrees flexion b/l knees, no pain  with flexion No redness, swelling in b/l knee joints Skin: 65m hyperpigmented papule with rough top  Assessment & Plan:  WMariadejesuswas seen today for follow-up, headache and nevus.  Diagnoses and all orders for this visit:  Essential hypertension Stable, cont below -     BMP8+EGFR  Other chronic pain OK to take tylenol 5071mTID Increase to 409mf below Cont to encourage gentle regular exercise -     DULoxetine (CYMBALTA) 20 MG capsule; Take 2 capsules (40 mg total) by mouth daily.  Osteopenia, unspecified location Cont bisphosphonate  Osteoarthritis of both knees, unspecified osteoarthritis type Cont tylenol, aspercream  Chronic midline low back pain, with sciatica presence unspecified Tylenol, aspercream, heat, gentle back stretches  Depression, unspecified depression type Stable, on SNRI as above, increasing  Nonintractable headache, unspecified chronicity pattern, unspecified headache type Cont stress relief, seems to help with HA If worsening let me know  Skin lesion Options discussed, pt and daughter wanted to proceed with biopsy -     Pathology  After risks and benefits and procedure itself discussed in detail, pt agreed to proceed with biopsy of papule L neck. Area was thoroughly cleaned and prepped with betadine. 0.3mL37m 1% lidocaine was injected under the the lesion. Using a dermablade, the lesion was removed, hemostasis achieved with silver nitrate stick x 2. Wound  dressed, wound care discussed with pt.   Follow up plan: 6 mo [name], MD [place]

## 2016-10-06 NOTE — Patient Instructions (Signed)
Keep biopsy site covered with antibacterial ointment and bandaid. Change dressing twice a day.

## 2016-10-07 LAB — BMP8+EGFR
BUN / CREAT RATIO: 9 — AB (ref 12–28)
BUN: 8 mg/dL (ref 8–27)
CHLORIDE: 103 mmol/L (ref 96–106)
CO2: 27 mmol/L (ref 20–29)
CREATININE: 0.92 mg/dL (ref 0.57–1.00)
Calcium: 9.7 mg/dL (ref 8.7–10.3)
GFR calc Af Amer: 65 mL/min/{1.73_m2} (ref >59)
GFR calc non Af Amer: 57 mL/min/{1.73_m2} — ABNORMAL LOW (ref >59)
GLUCOSE: 84 mg/dL (ref 65–99)
Potassium: 4.8 mmol/L (ref 3.5–5.2)
Sodium: 144 mmol/L (ref 134–144)

## 2016-10-11 LAB — PATHOLOGY

## 2016-10-12 ENCOUNTER — Other Ambulatory Visit: Payer: Self-pay | Admitting: Pediatrics

## 2016-10-12 DIAGNOSIS — M545 Low back pain: Principal | ICD-10-CM

## 2016-10-12 DIAGNOSIS — G8929 Other chronic pain: Secondary | ICD-10-CM

## 2016-11-02 ENCOUNTER — Other Ambulatory Visit: Payer: Self-pay | Admitting: Pediatrics

## 2016-11-05 IMAGING — CT CT ABD-PELV W/ CM
2 of 5 series · 15 of 46 positions shown, 17 images · IV contrast (omnipaque)
Comparison: 09/02/2009 CT abdomen/pelvis.  11/14/2010 MRI abdomen.

CLINICAL DATA: Worsening right upper quadrant abdominal/ chest pain
for 1 month.

EXAM:
CT ABDOMEN AND PELVIS WITH CONTRAST
TECHNIQUE: Multidetector CT imaging of the abdomen and pelvis was performed
using the standard protocol following bolus administration of
intravenous contrast.
CONTRAST:  25mL OMNIPAQUE IOHEXOL 300 MG/ML SOLN, 100mL OMNIPAQUE
IOHEXOL 300 MG/ML SOLN

[Series 2: abd_pel_with 5.0 b40f · axial · 0.85mm/px · z∈[-378,+12]mm · 12 of 90 slices shown, 14 images]
[im 6/90  soft-tissue]
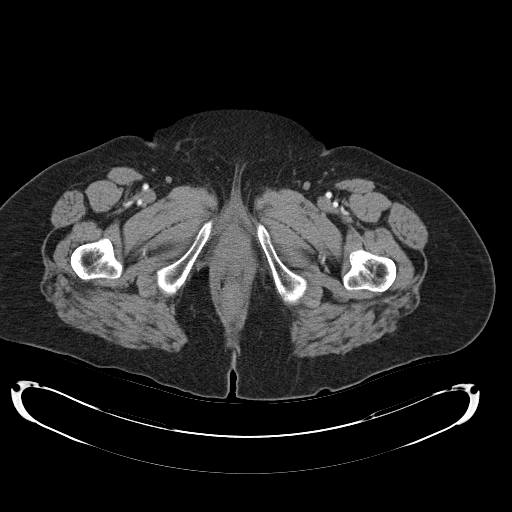
[im 6/90  bone]
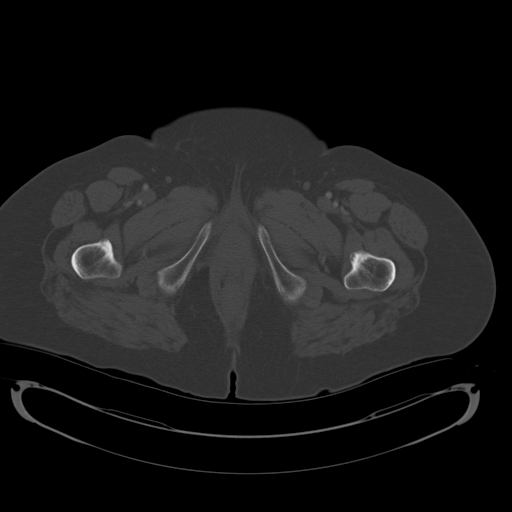
[im 16/90  soft-tissue]
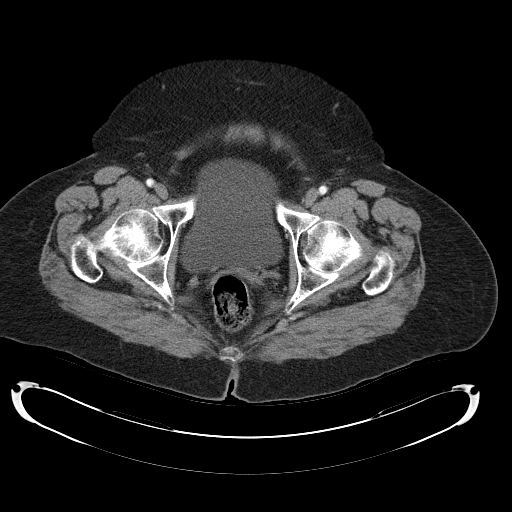
[im 21/90  soft-tissue]
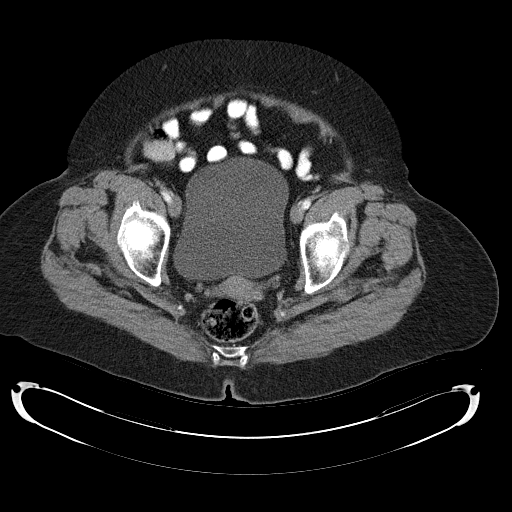
[im 27/90  soft-tissue]
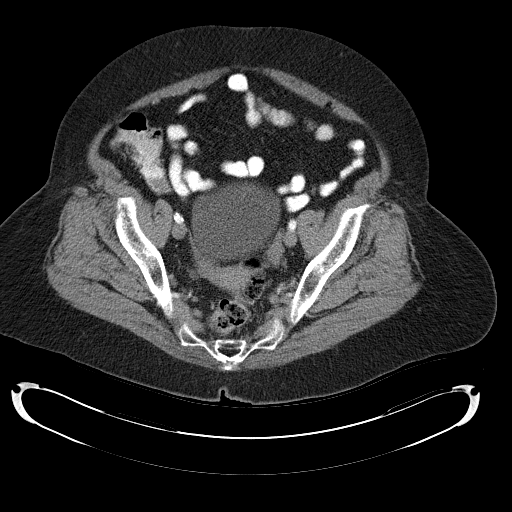
[im 37/90  soft-tissue]
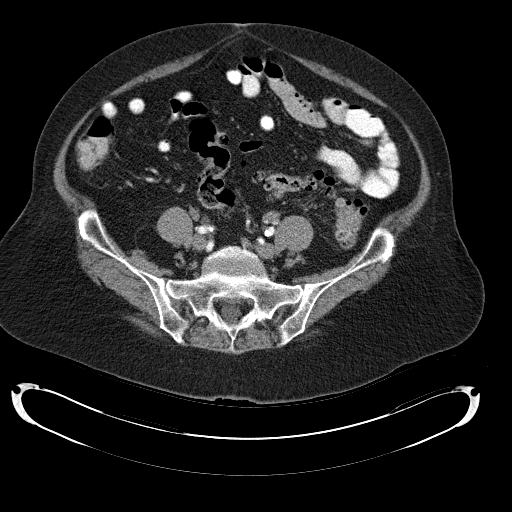
[im 42/90  soft-tissue]
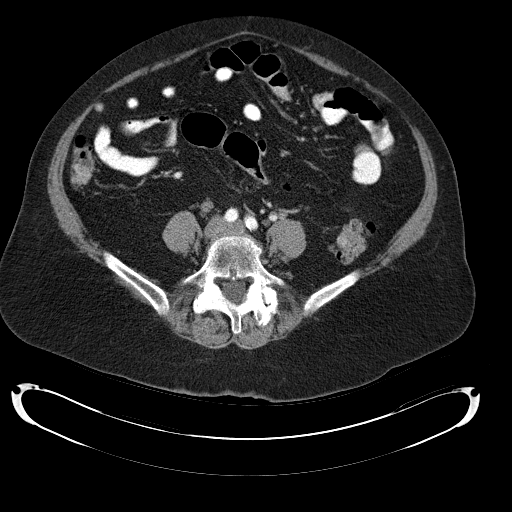
[im 48/90  soft-tissue]
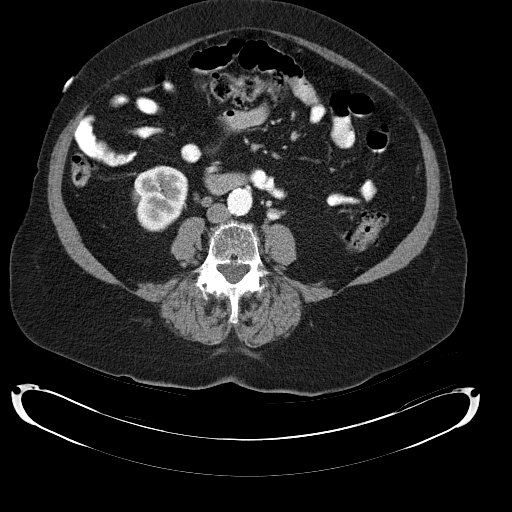
[im 58/90  soft-tissue]
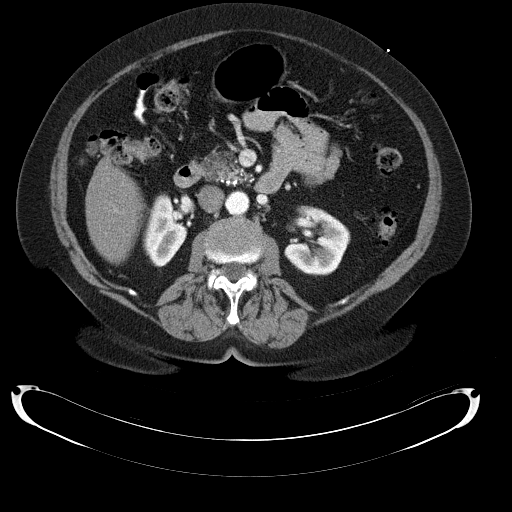
[im 63/90  soft-tissue]
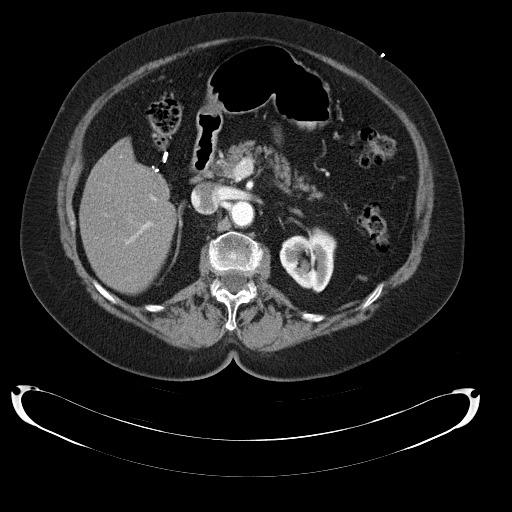
[im 63/90  bone]
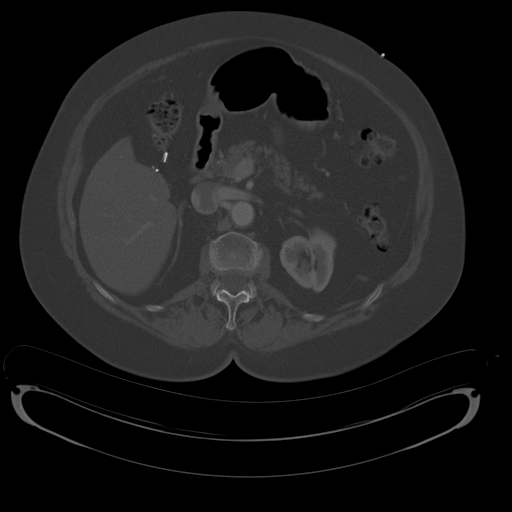
[im 69/90  soft-tissue]
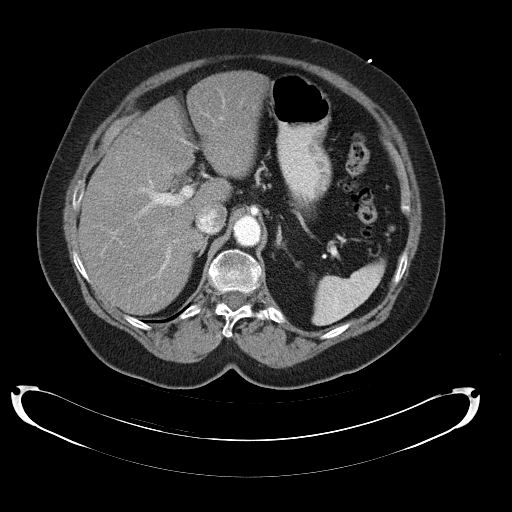
[im 79/90  soft-tissue]
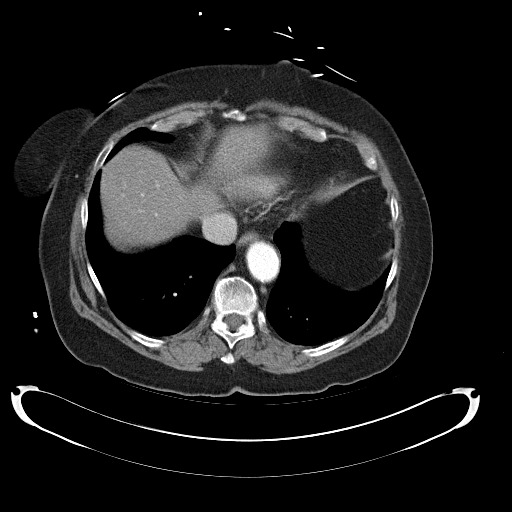
[im 84/90  soft-tissue]
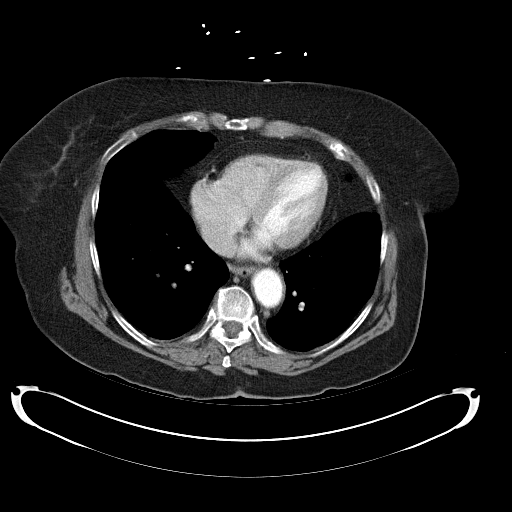

[Series 3: abd_pel_with 3.0 spo cor · coronal · 0.72mm/px · 3 of 112 slices shown]
[im 38/112  soft-tissue]
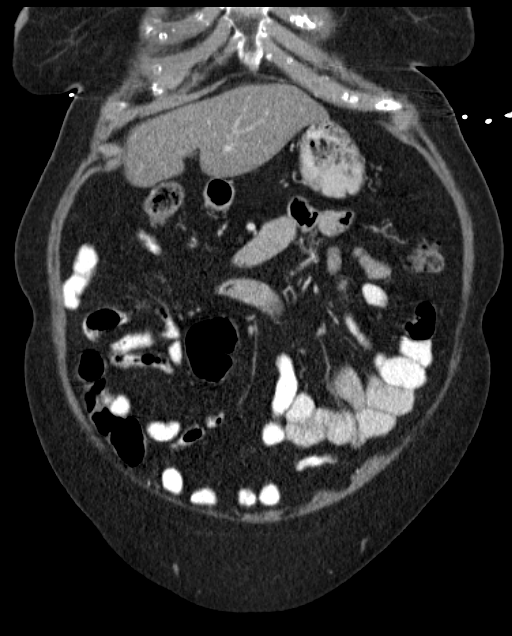
[im 50/112  soft-tissue]
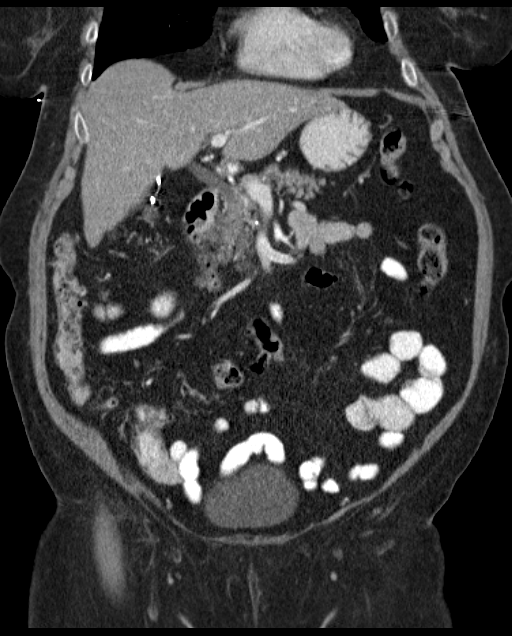
[im 62/112  soft-tissue]
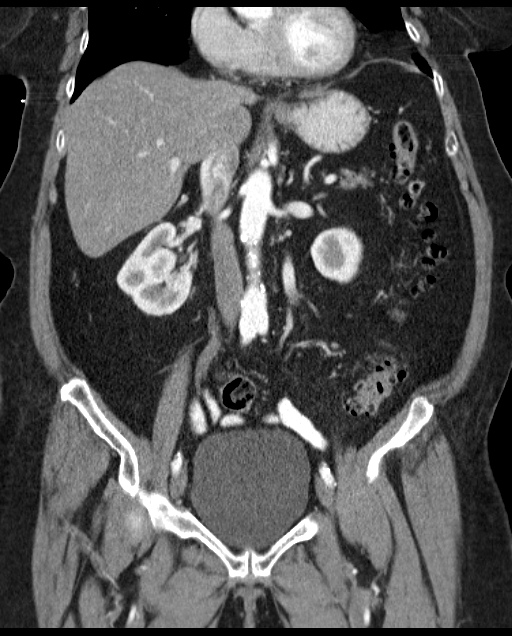

[15 of 46 positions shown; findings below may reference images not displayed]

FINDINGS: Lower chest: Partially visualized 5 mm ground-glass right lower lobe
pulmonary nodule (series 6/image 1), likely stable since 8866.

Hepatobiliary: Diffuse hepatic steatosis. No liver mass. Status post
cholecystectomy. No intrahepatic biliary ductal dilatation. Stable
dilated common bile duct (8 mm diameter), within expected post
cholecystectomy limits.

Pancreas: Stable calcifications throughout the pancreatic head, in
keeping with chronic pancreatitis. No peripancreatic fat stranding.
No main pancreatic duct dilation. No pancreatic mass.

Spleen: Normal size. No mass.

Adrenals/Urinary Tract: Stable 2.8 cm right adrenal adenoma,
unchanged since 8866. Normal left adrenal. No hydronephrosis. Simple
1.2 cm renal cyst in the posterior lower right kidney. Additional
subcentimeter hypodense lesions in both kidneys are too small to
characterize. Normal bladder.

Stomach/Bowel: Grossly normal stomach. Normal caliber small bowel
with no small bowel wall thickening. Appendix is not discretely
visualized. There is marked diffuse colonic diverticulosis, most
prominent in the descending and sigmoid colon. No colonic wall
thickening. Mild pericolonic fat stranding at the junction of the
descending and sigmoid colon appears unchanged since 8866.

Vascular/Lymphatic: Atherosclerotic nonaneurysmal abdominal aorta.
Patent portal, splenic, hepatic and renal veins. No pathologically
enlarged lymph nodes in the abdomen or pelvis.

Reproductive: Grossly normal uterus.  No adnexal mass.

Other: No pneumoperitoneum, ascites or focal fluid collection.

Musculoskeletal: No aggressive appearing focal osseous lesions. Mild
degenerative changes in the thoracolumbar spine.
IMPRESSION: 1. No acute abnormality. Marked colonic diverticulosis, with no
convincing evidence of acute diverticulitis. No evidence of bowel
obstruction or acute bowel inflammation.
2. Status post cholecystectomy. Bile ducts are stable and within
expected post cholecystectomy limits.
3. Stable findings of chronic pancreatitis in the pancreatic head.
No evidence of acute pancreatitis.
4. Diffuse hepatic steatosis.
5. Stable right adrenal adenoma.

## 2016-11-17 ENCOUNTER — Encounter: Payer: Self-pay | Admitting: Family Medicine

## 2016-11-17 ENCOUNTER — Ambulatory Visit (INDEPENDENT_AMBULATORY_CARE_PROVIDER_SITE_OTHER): Payer: Medicare Other | Admitting: Family Medicine

## 2016-11-17 VITALS — BP 131/80 | HR 87 | Temp 97.1°F | Ht 64.0 in | Wt 178.0 lb

## 2016-11-17 DIAGNOSIS — R109 Unspecified abdominal pain: Secondary | ICD-10-CM | POA: Diagnosis not present

## 2016-11-17 DIAGNOSIS — N3000 Acute cystitis without hematuria: Secondary | ICD-10-CM

## 2016-11-17 DIAGNOSIS — R300 Dysuria: Secondary | ICD-10-CM | POA: Diagnosis not present

## 2016-11-17 LAB — URINALYSIS, COMPLETE
BILIRUBIN UA: NEGATIVE
Glucose, UA: NEGATIVE
KETONES UA: NEGATIVE
Nitrite, UA: NEGATIVE
PROTEIN UA: NEGATIVE
Urobilinogen, Ur: 0.2 mg/dL (ref 0.2–1.0)
pH, UA: 6 (ref 5.0–7.5)

## 2016-11-17 LAB — MICROSCOPIC EXAMINATION: RENAL EPITHEL UA: NONE SEEN /HPF

## 2016-11-17 MED ORDER — CEPHALEXIN 500 MG PO CAPS: 500.0000 mg | ORAL_CAPSULE | Freq: Two times a day (BID) | ORAL | 0 refills | Status: DC

## 2016-11-17 MED ORDER — CEFTRIAXONE SODIUM 1 G IJ SOLR
1.0000 g | Freq: Once | INTRAMUSCULAR | Status: AC
  Administered 2016-11-17: 1 g via INTRAMUSCULAR

## 2016-11-17 NOTE — Progress Notes (Signed)
Subjective: CC: Urinary symptoms PCP: Eustaquio Maize, MD [name] is a 83 y.o. female, who is accompanied by her son and daughter to visit. She is presenting to clinic today for:  1. Urinary symptoms Patient reports a 1 week h/o right-sided flank pain, lower abdominal pain, bladder pressure, increased frequency.  This morning she thought that her urine has an odor to it and therefore scheduled an appointment to evaluate for possible UTI.  She denies fevers, chills, nausea, vomiting, hematuria, dysuria or urgency.  No history of frequent or recurrent UTIs.  No abnormal vaginal discharge or bleeding.  She does have a history of low back pain but she notes that this pain is different from that.  She has been hydrating well with water but no other therapies tried.    Allergies  Allergen Reactions  . Avelox [Moxifloxacin Hcl In Nacl]   . Codeine Nausea And Vomiting   Past Medical History:  Diagnosis Date  . Acute diverticulitis   . Anemia   . Anxiety   . AVM (arteriovenous malformation) of colon   . Cataract   . Depression   . Diverticulosis 10/2009   Pandiverticulosis, per colonoscopy, Dr. Oneida Alar  . DJD (degenerative joint disease)   . Emphysema   . GERD (gastroesophageal reflux disease)   . Hypertension   . Internal hemorrhoids   . Rectal bleeding    Secondary to diverticulosis, hemorrhoids, and/or cecal AVMs  . Wrist fracture    Family History  Problem Relation Age of Onset  . Asthma Mother   . Cancer Sister        stomach  . Heart attack Brother   . Epilepsy Brother   . Colon cancer Neg Hx     Current Outpatient Medications:  .  acetaminophen (TYLENOL) 500 MG tablet, Take 1-2 tablets (500-1,000 mg total) by mouth daily., Disp: 30 tablet, Rfl: 0 .  amLODipine (NORVASC) 5 MG tablet, TAKE 1 TABLET BY MOUTH DAILY, Disp: 30 tablet, Rfl: 4 .  cholecalciferol (VITAMIN D) 1000 units tablet, Take 1,000 Units by mouth daily., Disp: , Rfl:  .  DULoxetine  (CYMBALTA) 20 MG capsule, Take 2 capsules (40 mg total) by mouth daily., Disp: 180 capsule, Rfl: 1 .  gabapentin (NEURONTIN) 300 MG capsule, TAKE 1 CAPSULE BY MOUTH THREE TIMES A DAY, Disp: 270 capsule, Rfl: 1 .  meclizine (ANTIVERT) 25 MG tablet, TAKE 1 TABLET BY MOUTH TWICE A DAY, Disp: 180 tablet, Rfl: 1 .  PROAIR HFA 108 (90 Base) MCG/ACT inhaler, INHALE 2 PUFFS INTO THE LUNGS EVERY 6 (SIX) HOURS AS NEEDED FOR WHEEZING OR SHORTNESS OF BREATH., Disp: 8.5 Inhaler, Rfl: 3 .  risedronate (ACTONEL) 150 MG tablet, TAKE 1 TABLET EVERY 30 DAYS WITH WATER ON EMPTY STOMACH,DO NOT EAT,DRINK OR LIE DOWN FOR NEXT30 MINS, Disp: 3 tablet, Rfl: 3 .  cephALEXin (KEFLEX) 500 MG capsule, Take 1 capsule (500 mg total) 2 (two) times daily by mouth. x10 days, Disp: 20 capsule, Rfl: 0  Current Facility-Administered Medications:  .  cefTRIAXone (ROCEPHIN) injection 1 g, 1 g, Intramuscular, Once, Ajee Heasley M, DO  Social Hx: non smoker.  Health Maintenance:  Flu shot  ROS: Per HPI  Objective: Office vital signs reviewed. BP 131/80   Pulse 87   Temp (!) 97.1 F (36.2 C) (Oral)   Ht 5\' 4"  (1.626 m)   Wt 178 lb (80.7 kg)   BMI 30.55 kg/m   Physical Examination:  General: Awake, alert, well nourished, well  appearing elderly female, No acute distress HEENT: Normal, MMM, sclera white Cardio: regular rate and rhythm, S1S2 heard, no murmurs appreciated Pulm: clear to auscultation bilaterally, no wheezes, rhonchi or rales; normal work of breathing on room air GI: soft, non-tender, non-distended, bowel sounds present x4, no hepatomegaly, no splenomegaly, no masses GU: + Suprapubic tenderness, mild right sided CVA tenderness Neuro: Follows all commands, engages in conversation appropriately, no focal deficits  Assessment/ Plan: 83 y.o. female   1. Acute cystitis without hematuria Urinalysis with trace blood and an 1+ leukocytes.  Microscopic exam significant for 6-10 white blood cells, 0-2 red blood  cells, few bacteria.  Because patient is symptomatic and demonstrating right-sided flank pain that is acute, I am treating for urinary tract infection and possible pyelonephritis with extended antibiotic therapy.  She will take Keflex by mouth twice a day for the next 10 days.  2 g/day dosing was considered in this geriatric patient but her creatinine clearance is 56 mL/min and max dose recommendation is 1 g/day.  Urine culture was added.  Will contact patient with results.  No recent antibiotic use.  Encourage p.o. Fluids. Strict return precautions and reasons for emergent evaluation in the emergency department review with patient.  They voiced understanding and will follow-up as needed. - Urinalysis, Complete - cefTRIAXone (ROCEPHIN) injection 1 g - Basic Metabolic Panel - CBC with Differential - Urine Culture  2. Acute right flank pain Patient has known low back pain.  Right flank pain is somewhat lower than expected but given evidence for UTI, will cover for possible pyelonephritis.  BMP and CBC with differential were also collected today.  Will contact patient with results. - cefTRIAXone (ROCEPHIN) injection 1 g - Basic Metabolic Panel - CBC with Differential   Orders Placed This Encounter  Procedures  . Urine Culture  . Urinalysis, Complete  . Basic Metabolic Panel  . CBC with Differential   Meds ordered this encounter  Medications  . cefTRIAXone (ROCEPHIN) injection 1 g  . cephALEXin (KEFLEX) 500 MG capsule    Sig: Take 1 capsule (500 mg total) 2 (two) times daily by mouth. x10 days    Dispense:  20 capsule    Refill:  [place], DO [facility] [phone]

## 2016-11-17 NOTE — Patient Instructions (Signed)
You were giving her first dose of antibiotics here in office.  You will take cephalexin twice a day for the next 10 days to cover for possible kidney infection.  I have ordered blood testing to look at your kidney function and to look for signs of severe infection.  Continue to drink plenty of water.  If your symptoms worsen, you develop fever, severe abdominal pain, nausea, vomiting, fevers, worsening back pain, please seek immediate medical attention.   Urinary Tract Infection, Adult A urinary tract infection (UTI) is an infection of any part of the urinary tract. The urinary tract includes the:  Kidneys.  Ureters.  Bladder.  Urethra.  These organs make, store, and get rid of pee (urine) in the body. Follow these instructions at home:  Take over-the-counter and prescription medicines only as told by your doctor.  If you were prescribed an antibiotic medicine, take it as told by your doctor. Do not stop taking the antibiotic even if you start to feel better.  Avoid the following drinks: ? Alcohol. ? Caffeine. ? Tea. ? Carbonated drinks.  Drink enough fluid to keep your pee clear or pale yellow.  Keep all follow-up visits as told by your doctor. This is important.  Make sure to: ? Empty your bladder often and completely. Do not to hold pee for long periods of time. ? Empty your bladder before and after sex. ? Wipe from front to back after a bowel movement if you are female. Use each tissue one time when you wipe. Contact a doctor if:  You have back pain.  You have a fever.  You feel sick to your stomach (nauseous).  You throw up (vomit).  Your symptoms do not get better after 3 days.  Your symptoms go away and then come back. Get help right away if:  You have very bad back pain.  You have very bad lower belly (abdominal) pain.  You are throwing up and cannot keep down any medicines or water. This information is not intended to replace advice given to you by your  health care provider. Make sure you discuss any questions you have with your health care provider. Document Released: 06/14/2007 Document Revised: 06/03/2015 Document Reviewed: 11/16/2014 Elsevier Interactive Patient Education  [employer].

## 2016-11-19 LAB — URINE CULTURE

## 2017-01-27 ENCOUNTER — Encounter: Payer: Self-pay | Admitting: Family Medicine

## 2017-01-27 ENCOUNTER — Ambulatory Visit (INDEPENDENT_AMBULATORY_CARE_PROVIDER_SITE_OTHER): Payer: Medicare Other | Admitting: Family Medicine

## 2017-01-27 VITALS — BP 125/79 | HR 85 | Temp 97.6°F | Ht 64.0 in | Wt 181.0 lb

## 2017-01-27 DIAGNOSIS — J439 Emphysema, unspecified: Secondary | ICD-10-CM

## 2017-01-27 DIAGNOSIS — M159 Polyosteoarthritis, unspecified: Secondary | ICD-10-CM

## 2017-01-27 DIAGNOSIS — R0789 Other chest pain: Secondary | ICD-10-CM | POA: Diagnosis not present

## 2017-01-27 MED ORDER — METHYLPREDNISOLONE ACETATE 80 MG/ML IJ SUSP
80.0000 mg | Freq: Once | INTRAMUSCULAR | Status: AC
  Administered 2017-01-27: 80 mg via INTRAMUSCULAR

## 2017-01-27 MED ORDER — AZITHROMYCIN 250 MG PO TABS: ORAL_TABLET | ORAL | 0 refills | Status: DC

## 2017-01-27 NOTE — Progress Notes (Signed)
Subjective:  Patient ID: [name], female    DOB: Sep 02, 1928  Age: 84 y.o. MRN: [id number]  CC: Shortness of Breath (chest tight, HA ); left under arm pain; and Stress   HPI [name] presents for having a great deal of arthritis discomfort all over.  The pain involves primarily the left axilla particularly with movement of the arm.  It also radiates from the right lower back through the right hip and into the right thigh and down to the knee.  She has some pain in the knee from previous knee replacement on the right.  Pain is described as moderately severe.  No number between 1 and 10 given.  Patient says that it makes her unsteady at times but she has figured out a way need to hold onto things that she walks about her house to avoid having to use her walker.  Additionally she has some chest tightness.  This is not episodic.  It occurred yesterday evening and this morning.  It is a nonradiating sensation however she notices that she has some pain on the left jaw as well with chewing and speech.  There is no radiation into the shoulder but she does have the pain under left arm.  These are not necessarily related temporally.  Pain is not a heaviness or pressure but an ache.  It is not related to exertion per se.  Depression screen [facility] 2/9 01/27/2017 11/17/2016 10/06/2016  Decreased Interest 0 3 3  Down, Depressed, Hopeless 1 3 2   PHQ - 2 Score 1 6 5   Altered sleeping - 2 1  Tired, decreased energy - 2 3  Change in appetite - 2 0  Feeling bad or failure about yourself  - 2 0  Trouble concentrating - 1 0  Moving slowly or fidgety/restless - 1 2  Suicidal thoughts - 0 0  PHQ-9 Score - 16 11  Difficult doing work/chores - - Somewhat difficult    History [name] has a past medical history of Acute diverticulitis, Anemia, Anxiety, AVM (arteriovenous malformation) of colon, Cataract, Depression, Diverticulosis (10/2009), DJD (degenerative joint disease), Emphysema, GERD  (gastroesophageal reflux disease), Hypertension, Internal hemorrhoids, Rectal bleeding, and Wrist fracture.   She has a past surgical history that includes Right total knee arthroplasty; Colonoscopy (Oct 2011); Esophagogastroduodenoscopy (Left, 08/10/2012); and Cataract extraction (Bilateral, 2017).   Her family history includes Asthma in her mother; Cancer in her sister; Epilepsy in her brother; Heart attack in her brother.She reports that she quit smoking about 10 years ago. She has a 65.00 pack-year smoking history. she has never used smokeless tobacco. She reports that she does not drink alcohol or use drugs.    ROS Review of Systems  Constitutional: Negative for activity change, appetite change and fever.  HENT: Negative for congestion, rhinorrhea and sore throat.   Eyes: Negative for visual disturbance.  Respiratory: Positive for chest tightness and shortness of breath (She has this all the time it is not particularly worse than baseline currently). Negative for cough.   Cardiovascular: Negative for chest pain and palpitations.  Gastrointestinal: Negative for abdominal pain, diarrhea and nausea.  Genitourinary: Negative for dysuria.  Musculoskeletal: Positive for arthralgias and myalgias.    Objective:  BP 125/79 (BP Location: Left Arm)   Pulse 85   Temp 97.6 F (36.4 C) (Oral)   Ht 5\' 4"  (1.626 m)   Wt 181 lb (82.1 kg)   SpO2 97%   BMI 31.07 kg/m   BP Readings from  Last 3 Encounters:  01/27/17 125/79  11/17/16 131/80  10/06/16 129/70    Wt Readings from Last 3 Encounters:  01/27/17 181 lb (82.1 kg)  11/17/16 178 lb (80.7 kg)  10/06/16 179 lb 6.4 oz (81.4 kg)     Physical Exam  Constitutional: She is oriented to person, place, and time. No distress.  Elderly, stooped  HENT:  Head: Normocephalic and atraumatic.  Right Ear: External ear normal.  Left Ear: External ear normal.  Nose: Nose normal.  Mouth/Throat: Oropharynx is clear and moist.  Eyes: Conjunctivae  and EOM are normal. Pupils are equal, round, and reactive to light.  Neck: Normal range of motion. Neck supple. No thyromegaly present.  Cardiovascular: Normal rate, regular rhythm and normal heart sounds.  No murmur heard. Pulmonary/Chest: Effort normal. No respiratory distress. She has wheezes (Occasional rhonchus). She has no rales.  Abdominal: Soft. Bowel sounds are normal. She exhibits no distension. There is no tenderness.  Musculoskeletal: She exhibits tenderness ( tenderness for palpation of the pectoralis origin on the left with some possible edema.Patient has moderate tenderTenderness also noted at the right lower back hip and knee for range of motion passive and active). She exhibits no edema.  Lymphadenopathy:    She has no cervical adenopathy.  Neurological: She is alert and oriented to person, place, and time. She has normal reflexes.  Skin: Skin is warm and dry.  Psychiatric: She has a normal mood and affect. Her behavior is normal. Judgment and thought content normal.   EKG essentially normal for ischemic changes.  Normal sinus rhythm.  Minimal T wave inversion at the V1, V2 precordial leads.  Assessment & Plan:   [name] was seen today for shortness of breath, left under arm pain and stress.  Diagnoses and all orders for this visit:  Osteoarthritis involving multiple joints on both sides of body -     methylPREDNISolone acetate (DEPO-MEDROL) injection 80 mg  Chest tightness -     EKG 12-Lead  Pulmonary emphysema, unspecified emphysema type (HCC) -     methylPREDNISolone acetate (DEPO-MEDROL) injection 80 mg -     azithromycin (ZITHROMAX Z-PAK) 250 MG tablet; Take two right away Then one a day for the next 4 days.       I have discontinued [name]'s cephALEXin. I am also having her start on azithromycin. Additionally, I am having her maintain her PROAIR HFA, cholecalciferol, acetaminophen, risedronate, DULoxetine, meclizine, gabapentin, and amLODipine. We  administered methylPREDNISolone acetate.  Allergies as of 01/27/2017      Reactions   Avelox [moxifloxacin Hcl In Nacl]    Codeine Nausea And Vomiting      Medication List        Accurate as of 01/27/17 12:54 PM. Always use your most recent med list.          acetaminophen 500 MG tablet Commonly known as:  TYLENOL Take 1-2 tablets (500-1,000 mg total) by mouth daily.   amLODipine 5 MG tablet Commonly known as:  NORVASC TAKE 1 TABLET BY MOUTH DAILY   azithromycin 250 MG tablet Commonly known as:  ZITHROMAX Z-PAK Take two right away Then one a day for the next 4 days.   cholecalciferol 1000 units tablet Commonly known as:  VITAMIN D Take 1,000 Units by mouth daily.   DULoxetine 20 MG capsule Commonly known as:  CYMBALTA Take 2 capsules (40 mg total) by mouth daily.   gabapentin 300 MG capsule Commonly known as:  NEURONTIN TAKE 1 CAPSULE BY MOUTH  THREE TIMES A DAY   meclizine 25 MG tablet Commonly known as:  ANTIVERT TAKE 1 TABLET BY MOUTH TWICE A DAY   PROAIR HFA 108 (90 Base) MCG/ACT inhaler Generic drug:  albuterol INHALE 2 PUFFS INTO THE LUNGS EVERY 6 (SIX) HOURS AS NEEDED FOR WHEEZING OR SHORTNESS OF BREATH.   risedronate 150 MG tablet Commonly known as:  ACTONEL TAKE 1 TABLET EVERY 30 DAYS WITH WATER ON EMPTY STOMACH,DO NOT EAT,DRINK OR LIE DOWN FOR NEXT30 MINS        Follow-up: Return in about 6 weeks (around 03/10/2017), or if symptoms worsen or fail to improve.  Claretta Fraise, M.D.

## 2017-02-28 ENCOUNTER — Other Ambulatory Visit: Payer: Self-pay | Admitting: Pediatrics

## 2017-03-28 DIAGNOSIS — H43393 Other vitreous opacities, bilateral: Secondary | ICD-10-CM | POA: Diagnosis not present

## 2017-03-28 DIAGNOSIS — H43813 Vitreous degeneration, bilateral: Secondary | ICD-10-CM | POA: Diagnosis not present

## 2017-03-28 DIAGNOSIS — H04123 Dry eye syndrome of bilateral lacrimal glands: Secondary | ICD-10-CM | POA: Diagnosis not present

## 2017-03-28 DIAGNOSIS — H5203 Hypermetropia, bilateral: Secondary | ICD-10-CM | POA: Diagnosis not present

## 2017-03-29 ENCOUNTER — Other Ambulatory Visit: Payer: Self-pay | Admitting: Pediatrics

## 2017-03-29 DIAGNOSIS — M545 Low back pain: Principal | ICD-10-CM

## 2017-03-29 DIAGNOSIS — G8929 Other chronic pain: Secondary | ICD-10-CM

## 2017-05-28 NOTE — Progress Notes (Signed)
Subjective: CC: LE edema, falls, abdominal pain HPI: [name] is a 84 y.o. female presenting to clinic today for:  1. Falls Patient is brought to the office by her children who notes that she has had frequent falls.  Typically, she is able to brace herself against the wall or chair but yesterday she fell on an outstretched hand, injuring her left wrist and her buttock.  She has had mild swelling of the left wrist.  She notes that she has pain within her sacrum.  She notes that this is typically exacerbated with moving around.  She was able to ride in a car for 8 hours yesterday without difficulty.  She resides at home with her disabled husband.  Her children voiced concern for her well-being and voiced that she would benefit from physical therapy and home health if possible.  Patient notes that she is able to self dress and self bathe but cannot prepare meals or do chores.  She does not manage her own finances.  Her power of attorney is her youngest daughter Alvera Singh.    2. Abdominal pain Patient has had abdominal pain intermittently for about 1 week.  Her daughter thought it might be urinary related and gave her AZO last week.  Patient points to the right side of the abdomen and notes that it radiates to the right back .  She reports urinary frequency.  No dysuria, hematuria, fevers, chills, nausea, vomiting.  PSurg Hx cholecystectomy.    3. LE edema Family members note bilateral LE edema.  They note that she has been eating a lot of cracklin and Dr Samson Frederic.  Edema seems worse at the end of the day.  No new SOB, CP.  ROS: Per HPI  Past Medical History:  Diagnosis Date  . Acute diverticulitis   . Anemia   . Anxiety   . AVM (arteriovenous malformation) of colon   . Cataract   . Depression   . Diverticulosis 10/2009   Pandiverticulosis, per colonoscopy, Dr. Oneida Alar  . DJD (degenerative joint disease)   . Emphysema   . GERD (gastroesophageal reflux disease)   . Hypertension    . Internal hemorrhoids   . Rectal bleeding    Secondary to diverticulosis, hemorrhoids, and/or cecal AVMs  . Wrist fracture    Allergies  Allergen Reactions  . Avelox [Moxifloxacin Hcl In Nacl]   . Codeine Nausea And Vomiting    Current Outpatient Medications:  .  acetaminophen (TYLENOL) 500 MG tablet, Take 1-2 tablets (500-1,000 mg total) by mouth daily., Disp: 30 tablet, Rfl: 0 .  albuterol (PROAIR HFA) 108 (90 Base) MCG/ACT inhaler, INHALE 2 PUFFS INTO THE LUNGS EVERY 6 (SIX) HOURS AS NEEDED FOR WHEEZING OR SHORTNESS OF BREATH., Disp: 8.5 Inhaler, Rfl: 4 .  amLODipine (NORVASC) 5 MG tablet, TAKE 1 TABLET BY MOUTH EVERY DAY, Disp: 90 tablet, Rfl: 0 .  DULoxetine (CYMBALTA) 20 MG capsule, Take 2 capsules (40 mg total) by mouth daily., Disp: 180 capsule, Rfl: 1 .  gabapentin (NEURONTIN) 300 MG capsule, TAKE 1 CAPSULE BY MOUTH THREE TIMES A DAY, Disp: 270 capsule, Rfl: 0 .  meclizine (ANTIVERT) 25 MG tablet, TAKE 1 TABLET BY MOUTH TWICE A DAY, Disp: 180 tablet, Rfl: 0 .  risedronate (ACTONEL) 150 MG tablet, TAKE 1 TABLET EVERY 30 DAYS WITH WATER ON EMPTY STOMACH,DO NOT EAT,DRINK OR LIE DOWN FOR NEXT30 MINS, Disp: 3 tablet, Rfl: 3 Social History   Socioeconomic History  . Marital status: Married  Spouse name: Not on file  . Number of children: Not on file  . Years of education: Not on file  . Highest education level: Not on file  Occupational History  . Not on file  Social Needs  . Financial resource strain: Not on file  . Food insecurity:    Worry: Not on file    Inability: Not on file  . Transportation needs:    Medical: Not on file    Non-medical: Not on file  Tobacco Use  . Smoking status: Former Smoker    Packs/day: 1.00    Years: 65.00    Pack years: 65.00    Last attempt to quit: 10/26/2006    Years since quitting: 10.5  . Smokeless tobacco: Never Used  Substance and Sexual Activity  . Alcohol use: No  . Drug use: No  . Sexual activity: Never  Lifestyle  .  Physical activity:    Days per week: Not on file    Minutes per session: Not on file  . Stress: Not on file  Relationships  . Social connections:    Talks on phone: Not on file    Gets together: Not on file    Attends religious service: Not on file    Active member of club or organization: Not on file    Attends meetings of clubs or organizations: Not on file    Relationship status: Not on file  . Intimate partner violence:    Fear of current or ex partner: Not on file    Emotionally abused: Not on file    Physically abused: Not on file    Forced sexual activity: Not on file  Other Topics Concern  . Not on file  Social History Narrative  . Not on file   Family History  Problem Relation Age of Onset  . Asthma Mother   . Cancer Sister        stomach  . Heart attack Brother   . Epilepsy Brother   . Colon cancer Neg Hx     Health Maintenance: DEXA  Objective: Office vital signs reviewed. BP 139/84   Pulse 88   Temp (!) 97.5 F (36.4 C) (Oral)   Ht 5\' 4"  (1.626 m)   Wt 181 lb (82.1 kg)   BMI 31.07 kg/m   Physical Examination:  General: Awake, alert, well nourished, No acute distress HEENT: Sclera white, MMM, no JVD Cardio: regular rate and rhythm, S1S2 heard, no murmurs appreciated Pulm: clear to auscultation bilaterally, no wheezes, rhonchi or rales; normal work of breathing on room air GI: soft, +suprapubic TTP, non-distended, bowel sounds present x4, no hepatomegaly, no splenomegaly, no masses Extremities: warm, well perfused, trace bilateral pedal edema, cyanosis or clubbing; +2 pulses bilaterally MSK: normal gait   Left wrist: Minimal swelling along the distal aspect of the ulna.  No palpable bony deformities.  Patient has full active range of motion.  No discoloration.  Back: No palpable deformities.  No midline tenderness to palpation. Skin: dry; intact; no rashes or lesions Neuro: UE and LE light touch sensation grossly intact  Assessment/ Plan: 84 y.o.  female   1. Frequent falls I recommended that she resume use of walker to reduce falls.  I have placed a home health order for physical therapy, occupational therapy, RN and home health aide.  Discussed with the family members that this likely would only be for a few weeks.  We will need to consider ALF versus obtaining 24-hour 7 at home  care going forward.  We will check a BMP, UA as below. - Home Health - Face-to-face encounter (required for Medicare/Medicaid patients) - Basic Metabolic Panel  2. Impaired mobility and ADLs As above. - Home Health - Face-to-face encounter (required for Medicare/Medicaid patients)  3. Bilateral leg edema Likely related to salt intake.  Her cardiopulmonary exam was unremarkable.  She had trace pedal edema.  Handout for pedal edema provided. - Basic Metabolic Panel  4. Dysuria Urinalysis was ordered but patient unable to void during today's visit.  She was sent home with a hat and urine cup.  She will return this later on today.  We will plan to order antibiotics and urine culture pending urinalysis. - Urinalysis, Complete  5. Physical deconditioning - Home Health - Face-to-face encounter (required for Medicare/Medicaid patients)  6. Screening for lipid disorders - Lipid Panel  7. Sprain of left wrist, initial encounter Very mild.  There is certainly no evidence of fracture on today's visit.  [place] recommended.  Home care instructions were reviewed.  Handout provided.   Janora Norlander, DO [place] [phone]

## 2017-05-29 ENCOUNTER — Ambulatory Visit (INDEPENDENT_AMBULATORY_CARE_PROVIDER_SITE_OTHER): Payer: Medicare Other | Admitting: Family Medicine

## 2017-05-29 ENCOUNTER — Other Ambulatory Visit: Payer: Self-pay | Admitting: Family Medicine

## 2017-05-29 VITALS — BP 139/84 | HR 88 | Temp 97.5°F | Ht 64.0 in | Wt 181.0 lb

## 2017-05-29 DIAGNOSIS — R5381 Other malaise: Secondary | ICD-10-CM

## 2017-05-29 DIAGNOSIS — R600 Localized edema: Secondary | ICD-10-CM | POA: Diagnosis not present

## 2017-05-29 DIAGNOSIS — Z789 Other specified health status: Secondary | ICD-10-CM | POA: Insufficient documentation

## 2017-05-29 DIAGNOSIS — Z13220 Encounter for screening for lipoid disorders: Secondary | ICD-10-CM

## 2017-05-29 DIAGNOSIS — R300 Dysuria: Secondary | ICD-10-CM | POA: Diagnosis not present

## 2017-05-29 DIAGNOSIS — R296 Repeated falls: Secondary | ICD-10-CM

## 2017-05-29 DIAGNOSIS — S63502A Unspecified sprain of left wrist, initial encounter: Secondary | ICD-10-CM | POA: Diagnosis not present

## 2017-05-29 DIAGNOSIS — Z7409 Other reduced mobility: Secondary | ICD-10-CM | POA: Diagnosis not present

## 2017-05-29 LAB — URINALYSIS, COMPLETE
Bilirubin, UA: NEGATIVE
GLUCOSE, UA: NEGATIVE
Ketones, UA: NEGATIVE
NITRITE UA: NEGATIVE
PH UA: 6 (ref 5.0–7.5)
Specific Gravity, UA: 1.02 (ref 1.005–1.030)
UUROB: 0.2 mg/dL (ref 0.2–1.0)

## 2017-05-29 LAB — MICROSCOPIC EXAMINATION

## 2017-05-29 MED ORDER — CEPHALEXIN 500 MG PO CAPS: 500.0000 mg | ORAL_CAPSULE | Freq: Two times a day (BID) | ORAL | 0 refills | Status: AC

## 2017-05-29 NOTE — Patient Instructions (Signed)
Edema Edema is when you have too much fluid in your body or under your skin. Edema may make your legs, feet, and ankles swell up. Swelling is also common in looser tissues, like around your eyes. This is a common condition. It gets more common as you get older. There are many possible causes of edema. Eating too much salt (sodium) and being on your feet or sitting for a long time can cause edema in your legs, feet, and ankles. Hot weather may make edema worse. Edema is usually painless. Your skin may look swollen or shiny. Follow these instructions at home:  Keep the swollen body part raised (elevated) above the level of your heart when you are sitting or lying down.  Do not sit still or stand for a long time.  Do not wear tight clothes. Do not wear garters on your upper legs.  Exercise your legs. This can help the swelling go down.  Wear elastic bandages or support stockings as told by your doctor.  Eat a low-salt (low-sodium) diet to reduce fluid as told by your doctor.  Depending on the cause of your swelling, you may need to limit how much fluid you drink (fluid restriction).  Take over-the-counter and prescription medicines only as told by your doctor. Contact a doctor if:  Treatment is not working.  You have heart, liver, or kidney disease and have symptoms of edema.  You have sudden and unexplained weight gain. Get help right away if:  You have shortness of breath or chest pain.  You cannot breathe when you lie down.  You have pain, redness, or warmth in the swollen areas.  You have heart, liver, or kidney disease and get edema all of a sudden.  You have a fever and your symptoms get worse all of a sudden. Summary  Edema is when you have too much fluid in your body or under your skin.  Edema may make your legs, feet, and ankles swell up. Swelling is also common in looser tissues, like around your eyes.  Raise (elevate) the swollen body part above the level of your  heart when you are sitting or lying down.  Follow your doctor's instructions about diet and how much fluid you can drink (fluid restriction). This information is not intended to replace advice given to you by your health care provider. Make sure you discuss any questions you have with your health care provider. Document Released: 06/14/2007 Document Revised: 01/14/2016 Document Reviewed: 01/14/2016 Elsevier Interactive Patient Education  2017 [employer]   Wrist Sprain, Adult A wrist sprain is a stretch or tear in the strong, fibrous tissues (ligaments) that connect your wrist bones. There are three types of wrist sprains:  Grade 1. In this type of sprain, the ligament is stretched more than normal.  Grade 2. In this type of sprain, the ligament is partially torn. You may be able to move your wrist, but not very much.  Grade 3. In this type of sprain, the ligament or muscle is completely torn. You may find it difficult or extremely painful to move your wrist even a little.  What are the causes? A wrist sprain can be caused by using the wrist too much during sports, exercise, or at work. It can also happen with a fall or during an accident. What increases the risk? This condition is more likely to occur in people:  With a previous wrist or arm injury.  With poor wrist strength and flexibility.  Who play contact  sports, such as football or soccer.  Who play sports that may result in a fall, such as skateboarding, biking, skiing, or snowboarding.  Who do not exercise regularly.  Who use exercise equipment that does not fit well.  What are the signs or symptoms? Symptoms of this condition include:  Pain in the wrist, arm, or hand.  Swelling or bruised skin near the wrist, hand, or arm. The skin may look yellow or kind of blue.  Stiffness or trouble moving the hand.  Hearing a pop or feeling a tear at the time of the injury.  A warm feeling in the skin around the  wrist.  How is this diagnosed? This condition is diagnosed with a physical exam. Sometimes an X-ray is taken to make sure a bone did not break. If your health care provider thinks that you tore a ligament, he or she may order an MRI of your wrist. How is this treated? This condition is treated by resting and applying ice to your wrist. Additional treatment may include:  Medicine for pain and inflammation.  A splint to keep your wrist still (immobilized).  Exercises to strengthen and stretch your wrist.  Surgery. This may be done if the ligament is completely torn.  Follow these instructions at home: If you have a splint:   Do not put pressure on any part of the splint until it is fully hardened. This may take several hours.  Wear the splint as told by your health care provider. Remove it only as told by your health care provider.  Loosen the splint if your fingers tingle, become numb, or turn cold and blue.  If your splint is not waterproof: ? Do not let it get wet. ? Cover it with a watertight covering when you take a bath or a shower.  Keep the splint clean. Managing pain, stiffness, and swelling   If directed, put ice on the injured area. ? If you have a removable splint, remove it as told by your health care provider. ? Put ice in a plastic bag. ? Place a towel between your skin and the bag or between the splint and the bag. ? Leave the ice on for 20 minutes, 2-3 times per day.  Move your fingers often to avoid stiffness and to lessen swelling.  Raise (elevate) the injured area above the level of your heart while you are sitting or lying down. Activity  Rest your wrist. Do not do things that cause pain.  Return to your normal activities as told by your health care provider. Ask your health care provider what activities are safe for you.  Do exercises as told by your health care provider. General instructions  Take over-the-counter and prescription medicines only  as told by your health care provider.  Do not use any products that contain nicotine or tobacco, such as cigarettes and e-cigarettes. These can delay healing. If you need help quitting, ask your health care provider.  Ask your health care provider when it is safe to drive if you have a splint.  Keep all follow-up visits as told by your health care provider. This is important. Contact a health care provider if:  Your pain, bruising, or swelling gets worse.  Your skin becomes red, gets a rash, or has open sores.  Your pain does not get better or it gets worse. Get help right away if:  You have a new or sudden sharp pain in the hand, arm, or wrist.  You have tingling  or numbness in your hand.  Your fingers turn white, very red, or cold and blue.  You cannot move your fingers. This information is not intended to replace advice given to you by your health care provider. Make sure you discuss any questions you have with your health care provider. Document Released: 08/29/2013 Document Revised: 07/24/2015 Document Reviewed: 07/15/2015 Elsevier Interactive Patient Education  [employer].

## 2017-05-29 NOTE — Progress Notes (Signed)
Please inform patient that I have sent in antibiotic for urinary tract infection.  Take twice daily for next 10 days.    Meds ordered this encounter  Medications  . cephALEXin (KEFLEX) 500 MG capsule    Sig: Take 1 capsule (500 mg total) by mouth 2 (two) times daily for 10 days.    Dispense:  20 capsule    Refill:  0

## 2017-05-29 NOTE — Addendum Note (Signed)
Addended byCarrolyn Leigh on: 05/29/2017 03:25 PM   Modules accepted: Orders

## 2017-05-30 ENCOUNTER — Ambulatory Visit: Payer: Medicare Other | Admitting: Family Medicine

## 2017-05-30 LAB — LIPID PANEL
Chol/HDL Ratio: 4 ratio (ref 0.0–4.4)
Cholesterol, Total: 156 mg/dL (ref 100–199)
HDL: 39 mg/dL — ABNORMAL LOW (ref >39)
LDL CALC: 91 mg/dL (ref 0–99)
Triglycerides: 131 mg/dL (ref 0–149)
VLDL CHOLESTEROL CAL: 26 mg/dL (ref 5–40)

## 2017-05-30 LAB — BASIC METABOLIC PANEL
BUN / CREAT RATIO: 14 (ref 12–28)
BUN: 12 mg/dL (ref 8–27)
CHLORIDE: 104 mmol/L (ref 96–106)
CO2: 26 mmol/L (ref 20–29)
Calcium: 9.9 mg/dL (ref 8.7–10.3)
Creatinine, Ser: 0.84 mg/dL (ref 0.57–1.00)
GFR calc non Af Amer: 63 mL/min/{1.73_m2} (ref >59)
GFR, EST AFRICAN AMERICAN: 73 mL/min/{1.73_m2} (ref >59)
Glucose: 91 mg/dL (ref 65–99)
Potassium: 3.9 mmol/L (ref 3.5–5.2)
SODIUM: 146 mmol/L — AB (ref 134–144)

## 2017-05-30 LAB — URINE CULTURE

## 2017-05-31 ENCOUNTER — Ambulatory Visit: Payer: Medicare Other | Admitting: Pediatrics

## 2017-05-31 ENCOUNTER — Telehealth: Payer: Self-pay | Admitting: *Deleted

## 2017-05-31 DIAGNOSIS — S63502D Unspecified sprain of left wrist, subsequent encounter: Secondary | ICD-10-CM | POA: Diagnosis not present

## 2017-05-31 DIAGNOSIS — I10 Essential (primary) hypertension: Secondary | ICD-10-CM | POA: Diagnosis not present

## 2017-05-31 DIAGNOSIS — M1712 Unilateral primary osteoarthritis, left knee: Secondary | ICD-10-CM | POA: Diagnosis not present

## 2017-05-31 DIAGNOSIS — M19031 Primary osteoarthritis, right wrist: Secondary | ICD-10-CM | POA: Diagnosis not present

## 2017-05-31 DIAGNOSIS — J439 Emphysema, unspecified: Secondary | ICD-10-CM | POA: Diagnosis not present

## 2017-05-31 NOTE — Telephone Encounter (Signed)
TC from [place] During visit [facility] nurse went over medications Patient is not taking Risedronate Please advise if she is suppose to will need Rx

## 2017-06-01 ENCOUNTER — Other Ambulatory Visit: Payer: Self-pay | Admitting: Family Medicine

## 2017-06-01 DIAGNOSIS — R3129 Other microscopic hematuria: Secondary | ICD-10-CM

## 2017-06-01 DIAGNOSIS — M1712 Unilateral primary osteoarthritis, left knee: Secondary | ICD-10-CM | POA: Diagnosis not present

## 2017-06-01 DIAGNOSIS — M19031 Primary osteoarthritis, right wrist: Secondary | ICD-10-CM | POA: Diagnosis not present

## 2017-06-01 DIAGNOSIS — I10 Essential (primary) hypertension: Secondary | ICD-10-CM | POA: Diagnosis not present

## 2017-06-01 DIAGNOSIS — J439 Emphysema, unspecified: Secondary | ICD-10-CM | POA: Diagnosis not present

## 2017-06-01 DIAGNOSIS — S63502D Unspecified sprain of left wrist, subsequent encounter: Secondary | ICD-10-CM | POA: Diagnosis not present

## 2017-06-01 NOTE — Telephone Encounter (Signed)
[facility] nurse aware pt should still be taking med & there are refills at [facility]

## 2017-06-01 NOTE — Telephone Encounter (Signed)
Yes, this was prescribed by previous PCP.  This is for osteoporosis.  She should have enough refills through August.

## 2017-06-04 DIAGNOSIS — J439 Emphysema, unspecified: Secondary | ICD-10-CM | POA: Diagnosis not present

## 2017-06-04 DIAGNOSIS — M1712 Unilateral primary osteoarthritis, left knee: Secondary | ICD-10-CM | POA: Diagnosis not present

## 2017-06-04 DIAGNOSIS — I10 Essential (primary) hypertension: Secondary | ICD-10-CM | POA: Diagnosis not present

## 2017-06-04 DIAGNOSIS — M19031 Primary osteoarthritis, right wrist: Secondary | ICD-10-CM | POA: Diagnosis not present

## 2017-06-04 DIAGNOSIS — S63502D Unspecified sprain of left wrist, subsequent encounter: Secondary | ICD-10-CM | POA: Diagnosis not present

## 2017-06-06 DIAGNOSIS — I10 Essential (primary) hypertension: Secondary | ICD-10-CM | POA: Diagnosis not present

## 2017-06-06 DIAGNOSIS — J439 Emphysema, unspecified: Secondary | ICD-10-CM | POA: Diagnosis not present

## 2017-06-06 DIAGNOSIS — M1712 Unilateral primary osteoarthritis, left knee: Secondary | ICD-10-CM | POA: Diagnosis not present

## 2017-06-06 DIAGNOSIS — M19031 Primary osteoarthritis, right wrist: Secondary | ICD-10-CM | POA: Diagnosis not present

## 2017-06-06 DIAGNOSIS — S63502D Unspecified sprain of left wrist, subsequent encounter: Secondary | ICD-10-CM | POA: Diagnosis not present

## 2017-06-07 DIAGNOSIS — I10 Essential (primary) hypertension: Secondary | ICD-10-CM | POA: Diagnosis not present

## 2017-06-07 DIAGNOSIS — M1712 Unilateral primary osteoarthritis, left knee: Secondary | ICD-10-CM | POA: Diagnosis not present

## 2017-06-07 DIAGNOSIS — S63502D Unspecified sprain of left wrist, subsequent encounter: Secondary | ICD-10-CM | POA: Diagnosis not present

## 2017-06-07 DIAGNOSIS — J439 Emphysema, unspecified: Secondary | ICD-10-CM | POA: Diagnosis not present

## 2017-06-07 DIAGNOSIS — M19031 Primary osteoarthritis, right wrist: Secondary | ICD-10-CM | POA: Diagnosis not present

## 2017-06-08 DIAGNOSIS — M1712 Unilateral primary osteoarthritis, left knee: Secondary | ICD-10-CM | POA: Diagnosis not present

## 2017-06-08 DIAGNOSIS — I10 Essential (primary) hypertension: Secondary | ICD-10-CM | POA: Diagnosis not present

## 2017-06-08 DIAGNOSIS — S63502D Unspecified sprain of left wrist, subsequent encounter: Secondary | ICD-10-CM | POA: Diagnosis not present

## 2017-06-08 DIAGNOSIS — J439 Emphysema, unspecified: Secondary | ICD-10-CM | POA: Diagnosis not present

## 2017-06-08 DIAGNOSIS — M19031 Primary osteoarthritis, right wrist: Secondary | ICD-10-CM | POA: Diagnosis not present

## 2017-06-11 DIAGNOSIS — I10 Essential (primary) hypertension: Secondary | ICD-10-CM | POA: Diagnosis not present

## 2017-06-11 DIAGNOSIS — M1712 Unilateral primary osteoarthritis, left knee: Secondary | ICD-10-CM | POA: Diagnosis not present

## 2017-06-11 DIAGNOSIS — S63502D Unspecified sprain of left wrist, subsequent encounter: Secondary | ICD-10-CM | POA: Diagnosis not present

## 2017-06-11 DIAGNOSIS — M19031 Primary osteoarthritis, right wrist: Secondary | ICD-10-CM | POA: Diagnosis not present

## 2017-06-11 DIAGNOSIS — J439 Emphysema, unspecified: Secondary | ICD-10-CM | POA: Diagnosis not present

## 2017-06-12 ENCOUNTER — Ambulatory Visit (INDEPENDENT_AMBULATORY_CARE_PROVIDER_SITE_OTHER): Payer: Medicare Other

## 2017-06-12 DIAGNOSIS — F329 Major depressive disorder, single episode, unspecified: Secondary | ICD-10-CM

## 2017-06-12 DIAGNOSIS — S63502D Unspecified sprain of left wrist, subsequent encounter: Secondary | ICD-10-CM

## 2017-06-12 DIAGNOSIS — M19032 Primary osteoarthritis, left wrist: Secondary | ICD-10-CM | POA: Diagnosis not present

## 2017-06-12 DIAGNOSIS — F419 Anxiety disorder, unspecified: Secondary | ICD-10-CM

## 2017-06-12 DIAGNOSIS — J439 Emphysema, unspecified: Secondary | ICD-10-CM

## 2017-06-12 DIAGNOSIS — Q2733 Arteriovenous malformation of digestive system vessel: Secondary | ICD-10-CM

## 2017-06-12 DIAGNOSIS — M1712 Unilateral primary osteoarthritis, left knee: Secondary | ICD-10-CM

## 2017-06-12 DIAGNOSIS — I10 Essential (primary) hypertension: Secondary | ICD-10-CM | POA: Diagnosis not present

## 2017-06-12 DIAGNOSIS — M19031 Primary osteoarthritis, right wrist: Secondary | ICD-10-CM

## 2017-06-12 DIAGNOSIS — K5790 Diverticulosis of intestine, part unspecified, without perforation or abscess without bleeding: Secondary | ICD-10-CM | POA: Diagnosis not present

## 2017-06-13 DIAGNOSIS — J439 Emphysema, unspecified: Secondary | ICD-10-CM | POA: Diagnosis not present

## 2017-06-13 DIAGNOSIS — M19031 Primary osteoarthritis, right wrist: Secondary | ICD-10-CM | POA: Diagnosis not present

## 2017-06-13 DIAGNOSIS — M1712 Unilateral primary osteoarthritis, left knee: Secondary | ICD-10-CM | POA: Diagnosis not present

## 2017-06-13 DIAGNOSIS — S63502D Unspecified sprain of left wrist, subsequent encounter: Secondary | ICD-10-CM | POA: Diagnosis not present

## 2017-06-13 DIAGNOSIS — I10 Essential (primary) hypertension: Secondary | ICD-10-CM | POA: Diagnosis not present

## 2017-06-15 DIAGNOSIS — S63502D Unspecified sprain of left wrist, subsequent encounter: Secondary | ICD-10-CM | POA: Diagnosis not present

## 2017-06-15 DIAGNOSIS — M1712 Unilateral primary osteoarthritis, left knee: Secondary | ICD-10-CM | POA: Diagnosis not present

## 2017-06-15 DIAGNOSIS — J439 Emphysema, unspecified: Secondary | ICD-10-CM | POA: Diagnosis not present

## 2017-06-15 DIAGNOSIS — M19031 Primary osteoarthritis, right wrist: Secondary | ICD-10-CM | POA: Diagnosis not present

## 2017-06-15 DIAGNOSIS — I10 Essential (primary) hypertension: Secondary | ICD-10-CM | POA: Diagnosis not present

## 2017-06-16 ENCOUNTER — Encounter: Payer: Self-pay | Admitting: Family Medicine

## 2017-06-16 ENCOUNTER — Ambulatory Visit (INDEPENDENT_AMBULATORY_CARE_PROVIDER_SITE_OTHER): Payer: Medicare Other | Admitting: Family Medicine

## 2017-06-16 VITALS — BP 122/78 | HR 94 | Temp 97.4°F | Ht 64.0 in | Wt 181.2 lb

## 2017-06-16 DIAGNOSIS — R399 Unspecified symptoms and signs involving the genitourinary system: Secondary | ICD-10-CM

## 2017-06-16 MED ORDER — CEPHALEXIN 500 MG PO CAPS: 500.0000 mg | ORAL_CAPSULE | Freq: Four times a day (QID) | ORAL | 0 refills | Status: DC

## 2017-06-16 NOTE — Progress Notes (Signed)
BP 122/78   Pulse 94   Temp (!) 97.4 F (36.3 C) (Oral)   Ht 5\' 4"  (1.626 m)   Wt 181 lb 3.2 oz (82.2 kg)   BMI 31.10 kg/m    Subjective:    Patient ID: [name], female    DOB: 01/02/1929, 84 y.o.   MRN: [id number]  HPI: [name] is a 84 y.o. female presenting on 06/16/2017 for Lower abdominal pressure and Urinary Urgency   HPI Abdominal pressure and urinary urgency Patient comes in complaining of abdominal pressure and urinary urgency that has been worsening and not improving.  She was seen by Dr. Lajuana Ripple recently for this and did not find any signs of infection on the culture.  She is unable to leave urine today.  She says that she feels like is just getting worse and she is having increasing difficulty urinating and feels like she has to go frequently but nothing comes out frequently.  She has a lot of urgency associated with it.  She denies any flank pain or fevers or chills.  Relevant past medical, surgical, family and social history reviewed and updated as indicated. Interim medical history since our last visit reviewed. Allergies and medications reviewed and updated.  Review of Systems  Constitutional: Negative for chills and fever.  Eyes: Negative for visual disturbance.  Respiratory: Negative for chest tightness and shortness of breath.   Cardiovascular: Negative for chest pain and leg swelling.  Gastrointestinal: Positive for abdominal pain. Negative for constipation, diarrhea, nausea and vomiting.  Genitourinary: Positive for decreased urine volume, difficulty urinating, dysuria, frequency and urgency. Negative for hematuria, vaginal bleeding, vaginal discharge and vaginal pain.  Musculoskeletal: Negative for back pain and gait problem.  Skin: Negative for rash.  Neurological: Negative for dizziness, light-headedness and headaches.  Psychiatric/Behavioral: Negative for agitation and behavioral problems.  All other systems reviewed and are  negative.   Per HPI unless specifically indicated above   Allergies as of 06/16/2017      Reactions   Avelox [moxifloxacin Hcl In Nacl]    Codeine Nausea And Vomiting      Medication List        Accurate as of 06/16/17  9:19 AM. Always use your most recent med list.          acetaminophen 500 MG tablet Commonly known as:  TYLENOL Take 1-2 tablets (500-1,000 mg total) by mouth daily.   albuterol 108 (90 Base) MCG/ACT inhaler Commonly known as:  PROAIR HFA INHALE 2 PUFFS INTO THE LUNGS EVERY 6 (SIX) HOURS AS NEEDED FOR WHEEZING OR SHORTNESS OF BREATH.   amLODipine 5 MG tablet Commonly known as:  NORVASC TAKE 1 TABLET BY MOUTH EVERY DAY   cephALEXin 500 MG capsule Commonly known as:  KEFLEX Take 1 capsule (500 mg total) by mouth 4 (four) times daily.   DULoxetine 20 MG capsule Commonly known as:  CYMBALTA Take 2 capsules (40 mg total) by mouth daily.   gabapentin 300 MG capsule Commonly known as:  NEURONTIN TAKE 1 CAPSULE BY MOUTH THREE TIMES A DAY   meclizine 25 MG tablet Commonly known as:  ANTIVERT TAKE 1 TABLET BY MOUTH TWICE A DAY   risedronate 150 MG tablet Commonly known as:  ACTONEL TAKE 1 TABLET EVERY 30 DAYS WITH WATER ON EMPTY STOMACH,DO NOT EAT,DRINK OR LIE DOWN FOR NEXT30 MINS          Objective:    BP 122/78   Pulse 94   Temp (!)  97.4 F (36.3 C) (Oral)   Ht 5\' 4"  (1.626 m)   Wt 181 lb 3.2 oz (82.2 kg)   BMI 31.10 kg/m   Wt Readings from Last 3 Encounters:  06/16/17 181 lb 3.2 oz (82.2 kg)  05/29/17 181 lb (82.1 kg)  01/27/17 181 lb (82.1 kg)    Physical Exam  Constitutional: She is oriented to person, place, and time. She appears well-developed and well-nourished. No distress.  Eyes: Conjunctivae are normal.  Cardiovascular: Normal rate, regular rhythm, normal heart sounds and intact distal pulses.  No murmur heard. Pulmonary/Chest: Effort normal and breath sounds normal. No respiratory distress. She has no wheezes.  Abdominal:  Soft. Bowel sounds are normal. She exhibits no distension and no mass. There is tenderness in the suprapubic area. There is no rigidity, no rebound, no guarding and no CVA tenderness.  Neurological: She is alert and oriented to person, place, and time. Coordination normal.  Skin: Skin is warm and dry. No rash noted. She is not diaphoretic.  Psychiatric: She has a normal mood and affect. Her behavior is normal.  Nursing note and vitals reviewed.       Assessment & Plan:   Problem List Items Addressed This Visit    None    Visit Diagnoses    UTI symptoms    -  Primary   Relevant Medications   cephALEXin (KEFLEX) 500 MG capsule   Other Relevant Orders   Urinalysis      Unable to leave urine again today, has urology referral in place, will treat symptomatically just to see if we can get an improved her now but it sounds like she has some significant bladder dysfunction and really needs to get into urology as soon as possible, being that is the weekend we cannot contact them today but our office will look into it on Monday Follow up plan: Return in about 1 week (around 06/23/2017), or if symptoms worsen or fail to improve, for Follow-up with PCP on pain and abdominal pressure.  Counseling provided for all of the vaccine components Orders Placed This Encounter  Procedures  . Urinalysis    Caryl Pina, MD [facility] 06/16/2017, 9:19 AM

## 2017-06-18 DIAGNOSIS — M19031 Primary osteoarthritis, right wrist: Secondary | ICD-10-CM | POA: Diagnosis not present

## 2017-06-18 DIAGNOSIS — M1712 Unilateral primary osteoarthritis, left knee: Secondary | ICD-10-CM | POA: Diagnosis not present

## 2017-06-18 DIAGNOSIS — S63502D Unspecified sprain of left wrist, subsequent encounter: Secondary | ICD-10-CM | POA: Diagnosis not present

## 2017-06-18 DIAGNOSIS — J439 Emphysema, unspecified: Secondary | ICD-10-CM | POA: Diagnosis not present

## 2017-06-18 DIAGNOSIS — I10 Essential (primary) hypertension: Secondary | ICD-10-CM | POA: Diagnosis not present

## 2017-06-19 ENCOUNTER — Ambulatory Visit (INDEPENDENT_AMBULATORY_CARE_PROVIDER_SITE_OTHER): Payer: Medicare Other | Admitting: *Deleted

## 2017-06-19 VITALS — BP 130/80 | HR 90 | Ht 64.0 in | Wt 183.0 lb

## 2017-06-19 DIAGNOSIS — Z0000 Encounter for general adult medical examination without abnormal findings: Secondary | ICD-10-CM

## 2017-06-19 NOTE — Patient Instructions (Addendum)
Schedule follow up appointment wit Dr. Lajuana Ripple.   Ms. Agrusa , Thank you for taking time to come for your Medicare Wellness Visit. I appreciate your ongoing commitment to your health goals. Please review the following plan we discussed and let me know if I can assist you in the future.   These are the goals we discussed: Goals    . Exercise 3x per week (30 min per time)     Walk with assistive devices as tolerated. Suggested chair exercises.       This is a list of the screening recommended for you and due dates:  Health Maintenance  Topic Date Due  . DEXA scan (bone density measurement)  04/09/2016  . Flu Shot  08/09/2017  . Tetanus Vaccine  04/09/2024  . Pneumonia vaccines  Completed    Advance Directive Advance directives are legal documents that let you make choices ahead of time about your health care and medical treatment in case you become unable to communicate for yourself. Advance directives are a way for you to communicate your wishes to family, friends, and health care providers. This can help convey your decisions about end-of-life care if you become unable to communicate. Discussing and writing advance directives should happen over time rather than all at once. Advance directives can be changed depending on your situation and what you want, even after you have signed the advance directives. If you do not have an advance directive, some states assign family decision makers to act on your behalf based on how closely you are related to them. Each state has its own laws regarding advance directives. You may want to check with your health care provider, attorney, or state representative about the laws in your state. There are different types of advance directives, such as:  Medical power of attorney.  Living will.  Do not resuscitate (DNR) or do not attempt resuscitation (DNAR) order.  Health care proxy and medical power of attorney A health care proxy, also called a  health care agent, is a person who is appointed to make medical decisions for you in cases in which you are unable to make the decisions yourself. Generally, people choose someone they know well and trust to represent their preferences. Make sure to ask this person for an agreement to act as your proxy. A proxy may have to exercise judgment in the event of a medical decision for which your wishes are not known. A medical power of attorney is a legal document that names your health care proxy. Depending on the laws in your state, after the document is written, it may also need to be:  Signed.  Notarized.  Dated.  Copied.  Witnessed.  Incorporated into your medical record.  You may also want to appoint someone to manage your financial affairs in a situation in which you are unable to do so. This is called a durable power of attorney for finances. It is a separate legal document from the durable power of attorney for health care. You may choose the same person or someone different from your health care proxy to act as your agent in financial matters. If you do not appoint a proxy, or if there is a concern that the proxy is not acting in your best interests, a court-appointed guardian may be designated to act on your behalf. Living will A living will is a set of instructions documenting your wishes about medical care when you cannot express them yourself. Health care providers should keep  a copy of your living will in your medical record. You may want to give a copy to family members or friends. To alert caregivers in case of an emergency, you can place a card in your wallet to let them know that you have a living will and where they can find it. A living will is used if you become:  Terminally ill.  Incapacitated.  Unable to communicate or make decisions.  Items to consider in your living will include:  The use or non-use of life-sustaining equipment, such as dialysis machines and breathing  machines (ventilators).  A DNR or DNAR order, which is the instruction not to use cardiopulmonary resuscitation (CPR) if breathing or heartbeat stops.  The use or non-use of tube feeding.  Withholding of food and fluids.  Comfort (palliative) care when the goal becomes comfort rather than a cure.  Organ and tissue donation.  A living will does not give instructions for distributing your money and property if you should pass away. It is recommended that you seek the advice of a lawyer when writing a will. Decisions about taxes, beneficiaries, and asset distribution will be legally binding. This process can relieve your family and friends of any concerns surrounding disputes or questions that may come up about the distribution of your assets. DNR or DNAR A DNR or DNAR order is a request not to have CPR in the event that your heart stops beating or you stop breathing. If a DNR or DNAR order has not been made and shared, a health care provider will try to help any patient whose heart has stopped or who has stopped breathing. If you plan to have surgery, talk with your health care provider about how your DNR or DNAR order will be followed if problems occur. Summary  Advance directives are the legal documents that allow you to make choices ahead of time about your health care and medical treatment in case you become unable to communicate for yourself.  The process of discussing and writing advance directives should happen over time. You can change the advance directives, even after you have signed them.  Advance directives include DNR or DNAR orders, living wills, and designating an agent as your medical power of attorney. This information is not intended to replace advice given to you by your health care provider. Make sure you discuss any questions you have with your health care provider. Document Released: 04/04/2007 Document Revised: 11/15/2015 Document Reviewed: 11/15/2015 Elsevier Interactive  Patient Education  2017 [employer].

## 2017-06-19 NOTE — Progress Notes (Signed)
Subjective:   [name] is a 84 y.o. female who presents for Medicare Annual (Subsequent) preventive examination.n  Mrs. [name] lives at home with husband, [name] of 73 years.  She has 2 daughters that come in daily to help with medications and meals. She also has a son that lives in [place] that she visits often for long periods of time.  Mrs. [name] enjoys watching "young and restless, doing word searchs and reading when she can find a good book.  Mrs. [name] eat 3 small meals daily that consist of proteins, fruits and vegetables.   She has physical therapy come in 3 times weekly for an hour.  Mrs.  [name] has had no hospitalizations or surgeries in the past year.  Overall Mrs. [name] feels that her health is the same as it was a year ago.     Objective:     Vitals: BP 130/80   Pulse 90   Ht 5\' 4"  (1.626 m)   Wt 183 lb (83 kg)   BMI 31.41 kg/m   Body mass index is 31.41 kg/m.  Advanced Directives 09/20/2015 11/15/2014 04/10/2014 08/08/2012 10/26/2011  Does Patient Have a Medical Advance Directive? Yes No;Yes Yes Patient does not have advance directive;Patient would like information Patient has advance directive, copy not in chart  Type of Advance Directive - Living will;Out of facility DNR (pink MOST or yellow form) [place];Living will - Living will;Healthcare Power of Attorney  Does patient want to make changes to medical advance directive? - - No - Patient declined - -  Copy of [place] in Chart? - - No - copy requested - Copy requested from family  Would patient like information on creating a medical advance directive? - - - Advance directive packet given -  Pre-existing out of facility DNR order (yellow form or pink MOST form) - - - No No  Copy requested  Tobacco Social History   Tobacco Use  Smoking Status Former Smoker  . Packs/day: 1.00  . Years: 65.00  . Pack years: 65.00  . Last attempt to quit: 10/26/2006  .  Years since quitting: 10.6  Smokeless Tobacco Never Used     Counseling given: Not Answered   Clinical Intake:  Pre-visit preparation completed: No  Pain : 0-10 Pain Score: 8  Pain Type: Chronic pain Pain Location: Back Pain Descriptors / Indicators: Aching Pain Onset: More than a month ago Pain Frequency: Several days a week Pain Relieving Factors: Tylenol Effect of Pain on Daily Activities: Therapist  Pain Relieving Factors: Tylenol  Nutritional Status: BMI > 30  Obese Nutritional Risks: None Diabetes: No  How often do you need to have someone help you when you read instructions, pamphlets, or other written materials from your doctor or pharmacy?: 1 - Never What is the last grade level you completed in school?: 8th Grade  Interpreter Needed?: No  Information entered by :: Truett Mainland, LPN  Past Medical History:  Diagnosis Date  . Acute diverticulitis   . Anemia   . Anxiety   . AVM (arteriovenous malformation) of colon   . Cataract   . Depression   . Diverticulosis 10/2009   Pandiverticulosis, per colonoscopy, Dr. Oneida Alar  . DJD (degenerative joint disease)   . Emphysema   . GERD (gastroesophageal reflux disease)   . Hypertension   . Internal hemorrhoids   . Rectal bleeding    Secondary to diverticulosis, hemorrhoids, and/or cecal AVMs  . Wrist fracture  Past Surgical History:  Procedure Laterality Date  . CATARACT EXTRACTION Bilateral 2017  . COLONOSCOPY  Oct 2011   Dr. Oneida Alar: pancolonic diverticulosis, 2 cecal AVMs s/p ablation, retained stool in left colon, moderate internal hemorrhoids  . ESOPHAGOGASTRODUODENOSCOPY Left 08/10/2012   Procedure: ESOPHAGOGASTRODUODENOSCOPY (EGD);  Surgeon: Daneil Dolin, MD;  Location: [facility];  Service: Endoscopy;  Laterality: Left;  EGD for abdominal pain and GIB  . Right total knee arthroplasty     X2   Family History  Problem Relation Age of Onset  . Asthma Mother   . Cancer Sister        stomach  .  Heart attack Brother   . Epilepsy Brother   . Colon cancer Neg Hx    Social History   Socioeconomic History  . Marital status: Married    Spouse name: [name]  . Number of children: 8  . Years of education: Not on file  . Highest education level: 8th grade  Occupational History  . Occupation: House Wife  Social Needs  . Financial resource strain: Not hard at all  . Food insecurity:    Worry: Never true    Inability: Never true  . Transportation needs:    Medical: No    Non-medical: No  Tobacco Use  . Smoking status: Former Smoker    Packs/day: 1.00    Years: 65.00    Pack years: 65.00    Last attempt to quit: 10/26/2006    Years since quitting: 10.6  . Smokeless tobacco: Never Used  Substance and Sexual Activity  . Alcohol use: No  . Drug use: No  . Sexual activity: Never  Lifestyle  . Physical activity:    Days per week: 3 days    Minutes per session: 60 min  . Stress: Not on file  Relationships  . Social connections:    Talks on phone: More than three times a week    Gets together: More than three times a week    Attends religious service: Never    Active member of club or organization: No    Attends meetings of clubs or organizations: Never    Relationship status: Married  Other Topics Concern  . Not on file  Social History Narrative  . Not on file    Outpatient Encounter Medications as of 06/19/2017  Medication Sig  . acetaminophen (TYLENOL) 500 MG tablet Take 1-2 tablets (500-1,000 mg total) by mouth daily.  Marland Kitchen albuterol (PROAIR HFA) 108 (90 Base) MCG/ACT inhaler INHALE 2 PUFFS INTO THE LUNGS EVERY 6 (SIX) HOURS AS NEEDED FOR WHEEZING OR SHORTNESS OF BREATH.  Marland Kitchen amLODipine (NORVASC) 5 MG tablet TAKE 1 TABLET BY MOUTH EVERY DAY  . cephALEXin (KEFLEX) 500 MG capsule Take 1 capsule (500 mg total) by mouth 4 (four) times daily.  . DULoxetine (CYMBALTA) 20 MG capsule Take 2 capsules (40 mg total) by mouth daily.  Marland Kitchen gabapentin (NEURONTIN) 300 MG capsule TAKE 1  CAPSULE BY MOUTH THREE TIMES A DAY  . meclizine (ANTIVERT) 25 MG tablet TAKE 1 TABLET BY MOUTH TWICE A DAY  . risedronate (ACTONEL) 150 MG tablet TAKE 1 TABLET EVERY 30 DAYS WITH WATER ON EMPTY STOMACH,DO NOT EAT,DRINK OR LIE DOWN FOR NEXT30 MINS   No facility-administered encounter medications on file as of 06/19/2017.     Activities of Daily Living In your present state of health, do you have any difficulty performing the following activities: 06/19/2017  Hearing? N  Vision? N  Difficulty concentrating  or making decisions? N  Walking or climbing stairs? N  Dressing or bathing? N  Doing errands, shopping? N  Some recent data might be hidden  No trouble with ADLs at this time  Patient Care Team: Janora Norlander, DO as PCP - General (Family Medicine) Gala Romney, Cristopher Estimable, MD as Consulting Physician (Gastroenterology) Roseanne Kaufman, MD as Consulting Physician (Orthopedic Surgery) Gaynelle Arabian, MD as Consulting Physician (Orthopedic Surgery)    Assessment:   This is a routine wellness examination for [place].  Exercise Activities and Dietary recommendations    Goals    . Exercise 3x per week (30 min per time)     Walk with assistive devices as tolerated. Suggested chair exercises.       Fall Risk Fall Risk  06/19/2017 06/16/2017 05/29/2017 01/27/2017 11/17/2016  Falls in the past year? Yes No No No No  Number falls in past yr: 1 - - - -  Injury with Fall? No - - - -  Comment - - - - -  Risk for fall due to : History of fall(s);Impaired balance/gait - - - -  Follow up - - - - -   Is the patient's home free of loose throw rugs in walkways, pet beds, electrical cords, etc?   yes      Grab bars in the bathroom? yes      Handrails on the stairs?   yes      Adequate lighting?   yes  Timed Get Up and Go performed:   Depression Screen PHQ 2/9 Scores 06/19/2017 06/16/2017 05/29/2017 01/27/2017  PHQ - 2 Score 3 0 0 1  PHQ- 9 Score 8 - - -     Cognitive Function MMSE - Mini Mental  State Exam 06/19/2017 09/20/2015 04/16/2014 04/10/2014  Orientation to time 4 1 - 5  Orientation to Place 5 3 - 5  Registration 3 3 - 3  Attention/ Calculation 0 2 - 3  Recall 0 2 - 3  Language- name 2 objects 2 2 - 2  Language- repeat 1 1 - 1  Language- follow 3 step command 3 3 3  -  Language- read & follow direction 1 1 - 1  Write a sentence 1 1 - 1  Copy design 1 0 - 1  Total score 21 19 - -  Some Memory loss noted      Immunization History  Administered Date(s) Administered  . Influenza Split 10/27/2011  . Influenza, High Dose Seasonal PF 10/22/2015  . Influenza,inj,Quad PF,6+ Mos 12/29/2014  . Pneumococcal Conjugate-13 04/10/2014  . Pneumococcal Polysaccharide-23 05/10/2007  . Td 01/10/1996  . Tdap 04/10/2014  . Zoster 04/10/2014    Qualifies for Shingles Vaccine?Shingrix declined  Screening Tests Health Maintenance  Topic Date Due  . DEXA SCAN  04/09/2016  . INFLUENZA VACCINE  08/09/2017  . TETANUS/TDAP  04/09/2024  . PNA vac Low Risk Adult  Completed    Cancer Screenings: Lung: Low Dose CT Chest recommended if Age 24-80 years, 30 pack-year currently smoking OR have quit w/in 15years. Patient does not qualify. Breast:  Up to date on Mammogram? Yes   Up to date of Bone Density/Dexa? No will schedule an appt Colorectal: Yes  Additional Screenings: Hepatitis C Screening:  Declines at this time      Plan:  Encouraged to decrease the amount of soda intake and to increase water intake.  Encouraged to eat meal before taking medication and to drink plenty of fluids with medication.  Encourage to continue  to exercise even when physical therapy is done.  Encourage to schedule follow up with Dr. Lajuana Ripple and Dexa scan.  Encourage to bring in a copy of Advance directive to be scanned into chart. Explained the importance of shingrix vaccine and dexa scan   I have personally reviewed and noted the following in the patient's chart:   . Medical and social history . Use of  alcohol, tobacco or illicit drugs  . Current medications and supplements . Functional ability and status . Nutritional status . Physical activity . Advanced directives . List of other physicians . Hospitalizations, surgeries, and ER visits in previous 12 months . Vitals . Screenings to include cognitive, depression, and falls . Referrals and appointments  In addition, I have reviewed and discussed with patient certain preventive protocols, quality metrics, and best practice recommendations. A written personalized care plan for preventive services as well as general preventive health recommendations were provided to patient.     Wardell Heath, LPN  [id number]

## 2017-06-20 DIAGNOSIS — M19031 Primary osteoarthritis, right wrist: Secondary | ICD-10-CM | POA: Diagnosis not present

## 2017-06-20 DIAGNOSIS — M1712 Unilateral primary osteoarthritis, left knee: Secondary | ICD-10-CM | POA: Diagnosis not present

## 2017-06-20 DIAGNOSIS — S63502D Unspecified sprain of left wrist, subsequent encounter: Secondary | ICD-10-CM | POA: Diagnosis not present

## 2017-06-20 DIAGNOSIS — J439 Emphysema, unspecified: Secondary | ICD-10-CM | POA: Diagnosis not present

## 2017-06-20 DIAGNOSIS — I10 Essential (primary) hypertension: Secondary | ICD-10-CM | POA: Diagnosis not present

## 2017-06-25 ENCOUNTER — Other Ambulatory Visit: Payer: Self-pay | Admitting: Pediatrics

## 2017-06-25 DIAGNOSIS — M1712 Unilateral primary osteoarthritis, left knee: Secondary | ICD-10-CM | POA: Diagnosis not present

## 2017-06-25 DIAGNOSIS — S63502D Unspecified sprain of left wrist, subsequent encounter: Secondary | ICD-10-CM | POA: Diagnosis not present

## 2017-06-25 DIAGNOSIS — M545 Low back pain: Principal | ICD-10-CM

## 2017-06-25 DIAGNOSIS — G8929 Other chronic pain: Secondary | ICD-10-CM

## 2017-06-25 DIAGNOSIS — M19031 Primary osteoarthritis, right wrist: Secondary | ICD-10-CM | POA: Diagnosis not present

## 2017-06-25 DIAGNOSIS — J439 Emphysema, unspecified: Secondary | ICD-10-CM | POA: Diagnosis not present

## 2017-06-25 DIAGNOSIS — I10 Essential (primary) hypertension: Secondary | ICD-10-CM | POA: Diagnosis not present

## 2017-06-29 DIAGNOSIS — M1712 Unilateral primary osteoarthritis, left knee: Secondary | ICD-10-CM | POA: Diagnosis not present

## 2017-06-29 DIAGNOSIS — I10 Essential (primary) hypertension: Secondary | ICD-10-CM | POA: Diagnosis not present

## 2017-06-29 DIAGNOSIS — M19031 Primary osteoarthritis, right wrist: Secondary | ICD-10-CM | POA: Diagnosis not present

## 2017-06-29 DIAGNOSIS — S63502D Unspecified sprain of left wrist, subsequent encounter: Secondary | ICD-10-CM | POA: Diagnosis not present

## 2017-06-29 DIAGNOSIS — J439 Emphysema, unspecified: Secondary | ICD-10-CM | POA: Diagnosis not present

## 2017-07-02 DIAGNOSIS — S63502D Unspecified sprain of left wrist, subsequent encounter: Secondary | ICD-10-CM | POA: Diagnosis not present

## 2017-07-02 DIAGNOSIS — I10 Essential (primary) hypertension: Secondary | ICD-10-CM | POA: Diagnosis not present

## 2017-07-02 DIAGNOSIS — J439 Emphysema, unspecified: Secondary | ICD-10-CM | POA: Diagnosis not present

## 2017-07-02 DIAGNOSIS — M19031 Primary osteoarthritis, right wrist: Secondary | ICD-10-CM | POA: Diagnosis not present

## 2017-07-02 DIAGNOSIS — M1712 Unilateral primary osteoarthritis, left knee: Secondary | ICD-10-CM | POA: Diagnosis not present

## 2017-07-06 DIAGNOSIS — S63502D Unspecified sprain of left wrist, subsequent encounter: Secondary | ICD-10-CM | POA: Diagnosis not present

## 2017-07-06 DIAGNOSIS — M19031 Primary osteoarthritis, right wrist: Secondary | ICD-10-CM | POA: Diagnosis not present

## 2017-07-06 DIAGNOSIS — I10 Essential (primary) hypertension: Secondary | ICD-10-CM | POA: Diagnosis not present

## 2017-07-06 DIAGNOSIS — J439 Emphysema, unspecified: Secondary | ICD-10-CM | POA: Diagnosis not present

## 2017-07-06 DIAGNOSIS — M1712 Unilateral primary osteoarthritis, left knee: Secondary | ICD-10-CM | POA: Diagnosis not present

## 2017-07-09 DIAGNOSIS — J439 Emphysema, unspecified: Secondary | ICD-10-CM | POA: Diagnosis not present

## 2017-07-09 DIAGNOSIS — M19031 Primary osteoarthritis, right wrist: Secondary | ICD-10-CM | POA: Diagnosis not present

## 2017-07-09 DIAGNOSIS — I10 Essential (primary) hypertension: Secondary | ICD-10-CM | POA: Diagnosis not present

## 2017-07-09 DIAGNOSIS — S63502D Unspecified sprain of left wrist, subsequent encounter: Secondary | ICD-10-CM | POA: Diagnosis not present

## 2017-07-09 DIAGNOSIS — M1712 Unilateral primary osteoarthritis, left knee: Secondary | ICD-10-CM | POA: Diagnosis not present

## 2017-07-11 ENCOUNTER — Other Ambulatory Visit: Payer: Self-pay | Admitting: Pediatrics

## 2017-07-11 DIAGNOSIS — S63502D Unspecified sprain of left wrist, subsequent encounter: Secondary | ICD-10-CM | POA: Diagnosis not present

## 2017-07-11 DIAGNOSIS — J439 Emphysema, unspecified: Secondary | ICD-10-CM | POA: Diagnosis not present

## 2017-07-11 DIAGNOSIS — M1712 Unilateral primary osteoarthritis, left knee: Secondary | ICD-10-CM | POA: Diagnosis not present

## 2017-07-11 DIAGNOSIS — I10 Essential (primary) hypertension: Secondary | ICD-10-CM | POA: Diagnosis not present

## 2017-07-11 DIAGNOSIS — M19031 Primary osteoarthritis, right wrist: Secondary | ICD-10-CM | POA: Diagnosis not present

## 2017-07-16 DIAGNOSIS — M19031 Primary osteoarthritis, right wrist: Secondary | ICD-10-CM | POA: Diagnosis not present

## 2017-07-16 DIAGNOSIS — S63502D Unspecified sprain of left wrist, subsequent encounter: Secondary | ICD-10-CM | POA: Diagnosis not present

## 2017-07-16 DIAGNOSIS — M1712 Unilateral primary osteoarthritis, left knee: Secondary | ICD-10-CM | POA: Diagnosis not present

## 2017-07-16 DIAGNOSIS — I10 Essential (primary) hypertension: Secondary | ICD-10-CM | POA: Diagnosis not present

## 2017-07-16 DIAGNOSIS — J439 Emphysema, unspecified: Secondary | ICD-10-CM | POA: Diagnosis not present

## 2017-07-20 ENCOUNTER — Other Ambulatory Visit: Payer: Self-pay | Admitting: Pediatrics

## 2017-07-20 DIAGNOSIS — S63502D Unspecified sprain of left wrist, subsequent encounter: Secondary | ICD-10-CM | POA: Diagnosis not present

## 2017-07-20 DIAGNOSIS — I10 Essential (primary) hypertension: Secondary | ICD-10-CM | POA: Diagnosis not present

## 2017-07-20 DIAGNOSIS — J439 Emphysema, unspecified: Secondary | ICD-10-CM | POA: Diagnosis not present

## 2017-07-20 DIAGNOSIS — M19031 Primary osteoarthritis, right wrist: Secondary | ICD-10-CM | POA: Diagnosis not present

## 2017-07-20 DIAGNOSIS — M1712 Unilateral primary osteoarthritis, left knee: Secondary | ICD-10-CM | POA: Diagnosis not present

## 2017-07-27 DIAGNOSIS — M19031 Primary osteoarthritis, right wrist: Secondary | ICD-10-CM | POA: Diagnosis not present

## 2017-07-27 DIAGNOSIS — J439 Emphysema, unspecified: Secondary | ICD-10-CM | POA: Diagnosis not present

## 2017-07-27 DIAGNOSIS — M1712 Unilateral primary osteoarthritis, left knee: Secondary | ICD-10-CM | POA: Diagnosis not present

## 2017-07-27 DIAGNOSIS — I10 Essential (primary) hypertension: Secondary | ICD-10-CM | POA: Diagnosis not present

## 2017-07-27 DIAGNOSIS — S63502D Unspecified sprain of left wrist, subsequent encounter: Secondary | ICD-10-CM | POA: Diagnosis not present

## 2017-09-03 ENCOUNTER — Other Ambulatory Visit: Payer: Self-pay | Admitting: Pediatrics

## 2017-09-20 ENCOUNTER — Other Ambulatory Visit: Payer: Self-pay | Admitting: Pediatrics

## 2017-09-20 DIAGNOSIS — G8929 Other chronic pain: Secondary | ICD-10-CM

## 2017-09-20 NOTE — Telephone Encounter (Signed)
Last seen 06/26/17

## 2017-09-22 ENCOUNTER — Other Ambulatory Visit: Payer: Self-pay | Admitting: Family Medicine

## 2017-09-22 DIAGNOSIS — M545 Low back pain: Principal | ICD-10-CM

## 2017-09-22 DIAGNOSIS — G8929 Other chronic pain: Secondary | ICD-10-CM

## 2017-09-24 NOTE — Telephone Encounter (Signed)
Ov 09/28/17

## 2017-09-28 ENCOUNTER — Encounter: Payer: Self-pay | Admitting: Family Medicine

## 2017-09-28 ENCOUNTER — Ambulatory Visit (INDEPENDENT_AMBULATORY_CARE_PROVIDER_SITE_OTHER): Payer: Medicare Other | Admitting: Family Medicine

## 2017-09-28 VITALS — BP 141/81 | HR 88 | Temp 97.9°F | Ht 64.0 in | Wt 180.0 lb

## 2017-09-28 DIAGNOSIS — I951 Orthostatic hypotension: Secondary | ICD-10-CM

## 2017-09-28 DIAGNOSIS — J069 Acute upper respiratory infection, unspecified: Secondary | ICD-10-CM

## 2017-09-28 DIAGNOSIS — M159 Polyosteoarthritis, unspecified: Secondary | ICD-10-CM | POA: Diagnosis not present

## 2017-09-28 MED ORDER — LORATADINE 10 MG PO TABS: 10.0000 mg | ORAL_TABLET | Freq: Every day | ORAL | 11 refills | Status: DC

## 2017-09-28 MED ORDER — BENZONATATE 100 MG PO CAPS: 100.0000 mg | ORAL_CAPSULE | Freq: Three times a day (TID) | ORAL | 0 refills | Status: DC | PRN

## 2017-09-28 NOTE — Progress Notes (Signed)
Subjective: CC: cough PCP: Janora Norlander, DO OVF:IEPPIR [name] is a 84 y.o. female presenting to clinic today for:  1. Cough Patient is brought to the office by her daughter who notes that she has had a cough for about a week now.  She notes that it seems to be gradually getting better but it still occurs intermittently.  Denies any hemoptysis, shortness of breath or wheeze.  No known fevers.  She is had some sinus fullness and head congestion.  She is not currently using any antihistamines or medications for this.  2.  Dizziness Patient reports a long-standing history of dizziness, particularly when she gets up from seated position.  She notes that it seems to be a little bit worse since she has been sick lately.  She has history of frequent falls.  Dizziness is more lightheadedness and not room spinning.  Additionally, she notes that she occasionally has abnormal sensation within her ears.  3.  Polyarthralgia Patient reports a long-standing history of multiple joint pains.  She is not currently taking anything except for gabapentin and Cymbalta.  She is under the impression she was not allowed to take Tylenol or ibuprofen.  No history of evaluation for autoimmune disease.  She also reports dry mouth.  She drinks frequent water.     ROS: Per HPI  Allergies  Allergen Reactions  . Avelox [Moxifloxacin Hcl In Nacl]   . Codeine Nausea And Vomiting   Past Medical History:  Diagnosis Date  . Acute diverticulitis   . Anemia   . Anxiety   . AVM (arteriovenous malformation) of colon   . Cataract   . Depression   . Diverticulosis 10/2009   Pandiverticulosis, per colonoscopy, Dr. Oneida Alar  . DJD (degenerative joint disease)   . Emphysema   . GERD (gastroesophageal reflux disease)   . Hypertension   . Internal hemorrhoids   . Rectal bleeding    Secondary to diverticulosis, hemorrhoids, and/or cecal AVMs  . Wrist fracture     Current Outpatient Medications:  .   acetaminophen (TYLENOL) 500 MG tablet, Take 1-2 tablets (500-1,000 mg total) by mouth daily., Disp: 30 tablet, Rfl: 0 .  albuterol (PROAIR HFA) 108 (90 Base) MCG/ACT inhaler, INHALE 2 PUFFS INTO THE LUNGS EVERY 6 (SIX) HOURS AS NEEDED FOR WHEEZING OR SHORTNESS OF BREATH., Disp: 8.5 Inhaler, Rfl: 4 .  amLODipine (NORVASC) 5 MG tablet, TAKE 1 TABLET BY MOUTH EVERY DAY, Disp: 90 tablet, Rfl: 0 .  DULoxetine (CYMBALTA) 20 MG capsule, TAKE 2 CAPSULES BY MOUTH EVERY DAY, Disp: 180 capsule, Rfl: 0 .  gabapentin (NEURONTIN) 300 MG capsule, TAKE 1 CAPSULE BY MOUTH THREE TIMES A DAY, Disp: 270 capsule, Rfl: 0 .  meclizine (ANTIVERT) 25 MG tablet, TAKE 1 TABLET BY MOUTH TWICE A DAY, Disp: 180 tablet, Rfl: 0 .  risedronate (ACTONEL) 150 MG tablet, TAKE 1 TABLET EVERY 30 DAYS WITH WATER ON EMPTY STOMACH,DO NOT EAT,DRINK OR LIE DOWN FOR NEXT30 MINS, Disp: 3 tablet, Rfl: 1 Social History   Socioeconomic History  . Marital status: Married    Spouse name: Jenny Reichmann  . Number of children: 8  . Years of education: Not on file  . Highest education level: 8th grade  Occupational History  . Occupation: House Wife  Social Needs  . Financial resource strain: Not hard at all  . Food insecurity:    Worry: Never true    Inability: Never true  . Transportation needs:    Medical: No  Non-medical: No  Tobacco Use  . Smoking status: Former Smoker    Packs/day: 1.00    Years: 65.00    Pack years: 65.00    Last attempt to quit: 10/26/2006    Years since quitting: 10.9  . Smokeless tobacco: Never Used  Substance and Sexual Activity  . Alcohol use: No  . Drug use: No  . Sexual activity: Never  Lifestyle  . Physical activity:    Days per week: 3 days    Minutes per session: 60 min  . Stress: Not on file  Relationships  . Social connections:    Talks on phone: More than three times a week    Gets together: More than three times a week    Attends religious service: Never    Active member of club or  organization: No    Attends meetings of clubs or organizations: Never    Relationship status: Married  . Intimate partner violence:    Fear of current or ex partner: No    Emotionally abused: No    Physically abused: No    Forced sexual activity: No  Other Topics Concern  . Not on file  Social History Narrative  . Not on file   Family History  Problem Relation Age of Onset  . Asthma Mother   . Cancer Sister        stomach  . Heart attack Brother   . Epilepsy Brother   . Colon cancer Neg Hx     Objective: Office vital signs reviewed. BP (!) 141/81   Pulse 88   Temp 97.9 F (36.6 C) (Oral)   Ht 5\' 4"  (1.626 m)   Wt 180 lb (81.6 kg)   SpO2 97%   BMI 30.90 kg/m   Physical Examination:  General: Awake, alert, well nourished, nontoxic. No acute distress HEENT: Normal    Neck: No masses palpated. No lymphadenopathy    Ears: Tympanic membranes intact, normal light reflex, no erythema, no bulging    Eyes: PERRLA, extraocular membranes intact, sclera white    Nose: nasal turbinates moist, clear nasal discharge    Throat: moist mucus membranes, no erythema, no tonsillar exudate.  Airway is patent Cardio: regular rate and rhythm, S1S2 heard, no murmurs appreciated Pulm: clear to auscultation bilaterally, no wheezes, rhonchi or rales; normal work of breathing on room air Extremities: warm, well perfused, No edema, cyanosis or clubbing; +2 pulses bilaterally MSK: unsteady gait and normal station  Orthostatic VS for the past 24 hrs (Last 3 readings):  BP- Lying Pulse- Lying BP- Sitting Pulse- Sitting BP- Standing at 0 minutes Pulse- Standing at 0 minutes  09/28/17 1430 143/88 80 141/81 88 125/80 93   Assessment/ Plan: 84 y.o. female   1. Orthostatic hypotension Noted to have an almost 20 point drop with rising from seated to standing positions.  She is certainly symptomatic with this positional change.  Will check BMP and CBC for completion.  However, I suspect that this is  related to aging and decreased vascular compliance.  I have prescribed her compression hose and provide her hand written prescription during today's visit.  Handout provided with instructions to help manage orthostatic hypotension. - Basic Metabolic Panel - CBC with Differential  2. URI with cough and congestion No evidence of bacterial infection on exam.  She is a febrile and well-appearing.  Supportive care recommended.  Continue hydration.  Tessalon Perles prescribed as needed cough.  Claritin given as well for sinus symptoms.  Return precautions  discussed.  Both she and her daughter voiced good understanding will follow-up as needed. - Basic Metabolic Panel - CBC with Differential  3. Osteoarthritis involving multiple joints on both sides of body Long-standing issue for patient.  I see no previous autoimmune work-up.  Given that she has associated dry mouth, will evaluate for possible autoimmune component.  For now, I see no reason why she cannot use Tylenol arthritis.  I did discuss avoidance of oral NSAIDs for now.  Follow-up in 3 months or sooner if needed. - Rheumatoid factor - ANA w/Reflex if Positive - C-reactive protein - Sedimentation Rate - Basic Metabolic Panel - CBC with Differential   Orders Placed This Encounter  Procedures  . Rheumatoid factor  . ANA w/Reflex if Positive  . C-reactive protein  . Sedimentation Rate  . Basic Metabolic Panel  . CBC with Differential   Meds ordered this encounter  Medications  . loratadine (CLARITIN) 10 MG tablet    Sig: Take 1 tablet (10 mg total) by mouth daily.    Dispense:  30 tablet    Refill:  11  . benzonatate (TESSALON PERLES) 100 MG capsule    Sig: Take 1 capsule (100 mg total) by mouth 3 (three) times daily as needed.    Dispense:  20 capsule    Refill:  [place], DO [place] [phone]

## 2017-09-28 NOTE — Patient Instructions (Signed)
I have sent in a cough capsule for her to use up to 3 times daily if needed for cough.  She may use Tylenol for her joints if needed.  I am checking a couple of autoimmune labs for rheumatoid arthritis.  I should have these results back sometime next week and will contact you.  I think her dizziness is coming from something called orthostatic hypotension.  This is when blood pressure drops when you change positions.  I have prescribed her compression hose to see if this may help keep the blood flow closer to the brain and reduce sensation of dizziness.  I have also sent in Claritin to help with allergy symptoms.  Orthostatic Hypotension Orthostatic hypotension is a sudden drop in blood pressure that happens when you quickly change positions, such as when you get up from a seated or lying position. Blood pressure is a measurement of how strongly, or weakly, your blood is pressing against the walls of your arteries. Arteries are blood vessels that carry blood from your heart throughout your body. When blood pressure is too low, you may not get enough blood to your brain or to the rest of your organs. This can cause weakness, light-headedness, rapid heartbeat, and fainting. This can last for just a few seconds or for up to a few minutes. Orthostatic hypotension is usually not a serious problem. However, if it happens frequently or gets worse, it may be a sign of something more serious. What are the causes? This condition may be caused by:  Sudden changes in posture, such as standing up quickly after you have been sitting or lying down.  Blood loss.  Loss of body fluids (dehydration).  Heart problems.  Hormone (endocrine) problems.  Pregnancy.  Severe infection.  Lack of certain nutrients.  Severe allergic reactions (anaphylaxis).  Certain medicines, such as blood pressure medicine or medicines that make the body lose excess fluids (diuretics). Sometimes, this condition can be caused by not  taking medicine as directed, such as taking too much of a certain medicine.  What increases the risk? Certain factors can make you more likely to develop orthostatic hypotension, including:  Age. Risk increases as you get older.  Conditions that affect the heart or the central nervous system.  Taking certain medicines, such as blood pressure medicine or diuretics.  Being pregnant.  What are the signs or symptoms? Symptoms of this condition may include:  Weakness.  Light-headedness.  Dizziness.  Blurred vision.  Fatigue.  Rapid heartbeat.  Fainting, in severe cases.  How is this diagnosed? This condition is diagnosed based on:  Your medical history.  Your symptoms.  Your blood pressure measurement. Your health care provider will check your blood pressure when you are: ? Lying down. ? Sitting. ? Standing.  A blood pressure reading is recorded as two numbers, such as "120 over 80" (or 120/80). The first ("top") number is called the systolic pressure. It is a measure of the pressure in your arteries as your heart beats. The second ("bottom") number is called the diastolic pressure. It is a measure of the pressure in your arteries when your heart relaxes between beats. Blood pressure is measured in a unit called mm Hg. Healthy blood pressure for adults is 120/80. If your blood pressure is below 90/60, you may be diagnosed with hypotension. Other information or tests that may be used to diagnose orthostatic hypotension include:  Your other vital signs, such as your heart rate and temperature.  Blood tests.  Tilt  table test. For this test, you will be safely secured to a table that moves you from a lying position to an upright position. Your heart rhythm and blood pressure will be monitored during the test.  How is this treated? Treatment for this condition may include:  Changing your diet. This may involve eating more salt (sodium) or drinking more water.  Taking  medicines to raise your blood pressure.  Changing the dosage of certain medicines you are taking that might be lowering your blood pressure.  Wearing compression stockings. These stockings help to prevent blood clots and reduce swelling in your legs.  In some cases, you may need to go to the hospital for:  Fluid replacement. This means you will receive fluids through an IV tube.  Blood replacement. This means you will receive donated blood through an IV tube (transfusion).  Treating an infection or heart problems, if this applies.  Monitoring. You may need to be monitored while medicines that you are taking wear off.  Follow these instructions at home: Eating and drinking   Drink enough fluid to keep your urine clear or pale yellow.  Eat a healthy diet and follow instructions from your health care provider about eating or drinking restrictions. A healthy diet includes: ? Fresh fruits and vegetables. ? Whole grains. ? Lean meats. ? Low-fat dairy products.  Eat extra salt only as directed. Do not add extra salt to your diet unless your health care provider told you to do that.  Eat frequent, small meals.  Avoid standing up suddenly after eating. Medicines  Take over-the-counter and prescription medicines only as told by your health care provider. ? Follow instructions from your health care provider about changing the dosage of your current medicines, if this applies. ? Do not stop or adjust any of your medicines on your own. General instructions  Wear compression stockings as told by your health care provider.  Get up slowly from lying down or sitting positions. This gives your blood pressure a chance to adjust.  Avoid hot showers and excessive heat as directed by your health care provider.  Return to your normal activities as told by your health care provider. Ask your health care provider what activities are safe for you.  Do not use any products that contain nicotine  or tobacco, such as cigarettes and e-cigarettes. If you need help quitting, ask your health care provider.  Keep all follow-up visits as told by your health care provider. This is important. Contact a health care provider if:  You vomit.  You have diarrhea.  You have a fever for more than 2-3 days.  You feel more thirsty than usual.  You feel weak and tired. Get help right away if:  You have chest pain.  You have a fast or irregular heartbeat.  You develop numbness in any part of your body.  You cannot move your arms or your legs.  You have trouble speaking.  You become sweaty or feel lightheaded.  You faint.  You feel short of breath.  You have trouble staying awake.  You feel confused. This information is not intended to replace advice given to you by your health care provider. Make sure you discuss any questions you have with your health care provider. Document Released: 12/16/2001 Document Revised: 09/14/2015 Document Reviewed: 06/18/2015 Elsevier Interactive Patient Education  2018 [employer].

## 2017-09-29 LAB — CBC WITH DIFFERENTIAL/PLATELET
Basophils Absolute: 0 10*3/uL (ref 0.0–0.2)
Basos: 0 %
EOS (ABSOLUTE): 0.1 10*3/uL (ref 0.0–0.4)
Eos: 2 %
HEMOGLOBIN: 14.8 g/dL (ref 11.1–15.9)
Hematocrit: 45 % (ref 34.0–46.6)
IMMATURE GRANULOCYTES: 0 %
Immature Grans (Abs): 0 10*3/uL (ref 0.0–0.1)
Lymphocytes Absolute: 1.7 10*3/uL (ref 0.7–3.1)
Lymphs: 20 %
MCH: 29.7 pg (ref 26.6–33.0)
MCHC: 32.9 g/dL (ref 31.5–35.7)
MCV: 90 fL (ref 79–97)
MONOCYTES: 9 %
Monocytes Absolute: 0.8 10*3/uL (ref 0.1–0.9)
NEUTROS ABS: 6 10*3/uL (ref 1.4–7.0)
NEUTROS PCT: 69 %
PLATELETS: 427 10*3/uL (ref 150–450)
RBC: 4.98 x10E6/uL (ref 3.77–5.28)
RDW: 13.8 % (ref 12.3–15.4)
WBC: 8.7 10*3/uL (ref 3.4–10.8)

## 2017-09-29 LAB — BASIC METABOLIC PANEL
BUN/Creatinine Ratio: 7 — ABNORMAL LOW (ref 12–28)
BUN: 8 mg/dL (ref 8–27)
CALCIUM: 9.6 mg/dL (ref 8.7–10.3)
CHLORIDE: 97 mmol/L (ref 96–106)
CO2: 24 mmol/L (ref 20–29)
Creatinine, Ser: 1.08 mg/dL — ABNORMAL HIGH (ref 0.57–1.00)
GFR calc Af Amer: 53 mL/min/{1.73_m2} — ABNORMAL LOW (ref >59)
GFR calc non Af Amer: 46 mL/min/{1.73_m2} — ABNORMAL LOW (ref >59)
Glucose: 86 mg/dL (ref 65–99)
POTASSIUM: 3.7 mmol/L (ref 3.5–5.2)
Sodium: 139 mmol/L (ref 134–144)

## 2017-09-29 LAB — RHEUMATOID FACTOR: Rhuematoid fact SerPl-aCnc: 12.1 IU/mL (ref 0.0–13.9)

## 2017-09-29 LAB — C-REACTIVE PROTEIN: CRP: 3 mg/L (ref 0–10)

## 2017-09-29 LAB — SEDIMENTATION RATE: SED RATE: 14 mm/h (ref 0–40)

## 2017-09-29 LAB — ANA W/REFLEX IF POSITIVE: Anti Nuclear Antibody(ANA): NEGATIVE

## 2017-10-13 ENCOUNTER — Other Ambulatory Visit: Payer: Self-pay | Admitting: Family Medicine

## 2017-10-19 ENCOUNTER — Telehealth: Payer: Self-pay | Admitting: Family Medicine

## 2017-10-19 NOTE — Telephone Encounter (Signed)
Patients daughter notified of lab results and verbalized understanding

## 2017-10-28 ENCOUNTER — Other Ambulatory Visit: Payer: Self-pay | Admitting: Family Medicine

## 2017-10-29 ENCOUNTER — Ambulatory Visit (INDEPENDENT_AMBULATORY_CARE_PROVIDER_SITE_OTHER): Payer: Medicare Other

## 2017-10-29 DIAGNOSIS — Z23 Encounter for immunization: Secondary | ICD-10-CM | POA: Diagnosis not present

## 2017-11-24 ENCOUNTER — Encounter: Payer: Self-pay | Admitting: Physician Assistant

## 2017-11-24 ENCOUNTER — Ambulatory Visit (INDEPENDENT_AMBULATORY_CARE_PROVIDER_SITE_OTHER): Payer: Medicare Other | Admitting: Physician Assistant

## 2017-11-24 VITALS — BP 123/75 | HR 114 | Temp 97.0°F | Ht 64.0 in | Wt 180.0 lb

## 2017-11-24 DIAGNOSIS — S20212A Contusion of left front wall of thorax, initial encounter: Secondary | ICD-10-CM | POA: Diagnosis not present

## 2017-11-24 DIAGNOSIS — M25512 Pain in left shoulder: Secondary | ICD-10-CM | POA: Diagnosis not present

## 2017-11-24 DIAGNOSIS — F320 Major depressive disorder, single episode, mild: Secondary | ICD-10-CM

## 2017-11-24 MED ORDER — CITALOPRAM HYDROBROMIDE 10 MG PO TABS: 10.0000 mg | ORAL_TABLET | Freq: Every day | ORAL | 5 refills | Status: DC

## 2017-11-24 MED ORDER — NAPROXEN 375 MG PO TABS: 375.0000 mg | ORAL_TABLET | Freq: Two times a day (BID) | ORAL | 0 refills | Status: AC

## 2017-11-26 ENCOUNTER — Ambulatory Visit (INDEPENDENT_AMBULATORY_CARE_PROVIDER_SITE_OTHER): Payer: Medicare Other | Admitting: Family Medicine

## 2017-11-26 ENCOUNTER — Encounter: Payer: Self-pay | Admitting: Family Medicine

## 2017-11-26 VITALS — BP 126/70 | HR 106 | Temp 97.4°F | Ht 64.0 in | Wt 179.0 lb

## 2017-11-26 DIAGNOSIS — R296 Repeated falls: Secondary | ICD-10-CM | POA: Diagnosis not present

## 2017-11-26 DIAGNOSIS — F4321 Adjustment disorder with depressed mood: Secondary | ICD-10-CM

## 2017-11-26 DIAGNOSIS — R5381 Other malaise: Secondary | ICD-10-CM | POA: Diagnosis not present
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
## 2017-11-26 NOTE — Patient Instructions (Signed)
We discussed the risk of serotonin syndrome between citalopram and duloxetine.  I recommend that you discontinue the citalopram.  No need for taper since she is only been on this for the last 2 to 3 days.  If you find that her mood is worsening, I do want you to have her return for reevaluation.  We discussed referral for grief counseling but you declined this.  I do recommend that she seek some type of counseling to deal with the passing of her husband and son.   Complicated Grieving Grief is a normal response to the death of someone close to you. Feelings of fear, anger, and guilt can affect almost everyone who loses a loved one. It is also common to have symptoms of depression while you are grieving. These include problems with sleep, loss of appetite, and lack of energy. They may last for weeks or months after a loss. Complicated grief is different from normal grief or depression. Normal grieving involves sadness and feelings of loss, but these feelings are not constant. Complicated grief is a constant and severe type of grief. It interferes with your ability to function normally. It may last for several months to a year or longer. Complicated grief may require treatment from a mental health care provider. What are the causes? It is not known why some people continue to struggle with grief and others do not. You may be at higher risk for complicated grief if:  The death of your loved one was sudden or unexpected.  The death of your loved one was due to a violent event.  Your loved one committed suicide.  Your loved one was a child or a young person.  You were very close to or dependent on the loved one.  You have a history of depression.  What are the signs or symptoms? Signs and symptoms of complicated grief may include:  Feeling disbelief or numbness.  Being unable to enjoy good memories of your loved one.  Needing to avoid anything that reminds you of your loved one.  Being  unable to stop thinking about the death.  Feeling intense anger or guilt.  Feeling alone and hopeless.  Feeling that your life is meaningless and empty.  Losing the desire to live.  How is this diagnosed? Your health care provider may diagnose complicated grief if:  You have constant symptoms of grief for 6-12 months or longer.  Your symptoms are interfering with your ability to live your life.  Your health care provider may want you to see a mental health care provider. Many symptoms of depression are similar to the symptoms of complicated grief. It is important to be evaluated for complicated grief along with other mental health conditions. How is this treated? Talk therapy with a mental health provider is the most common treatment for complicated grief. During therapy, you will learn healthy ways to cope with the loss of your loved one. In some cases, your mental health care provider may also recommend antidepressant medicines. Follow these instructions at home:  Take care of yourself. ? Eat regular meals and maintain a healthy diet. Eat plenty of fruits, vegetables, and whole grains. ? Try to get some exercise each day. ? Keep regular hours for sleep. Try to get at least 8 hours of sleep each night.  Do not use drugs or alcohol to ease your symptoms.  Take medicines only as directed by your health care provider.  Spend time with friends and loved ones.  Consider  joining a grief (bereavement) support group to help you deal with your loss.  Keep all follow-up visits as directed by your health care provider. This is important. Contact a health care provider if:  Your symptoms keep you from functioning normally.  Your symptoms do not get better with treatment. Get help right away if:  You have serious thoughts of hurting yourself or someone else.  You have suicidal feelings. This information is not intended to replace advice given to you by your health care provider.  Make sure you discuss any questions you have with your health care provider. Document Released: 12/26/2004 Document Revised: 06/03/2015 Document Reviewed: 06/05/2013 Elsevier Interactive Patient Education  [employer].

## 2017-11-26 NOTE — Progress Notes (Signed)
Subjective: CC: Grieving/fall PCP: Janora Norlander, DO YTK:[id number] [name] is a 84 y.o. female presenting to clinic today for:  1.  Grieving Patient is brought to the office by her daughter, who notes that she has been grieving over the loss of her husband, who passed away 3 weeks ago.  She also had a loss of her son a few weeks prior to that.  She notes that she has been sitting around in bed quite often.  Patient denies any depressive symptoms and states that she "has been doing quite well".  Celexa was added at last visit several days ago.  She has been taking this once daily as prescribed in conjunction with Cymbalta.  No SI, HI.  She has not sought counseling services because she has a niece at home who can provide these and is certified in this if needed.  2.  Frequent falls Patient sustained a fall about a week ago and was seen in clinic last week.  She was prescribed Naprosyn for pain.  She does report some continued pain on the left side of the rib cage in the left low back.  Pain seems to be exacerbated by coughing.  Her daughter wants to know if we can order home physical therapy again for her due to frequent falls.  She notes that the physical therapy did improve her ability to get around and reduced falls when she had it previously.   ROS: Per HPI  Allergies  Allergen Reactions  . Avelox [Moxifloxacin Hcl In Nacl]   . Codeine Nausea And Vomiting   Past Medical History:  Diagnosis Date  . Acute diverticulitis   . Anemia   . Anxiety   . AVM (arteriovenous malformation) of colon   . Cataract   . Depression   . Diverticulosis 10/2009   Pandiverticulosis, per colonoscopy, Dr. Oneida Alar  . DJD (degenerative joint disease)   . Emphysema   . GERD (gastroesophageal reflux disease)   . Hypertension   . Internal hemorrhoids   . Rectal bleeding    Secondary to diverticulosis, hemorrhoids, and/or cecal AVMs  . Wrist fracture     Current Outpatient Medications:  .   acetaminophen (TYLENOL) 500 MG tablet, Take 1-2 tablets (500-1,000 mg total) by mouth daily., Disp: 30 tablet, Rfl: 0 .  albuterol (PROAIR HFA) 108 (90 Base) MCG/ACT inhaler, INHALE 2 PUFFS INTO THE LUNGS EVERY 6 (SIX) HOURS AS NEEDED FOR WHEEZING OR SHORTNESS OF BREATH., Disp: 8.5 Inhaler, Rfl: 4 .  amLODipine (NORVASC) 5 MG tablet, TAKE 1 TABLET BY MOUTH EVERY DAY, Disp: 90 tablet, Rfl: 1 .  citalopram (CELEXA) 10 MG tablet, Take 1 tablet (10 mg total) by mouth daily., Disp: 30 tablet, Rfl: 5 .  DULoxetine (CYMBALTA) 20 MG capsule, TAKE 2 CAPSULES BY MOUTH EVERY DAY, Disp: 180 capsule, Rfl: 0 .  gabapentin (NEURONTIN) 300 MG capsule, TAKE 1 CAPSULE BY MOUTH THREE TIMES A DAY, Disp: 270 capsule, Rfl: 0 .  meclizine (ANTIVERT) 25 MG tablet, TAKE 1 TABLET BY MOUTH TWICE A DAY, Disp: 180 tablet, Rfl: 0 .  naproxen (NAPROSYN) 375 MG tablet, Take 1 tablet (375 mg total) by mouth 2 (two) times daily with a meal., Disp: 20 tablet, Rfl: 0 .  risedronate (ACTONEL) 150 MG tablet, TAKE 1 TABLET EVERY 30 DAYS WITH WATER ON EMPTY STOMACH,DO NOT EAT,DRINK OR LIE DOWN FOR NEXT30 MINS, Disp: 3 tablet, Rfl: 1 Social History   Socioeconomic History  . Marital status: Married    Spouse  name: Jenny Reichmann  . Number of children: 8  . Years of education: Not on file  . Highest education level: 8th grade  Occupational History  . Occupation: House Wife  Social Needs  . Financial resource strain: Not hard at all  . Food insecurity:    Worry: Never true    Inability: Never true  . Transportation needs:    Medical: No    Non-medical: No  Tobacco Use  . Smoking status: Former Smoker    Packs/day: 1.00    Years: 65.00    Pack years: 65.00    Last attempt to quit: 10/26/2006    Years since quitting: 11.0  . Smokeless tobacco: Never Used  Substance and Sexual Activity  . Alcohol use: No  . Drug use: No  . Sexual activity: Never  Lifestyle  . Physical activity:    Days per week: 3 days    Minutes per session: 60  min  . Stress: Not on file  Relationships  . Social connections:    Talks on phone: More than three times a week    Gets together: More than three times a week    Attends religious service: Never    Active member of club or organization: No    Attends meetings of clubs or organizations: Never    Relationship status: Married  . Intimate partner violence:    Fear of current or ex partner: No    Emotionally abused: No    Physically abused: No    Forced sexual activity: No  Other Topics Concern  . Not on file  Social History Narrative  . Not on file   Family History  Problem Relation Age of Onset  . Asthma Mother   . Cancer Sister        stomach  . Heart attack Brother   . Epilepsy Brother   . Colon cancer Neg Hx     Objective: Office vital signs reviewed. BP 126/70   Pulse (!) 106   Temp (!) 97.4 F (36.3 C) (Oral)   Ht 5\' 4"  (1.626 m)   Wt 179 lb (81.2 kg)   BMI 30.73 kg/m   Physical Examination:  General: Awake, alert, well nourished, No acute distress Pulm: normal work of breathing on room air Chest: Tenderness to palpation along the lateral left side of the chest.  No palpable bony abnormalities or discoloration noted. Extremities: warm, well perfused, No edema, cyanosis or clubbing; +2 pulses bilaterally MSK: slow gait; requires minimal assistance for ambulation.she has full active range of motion of bilateral upper extremities.  Generalized physical deconditioning noted Neuro: Light touch sensation grossly intact. Psych: Mood stable, speech normal, affect appropriate, pleasant and interactive. Depression screen [facility] 2/9 11/26/2017 11/24/2017 09/28/2017  Decreased Interest 2 0 0  Down, Depressed, Hopeless 2 1 0  PHQ - 2 Score 4 1 0  Altered sleeping 2 - -  Tired, decreased energy 3 - -  Change in appetite 1 - -  Feeling bad or failure about yourself  0 - -  Trouble concentrating 0 - -  Moving slowly or fidgety/restless 1 - -  Suicidal thoughts 0 - -  PHQ-9  Score 11 - -  Difficult doing work/chores Somewhat difficult - -  Some recent data might be hidden   No flowsheet data found.  Assessment/ Plan: 84 y.o. female   1. Physical deconditioning Referral placed to home health.  We discussed that if insurance will not allow for home health physical therapy, we  will plan for referral to physical therapy next-door.  Though, this may be quite difficult to schedule/coordinate with family.  Patient does not drive and is unable to leave the home without assistance. - Home Health - Face-to-face encounter (required for Medicare/Medicaid patients)  2. Frequent falls Would benefit from balance training and strengthening. - Home Health - Face-to-face encounter (required for Medicare/Medicaid patients)  3. Grief We discussed that there is a possible interaction between the SNRI and SSRI.  Discontinue Celexa.  Continue Cymbalta.  Monitor for signs and symptoms of severe depression.  We discussed that her mild depressive symptoms are normal.  I do not think that extra medication at this time is needed.  I do recommend grief counseling however.  This was offered as a referral but this was declined by the family.  Patient has a family member who is certified in psychology/therapy and she will seek their counsel if needed.   Orders Placed This Encounter  Procedures  . Home Health    Order Specific Question:   To provide the following care/treatments    Answer:   PT    Order Specific Question:   To provide the following care/treatments    Answer:   [place]  . Face-to-face encounter (required for Medicare/Medicaid patients)    I Ronnie Doss certify that this patient is under my care and that I, or a nurse practitioner or physician's assistant working with me, had a face-to-face encounter that meets the physician face-to-face encounter requirements with this patient on 11/26/2017. The encounter with the patient was in whole, or in part for the  following medical condition(s) which is the primary reason for home health care (List medical condition): Generalized physical deconditioning, frequent falls.    Order Specific Question:   The encounter with the patient was in whole, or in part, for the following medical condition, which is the primary reason for home health care    Answer:   generalized physical deconditioning, frequent falls    Order Specific Question:   I certify that, based on my findings, the following services are medically necessary home health services    Answer:   Physical therapy    Order Specific Question:   Reason for Medically Necessary Home Health Services    Answer:   Therapy- Personnel officer, Adult nurse Specific Question:   Reason for Medically Necessary Home Health Services    Answer:   Therapy- Home Adaptation to Facilitate Safety    Order Specific Question:   Reason for Medically Necessary Home Health Services    Answer:   Therapy- Therapeutic Exercises to Increase Strength and Endurance    Order Specific Question:   My clinical findings support the need for the above services    Answer:   Pain interferes with ambulation/mobility    Order Specific Question:   Further, I certify that my clinical findings support that this patient is homebound due to:    Answer:   Unable to leave home safely without assistance   No orders of the defined types were placed in this encounter.    Janora Norlander, DO [place] [phone]

## 2018-01-15 ENCOUNTER — Encounter: Payer: Self-pay | Admitting: Family Medicine

## 2018-01-15 ENCOUNTER — Ambulatory Visit (INDEPENDENT_AMBULATORY_CARE_PROVIDER_SITE_OTHER): Payer: Medicare Other | Admitting: Family Medicine

## 2018-01-15 ENCOUNTER — Ambulatory Visit (INDEPENDENT_AMBULATORY_CARE_PROVIDER_SITE_OTHER): Payer: Medicare Other

## 2018-01-15 VITALS — BP 135/66 | HR 94 | Temp 97.3°F | Ht 64.0 in | Wt 170.0 lb

## 2018-01-15 DIAGNOSIS — R7989 Other specified abnormal findings of blood chemistry: Secondary | ICD-10-CM

## 2018-01-15 DIAGNOSIS — R296 Repeated falls: Secondary | ICD-10-CM

## 2018-01-15 DIAGNOSIS — M545 Low back pain: Secondary | ICD-10-CM

## 2018-01-15 DIAGNOSIS — G8929 Other chronic pain: Secondary | ICD-10-CM

## 2018-01-15 DIAGNOSIS — R1030 Lower abdominal pain, unspecified: Secondary | ICD-10-CM | POA: Diagnosis not present

## 2018-01-15 DIAGNOSIS — R52 Pain, unspecified: Secondary | ICD-10-CM

## 2018-01-15 DIAGNOSIS — S79912A Unspecified injury of left hip, initial encounter: Secondary | ICD-10-CM | POA: Diagnosis not present

## 2018-01-15 DIAGNOSIS — M25552 Pain in left hip: Secondary | ICD-10-CM | POA: Diagnosis not present

## 2018-01-15 MED ORDER — GABAPENTIN 300 MG PO CAPS: ORAL_CAPSULE | ORAL | 1 refills | Status: AC

## 2018-01-15 NOTE — Progress Notes (Signed)
Subjective: CC: Fall/ L hip pain PCP: Janora Norlander, DO ZOX:WRUEAV P Hannay is a 85 y.o. female presenting to clinic today for:  1.  Frequent falls Patient with recurrent falls.  She has sustained a fall the day following her last visit in November.  She continues to have a left-sided hip pain that radiates to the groin.  Denies any sensation changes.  She does feel that the pain is exacerbated by ambulation and certain movements of the left lower extremity.  Her family reports that home health physical therapy never contacted the house.   ROS: Per HPI  Allergies  Allergen Reactions  . Avelox [Moxifloxacin Hcl In Nacl]   . Codeine Nausea And Vomiting   Past Medical History:  Diagnosis Date  . Acute diverticulitis   . Anemia   . Anxiety   . AVM (arteriovenous malformation) of colon   . Cataract   . Depression   . Diverticulosis 10/2009   Pandiverticulosis, per colonoscopy, Dr. Oneida Alar  . DJD (degenerative joint disease)   . Emphysema   . GERD (gastroesophageal reflux disease)   . Hypertension   . Internal hemorrhoids   . Rectal bleeding    Secondary to diverticulosis, hemorrhoids, and/or cecal AVMs  . Wrist fracture     Current Outpatient Medications:  .  acetaminophen (TYLENOL) 500 MG tablet, Take 1-2 tablets (500-1,000 mg total) by mouth daily., Disp: 30 tablet, Rfl: 0 .  albuterol (PROAIR HFA) 108 (90 Base) MCG/ACT inhaler, INHALE 2 PUFFS INTO THE LUNGS EVERY 6 (SIX) HOURS AS NEEDED FOR WHEEZING OR SHORTNESS OF BREATH., Disp: 8.5 Inhaler, Rfl: 4 .  amLODipine (NORVASC) 5 MG tablet, TAKE 1 TABLET BY MOUTH EVERY DAY, Disp: 90 tablet, Rfl: 1 .  DULoxetine (CYMBALTA) 20 MG capsule, TAKE 2 CAPSULES BY MOUTH EVERY DAY, Disp: 180 capsule, Rfl: 0 .  gabapentin (NEURONTIN) 300 MG capsule, TAKE 1 CAPSULE BY MOUTH THREE TIMES A DAY, Disp: 270 capsule, Rfl: 0 .  meclizine (ANTIVERT) 25 MG tablet, TAKE 1 TABLET BY MOUTH TWICE A DAY, Disp: 180 tablet, Rfl: 0 .  naproxen  (NAPROSYN) 375 MG tablet, Take 1 tablet (375 mg total) by mouth 2 (two) times daily with a meal., Disp: 20 tablet, Rfl: 0 .  risedronate (ACTONEL) 150 MG tablet, TAKE 1 TABLET EVERY 30 DAYS WITH WATER ON EMPTY STOMACH,DO NOT EAT,DRINK OR LIE DOWN FOR NEXT30 MINS, Disp: 3 tablet, Rfl: 1 Social History   Socioeconomic History  . Marital status: Married    Spouse name: Jenny Reichmann  . Number of children: 8  . Years of education: Not on file  . Highest education level: 8th grade  Occupational History  . Occupation: House Wife  Social Needs  . Financial resource strain: Not hard at all  . Food insecurity:    Worry: Never true    Inability: Never true  . Transportation needs:    Medical: No    Non-medical: No  Tobacco Use  . Smoking status: Former Smoker    Packs/day: 1.00    Years: 65.00    Pack years: 65.00    Last attempt to quit: 10/26/2006    Years since quitting: 11.2  . Smokeless tobacco: Never Used  Substance and Sexual Activity  . Alcohol use: No  . Drug use: No  . Sexual activity: Never  Lifestyle  . Physical activity:    Days per week: 3 days    Minutes per session: 60 min  . Stress: Not on file  Relationships  .  Social connections:    Talks on phone: More than three times a week    Gets together: More than three times a week    Attends religious service: Never    Active member of club or organization: No    Attends meetings of clubs or organizations: Never    Relationship status: Married  . Intimate partner violence:    Fear of current or ex partner: No    Emotionally abused: No    Physically abused: No    Forced sexual activity: No  Other Topics Concern  . Not on file  Social History Narrative  . Not on file   Family History  Problem Relation Age of Onset  . Asthma Mother   . Cancer Sister        stomach  . Heart attack Brother   . Epilepsy Brother   . Colon cancer Neg Hx     Objective: Office vital signs reviewed. BP 135/66   Pulse 94   Temp (!)  97.3 F (36.3 C) (Oral)   Ht 5\' 4"  (1.626 m)   Wt 170 lb (77.1 kg)   BMI 29.18 kg/m   Physical Examination:  General: Awake, alert, well nourished, No acute distress Pulm: normal work of breathing on room air Extremities: warm, well perfused, No edema, cyanosis or clubbing; +2 pulses bilaterally MSK: slow gait; Generalized physical deconditioning noted  Left hip: Patient has limited active range of motion in internal and external rotation.  She has pain with both the Neer and FABER.  No tenderness to palpation over the trochanteric bursa.  No palpable deformities. Neuro: Light touch sensation grossly intact. Depression screen [facility] 2/9 11/26/2017 11/24/2017 09/28/2017  Decreased Interest 2 0 0  Down, Depressed, Hopeless 2 1 0  PHQ - 2 Score 4 1 0  Altered sleeping 2 - -  Tired, decreased energy 3 - -  Change in appetite 1 - -  Feeling bad or failure about yourself  0 - -  Trouble concentrating 0 - -  Moving slowly or fidgety/restless 1 - -  Suicidal thoughts 0 - -  PHQ-9 Score 11 - -  Difficult doing work/chores Somewhat difficult - -  Some recent data might be hidden   Dg Hip Unilat W Or W/o Pelvis 2-3 Views Left  Result Date: 01/15/2018 CLINICAL DATA:  Left groin pain from fall. EXAM: DG HIP (WITH OR WITHOUT PELVIS) 2-3V LEFT COMPARISON:  CT 11/15/2014. FINDINGS: Degenerate changes lumbar spine and both hips. No acute bony or joint abnormality identified. No evidence of fracture or dislocation. IMPRESSION: Degenerative changes lumbar spine and both hips. No acute abnormality identified. Electronically Signed   By: Marcello Moores  Register   On: 01/15/2018 10:32   Assessment/ Plan: 85 y.o. female   1. Left hip pain X-ray was obtained and upon personal review demonstrated no evidence of dislocation or fracture.  Radiologist review confirms this.  She does have degenerative changes within the lumbar spine and bilateral hips.  I have referred her to orthopedic surgery for consideration of hip  injections.  Outpatient physical therapy referral also placed today. - DG HIP UNILAT W OR W/O PELVIS 2-3 VIEWS LEFT; Future - Ambulatory referral to Orthopedic Surgery - Ambulatory referral to Physical Therapy  2. Frequent falls Needs to improve strength.  She is deconditioned.  Check for metabolic causes. - Basic Metabolic Panel - CBC - TSH - Ambulatory referral to Physical Therapy  3. Elevated serum creatinine - Basic Metabolic Panel  4. Chronic midline  low back pain - gabapentin (NEURONTIN) 300 MG capsule; TAKE 1 CAPSULE BY MOUTH THREE TIMES A DAY  Dispense: 270 capsule; Refill: 1 - Ambulatory referral to Physical Therapy   Orders Placed This Encounter  Procedures  . DG HIP UNILAT W OR W/O PELVIS 2-3 VIEWS LEFT    Standing Status:   Future    Number of Occurrences:   1    Standing Expiration Date:   03/16/2019    Order Specific Question:   Reason for Exam (SYMPTOM  OR DIAGNOSIS REQUIRED)    Answer:   fall    Order Specific Question:   Preferred imaging location?    Answer:   Internal  . Basic Metabolic Panel  . CBC  . TSH   Meds ordered this encounter  Medications  . gabapentin (NEURONTIN) 300 MG capsule    Sig: TAKE 1 CAPSULE BY MOUTH THREE TIMES A DAY    Dispense:  270 capsule    Refill:  [place], DO [place] [phone]

## 2018-01-15 NOTE — Patient Instructions (Signed)
You had labs performed today.  You will be contacted with the results of the labs once they are available, usually in the next 3 business days for routine lab work.   

## 2018-01-22 ENCOUNTER — Ambulatory Visit: Payer: Medicare Other | Attending: Family Medicine | Admitting: Physical Therapy

## 2018-01-22 ENCOUNTER — Encounter: Payer: Self-pay | Admitting: Physical Therapy

## 2018-01-22 ENCOUNTER — Other Ambulatory Visit: Payer: Self-pay

## 2018-01-22 ENCOUNTER — Other Ambulatory Visit: Payer: Self-pay | Admitting: Family Medicine

## 2018-01-22 DIAGNOSIS — G8929 Other chronic pain: Secondary | ICD-10-CM

## 2018-01-22 DIAGNOSIS — M545 Low back pain, unspecified: Secondary | ICD-10-CM

## 2018-01-22 DIAGNOSIS — R262 Difficulty in walking, not elsewhere classified: Secondary | ICD-10-CM | POA: Diagnosis not present

## 2018-01-22 DIAGNOSIS — M25552 Pain in left hip: Secondary | ICD-10-CM | POA: Insufficient documentation

## 2018-01-22 DIAGNOSIS — M6281 Muscle weakness (generalized): Secondary | ICD-10-CM | POA: Insufficient documentation

## 2018-01-22 NOTE — Therapy (Signed)
[facility] [place], [place], [address] Phone: [phone]   Fax:  [phone]  Physical Therapy Evaluation  Patient Details  Name: [name] MRN: [id number] Date of Birth: [birth year] Referring Provider (PT): Ronnie Doss, DO   Encounter Date: 01/22/2018  PT End of Session - 01/22/18 1039    Visit Number  1    Number of Visits  12    Date for PT Re-Evaluation  03/05/18    Authorization Type  Progress note every 10th visit    PT Start Time  0956    PT Stop Time  1035    PT Time Calculation (min)  39 min    Activity Tolerance  Patient limited by pain    Behavior During Therapy  Anxious       Past Medical History:  Diagnosis Date  . Acute diverticulitis   . Anemia   . Anxiety   . AVM (arteriovenous malformation) of colon   . Cataract   . Depression   . Diverticulosis 10/2009   Pandiverticulosis, per colonoscopy, Dr. Oneida Alar  . DJD (degenerative joint disease)   . Emphysema   . GERD (gastroesophageal reflux disease)   . Hypertension   . Internal hemorrhoids   . Rectal bleeding    Secondary to diverticulosis, hemorrhoids, and/or cecal AVMs  . Wrist fracture     Past Surgical History:  Procedure Laterality Date  . CATARACT EXTRACTION Bilateral 2017  . COLONOSCOPY  Oct 2011   Dr. Oneida Alar: pancolonic diverticulosis, 2 cecal AVMs s/p ablation, retained stool in left colon, moderate internal hemorrhoids  . ESOPHAGOGASTRODUODENOSCOPY Left 08/10/2012   Procedure: ESOPHAGOGASTRODUODENOSCOPY (EGD);  Surgeon: Daneil Dolin, MD;  [facility];  Service: Endoscopy;  Laterality: Left;  EGD for abdominal pain and GIB  . Right total knee arthroplasty     X2    There were no vitals filed for this visit.   Subjective Assessment - 01/22/18 1108    Subjective  Patient arrives to physical therapy with daughter with reports of left hip pain, low back pain, and difficulty walking. Patient reported pain is  ongoing but exacerbated when she slid out of her daughter's SUV in November 2018. Patient denied falling to the ground. Patient reported falling early January when removing clothes from the dryer. She stated she turned and fell into the corner and slid onto the ground. Patient reports difficulties with all ADLs because of pain. Patient lives alone but has family support who visits daily to assist her with her needs. She also reports a family member stays with her at night. Patient expressed fear with PT due to increasing pain and is fearful of moving because she is anticipating pain. Patient reported her pain at worst is 9/10 in both L hip and low back. Pain at best is 6-7/10. Patient's goals are to decrease pain, improve walking, and improve strength.    Patient is accompained by:  Family member   daughter   Pertinent History  Frequent falls, HTN, COPD, vertigo per daughter    Limitations  Walking;House hold activities;Standing;Sitting    Currently in Pain?  Yes    Pain Score  6     Pain Location  Hip    Pain Orientation  Left;Posterior;Lateral    Pain Descriptors / Indicators  Sore;Sharp;Aching;Discomfort    Pain Type  Chronic pain    Pain Radiating Towards  lateral thigh to lateral knee    Pain Onset  More than a month ago  Pain Frequency  Constant    Aggravating Factors   movement    Pain Relieving Factors  medication    Effect of Pain on Daily Activities  walking, ADLs, and home activities         [facility] PT Assessment - 01/22/18 0001      Assessment   Medical Diagnosis  Left hip pain, Frequent falls, Chronic midline low back pain    Referring Provider (PT)  Ronnie Doss, DO    Onset Date/Surgical Date  --   ongoing, exacerbation November 2018.   Next MD Visit  Unsure    Prior Therapy  No      Precautions   Precautions  Fall      Restrictions   Weight Bearing Restrictions  No      Balance Screen   Has the patient fallen in the past 6 months  Yes    How many times?  2     Has the patient had a decrease in activity level because of a fear of falling?   Yes    Is the patient reluctant to leave their home because of a fear of falling?   Yes      [place]  Private residence    Living Arrangements  Alone;Other (Comment)   Family close by and frequently visits     Prior Function   Level of Independence  Independent with basic ADLs;Needs assistance with homemaking   assistance with home activities from family     Posture/Postural Control   Posture/Postural Control  Postural limitations    Postural Limitations  Rounded Shoulders;Forward head;Increased thoracic kyphosis;Flexed trunk      ROM / Strength   AROM / PROM / Strength  AROM;Strength      AROM   Overall AROM Comments  grossly assessed in bilateral hips, decrease in ROM with associated pain; fearful to move      Strength   Overall Strength Comments  LE MMT grossly assessed bilaterally 3/5, pain with movements      Palpation   Palpation comment  Very tender to palpation to bilateral lumbar paraspinals, along lumbar spinous processes, left upper gluteal, left lateral hip and left ITB      Transfers   Five time sit to stand comments   36 seconds modified with UE support    Comments  definite use of UE for support and balance but good sequencing and technique      Ambulation/Gait   Gait Pattern  Step-through pattern;Decreased step length - right;Decreased stride length;Decreased stance time - left;Decreased hip/knee flexion - left;Decreased weight shift to left;Antalgic;Trendelenburg;Lateral hip instability;Trunk flexed;Wide base of support    Gait Comments  hand held assist from daughter      Standardized Balance Assessment   Standardized Balance Assessment  Berg Balance Test      Berg Balance Test   Sit to Stand  Able to stand using hands after several tries    Standing Unsupported  Able to stand 2 minutes with supervision    Sitting with Back Unsupported but Feet  Supported on Floor or Stool  Able to sit safely and securely 2 minutes    Stand to Sit  Controls descent by using hands    Transfers  Able to transfer with verbal cueing and /or supervision    Standing Unsupported with Eyes Closed  Able to stand 10 seconds with supervision  Objective measurements completed on examination: See above findings.              PT Education - 01/22/18 1111    Education Details  draw ins, hip abduction, pillow squeeze    Person(s) Educated  Patient;Child(ren)    Methods  Explanation;Demonstration;Handout    Comprehension  Returned demonstration;Verbalized understanding          PT Long Term Goals - 01/22/18 1055      PT LONG TERM GOAL #1   Title  Patient will be independent with HEP    Time  6    Period  Weeks    Status  New      PT LONG TERM GOAL #2   Title  Patient will report ability to perform ADLs with left hip pain and low back pain less than or equal to 6/10.    Time  6    Period  Weeks    Status  New      PT LONG TERM GOAL #3   Title  Patient will improve functional LE strength as seen by the ability to perform 5x sit to stand in 30 seconds or less.    Time  6    Period  Weeks    Status  New             Plan - 01/22/18 1044    Clinical Impression Statement  Patient is an 85 year old female who presents to physical therapy with left hip pain, bilateral low back pain, decreased functional LE strength and balance as seen by the modified 5x sit to stand test. Patient became very anxious during assessment portion of evaluation and expressed fear of pain. Patient's Berg Balance was not completed due to pain while standing as well as fear of falling. Patient very tender to palpation in bilateral low back paraspinals, spinous processes of lumbar spine, upper gluteals bilaterally, and lateral left hip to distal lateral thigh. Patient ambulates with hand held assist from daughter with decreased left stance  time, decreased stride length, flexed trunk, and trendeleburg gait. Patient and patient's daughter were recommended to use the walker for safety. Patient reported understanding. Patient and patient's daughter educated on "motion is lotion" and importance of finding balance with movement to lubricate joints to decrease pain. Patient and patient's daughter reported understanding. Patient would benefit from skilled physical therapy to address deficits, improve balance and walking, and to address patient's goals.    History and Personal Factors relevant to plan of care:  frequent falls, fear of pain and anticipation of pain with movment, HTN, COPD    Clinical Presentation  Evolving    Clinical Presentation due to:  frequent falls, high pain levels    Clinical Decision Making  Moderate    Rehab Potential  Good    PT Frequency  2x / week    PT Duration  6 weeks    PT Treatment/Interventions  ADLs/Self Care Home Management;Moist Heat;Electrical Stimulation;Cryotherapy;Gait training;Stair training;Functional mobility training;Therapeutic exercise;Balance training;Therapeutic activities;Neuromuscular re-education;Manual techniques;Passive range of motion;Patient/family education    PT Next Visit Plan  Complete Berg Balance scale, assess LE MMT. Pt very anxious private treatment room if possible. Route to [place] to add Merrilee Jansky goals if completed.    PT Home Exercise Plan  see patient education section    Consulted and Agree with Plan of Care  Patient;Family member/caregiver    Family Member Consulted  Daughter       Patient will benefit from skilled therapeutic intervention in  order to improve the following deficits and impairments:  Pain, Abnormal gait, Difficulty walking, Decreased balance, Decreased strength, Decreased range of motion, Decreased endurance, Decreased activity tolerance, Postural dysfunction  Visit Diagnosis: Pain in left hip  Chronic midline low back pain, unspecified whether sciatica  present  Difficulty in walking, not elsewhere classified  Muscle weakness (generalized)     Problem List Patient Active Problem List   Diagnosis Date Noted  . Impaired mobility and ADLs 05/29/2017  . Frequent falls 05/29/2017  . Osteoarthritis involving multiple joints on both sides of body 01/27/2017  . Low back pain 10/23/2015  . Osteopenia 09/20/2015  . Osteoarthritis of knee 12/29/2014  . Essential hypertension 05/20/2014  . Depression 05/01/2014  . AVM (arteriovenous malformation) of colon   . Pulmonary emphysema ([place]) 10/26/2011  . Anemia 10/26/2011  . Anxiety 10/26/2011  . GERD (gastroesophageal reflux disease) 10/26/2011  . Diverticulosis 10/09/2009   Gabriela Eves, PT, DPT 01/22/2018, 12:11 PM  [facility] [address] [place], [place], [address] Phone: [phone]   Fax:  [phone]  Name: [name] MRN: [id number] Date of Birth: 1928-06-27

## 2018-01-25 ENCOUNTER — Telehealth: Payer: Self-pay | Admitting: Radiology

## 2018-01-25 ENCOUNTER — Ambulatory Visit: Payer: Medicare Other | Admitting: *Deleted

## 2018-01-25 ENCOUNTER — Ambulatory Visit (INDEPENDENT_AMBULATORY_CARE_PROVIDER_SITE_OTHER): Payer: Medicare Other | Admitting: Orthopedic Surgery

## 2018-01-25 ENCOUNTER — Ambulatory Visit (INDEPENDENT_AMBULATORY_CARE_PROVIDER_SITE_OTHER): Payer: Medicare Other

## 2018-01-25 VITALS — BP 130/65 | HR 86 | Ht 67.0 in | Wt 167.0 lb

## 2018-01-25 DIAGNOSIS — M1612 Unilateral primary osteoarthritis, left hip: Secondary | ICD-10-CM | POA: Diagnosis not present

## 2018-01-25 DIAGNOSIS — M5442 Lumbago with sciatica, left side: Secondary | ICD-10-CM | POA: Diagnosis not present

## 2018-01-25 DIAGNOSIS — G8929 Other chronic pain: Secondary | ICD-10-CM

## 2018-01-25 DIAGNOSIS — M545 Low back pain: Secondary | ICD-10-CM

## 2018-01-25 MED ORDER — PREDNISOLONE 5 MG (48) PO TBPK: 5.0000 mg | ORAL_TABLET | ORAL | 1 refills | Status: AC

## 2018-01-25 MED ORDER — METHYLPREDNISOLONE ACETATE 40 MG/ML IJ SUSP
40.0000 mg | Freq: Once | INTRAMUSCULAR | Status: AC
  Administered 2018-01-25: 40 mg via INTRAMUSCULAR

## 2018-01-25 NOTE — Patient Instructions (Addendum)
PLAN: (Rx., injectx, surgery, frx, mri/ct) JOINT INJECTION LEFT HIP JOINT AT HOSPITAL IM INJECTION LEFT HIP   CONTINUE PT X 6 WEEKS  CONTINUE GABAPENTIN ADD PREDNISONE DOSE PACK 5 MG WALKER

## 2018-01-25 NOTE — Progress Notes (Signed)
NEW PATIENT OFFICE VISIT  Chief Complaint  Patient presents with  . Hip Pain    Left hip pain, referred by A. Gottshalk.    This is an 85 year old female referred from [facility] with a history of worsening lower back pain and hip pain including groin pain since November 2019.  The patient is with a family member who says she was getting her out of an SUV she felt something pull in her back in November and has had trouble and discomfort since then but it has worsened over the last month or so.  This includes back pain and groin pain and some thigh pain despite gabapentin which was prescribed by [facility]ened [facility].  They indicate that the physician there sent them here to get a hip injection.  She started physical therapy last week  She is having severe pain in the left buttock radiating into the left thigh this includes groin pain she is had 2 falls 1 couple of weeks ago.  She denies any numbness or tingling in the lower extremities seems to do worse with sitting and has trouble walking   Review of Systems  Constitutional: Negative for fever, malaise/fatigue and weight loss.  Musculoskeletal: Positive for back pain and joint pain.  Neurological: Negative for tingling.     Past Medical History:  Diagnosis Date  . Acute diverticulitis   . Anemia   . Anxiety   . AVM (arteriovenous malformation) of colon   . Cataract   . Depression   . Diverticulosis 10/2009   Pandiverticulosis, per colonoscopy, Dr. Oneida Alar  . DJD (degenerative joint disease)   . Emphysema   . GERD (gastroesophageal reflux disease)   . Hypertension   . Internal hemorrhoids   . Rectal bleeding    Secondary to diverticulosis, hemorrhoids, and/or cecal AVMs  . Wrist fracture     Past Surgical History:  Procedure Laterality Date  . CATARACT EXTRACTION Bilateral 2017  . COLONOSCOPY  Oct 2011   Dr. Oneida Alar: pancolonic diverticulosis, 2 cecal AVMs s/p ablation, retained stool in left  colon, moderate internal hemorrhoids  . ESOPHAGOGASTRODUODENOSCOPY Left 08/10/2012   Procedure: ESOPHAGOGASTRODUODENOSCOPY (EGD);  Surgeon: Daneil Dolin, MD;  [facility];  Service: Endoscopy;  Laterality: Left;  EGD for abdominal pain and GIB  . Right total knee arthroplasty     X2    Family History  Problem Relation Age of Onset  . Asthma Mother   . Cancer Sister        stomach  . Heart attack Brother   . Epilepsy Brother   . Colon cancer Neg Hx    Social History   Tobacco Use  . Smoking status: Former Smoker    Packs/day: 1.00    Years: 65.00    Pack years: 65.00    Last attempt to quit: 10/26/2006    Years since quitting: 11.2  . Smokeless tobacco: Never Used  Substance Use Topics  . Alcohol use: No  . Drug use: No    Allergies  Allergen Reactions  . Avelox [Moxifloxacin Hcl In Nacl]   . Codeine Nausea And Vomiting    No outpatient medications have been marked as taking for the 01/25/18 encounter (Office Visit) with Carole Civil, MD.   Current Facility-Administered Medications for the 01/25/18 encounter (Office Visit) with Carole Civil, MD  Medication  . methylPREDNISolone acetate (DEPO-MEDROL) injection 40 mg    BP 130/65   Pulse 86   Ht 5'  7" (1.702 m)   Wt 167 lb (75.8 kg)   BMI 26.16 kg/m   Physical Exam Vitals signs and nursing note reviewed.  Constitutional:      General: She is not in acute distress.    Appearance: She is well-developed. She is not ill-appearing, toxic-appearing or diaphoretic.  HENT:     Head: Normocephalic and atraumatic.     Right Ear: External ear normal.     Left Ear: External ear normal.     Nose: Nose normal.     Mouth/Throat:     Mouth: Mucous membranes are moist.     Pharynx: No oropharyngeal exudate.  Eyes:     General: No scleral icterus.       Right eye: No discharge.        Left eye: No discharge.     Extraocular Movements: Extraocular movements intact.     Conjunctiva/sclera:  Conjunctivae normal.     Pupils: Pupils are equal, round, and reactive to light.  Neck:     Musculoskeletal: Normal range of motion and neck supple.     Thyroid: No thyromegaly.     Vascular: No JVD.     Trachea: No tracheal deviation.  Cardiovascular:     Rate and Rhythm: Normal rate.     Chest Wall: PMI is not displaced.     Pulses: Normal pulses.  Pulmonary:     Effort: Pulmonary effort is normal. No respiratory distress.     Breath sounds: No stridor. No wheezing.  Abdominal:     General: Bowel sounds are normal. There is no distension.     Palpations: Abdomen is soft. There is no mass.     Tenderness: There is no abdominal tenderness. There is no rebound.  Musculoskeletal:     Right lower leg: No edema.     Left lower leg: No edema.  Lymphadenopathy:     Cervical: No cervical adenopathy.     Lower Body: No right inguinal adenopathy. No left inguinal adenopathy.  Skin:    General: Skin is warm and dry.     Capillary Refill: Capillary refill takes less than 2 seconds.     Coloration: Skin is not jaundiced.     Findings: No ecchymosis or rash. Rash is not nodular or papular.     Nails: There is no clubbing.   Neurological:     General: No focal deficit present.     Mental Status: She is alert and oriented to person, place, and time.     Cranial Nerves: No cranial nerve deficit.     Sensory: No sensory deficit.     Motor: No abnormal muscle tone.     Coordination: Coordination abnormal. Heel to Shin Test abnormal.     Gait: Gait abnormal.     Deep Tendon Reflexes: Reflexes normal. Babinski sign absent on the right side. Babinski sign absent on the left side.     Reflex Scores:      Patellar reflexes are 0 on the right side and 0 on the left side.      Achilles reflexes are 0 on the right side and 0 on the left side. Psychiatric:        Mood and Affect: Mood normal.        Behavior: Behavior normal.        Thought Content: Thought content normal.        Judgment:  Judgment normal.     Ortho Exam  Lumbar spine exam shows tenderness  in the lower back she is really restricted in her overall range of motion there is no instability there the skin is intact.  The thoracic and cervical spine are nontender  The lower extremities show painful range of motion of the left hip decreased internal rotation with no instability normal muscle tone with no tremor no strength deficit is noted on the left side the skin is intact as well peripherally her pulses are good there is no edema she has no lymphadenopathy in the groin sensation is normal in the left leg with negative straight leg raise  On the right side limb alignment is normal limb length is normal range of motion is normal stability tests were normal strength and muscle tone excellent skin intact no edema negative lymphadenopathy in the groin sensory exam normal  MEDICAL DECISION SECTION  Xrays were done at [facility] and are in pacs  Pelvis and left hip  X-ray shows grade 2 arthritis in the left hip grade 1 in the right hip  Report  CLINICAL DATA:  Left groin pain from fall.   EXAM: DG HIP (WITH OR WITHOUT PELVIS) 2-3V LEFT   COMPARISON:  CT 11/15/2014.   FINDINGS: Degenerate changes lumbar spine and both hips. No acute bony or joint abnormality identified. No evidence of fracture or dislocation.   IMPRESSION: Degenerative changes lumbar spine and both hips. No acute abnormality identified.     Electronically Signed   By: Marcello Moores  Register   On: 01/15/2018 10:32   I ordered a lumbar series in the office Encounter Diagnoses  Name Primary?  . Chronic bilateral low back pain, unspecified whether sciatica present Yes  . Primary osteoarthritis of left hip     PLAN: (Rx., injectx, surgery, frx, mri/ct) JOINT INJECTION LEFT HIP JOINT AT HOSPITAL IM INJECTION LEFT HIP   CONTINUE PT X 6 WEEKS  CONTINUE GABAPENTIN ADD PREDNISONE DOSE PACK 5 MG   Meds ordered this encounter   Medications  . prednisoLONE 5 MG (48) TBPK    Sig: Take 5 mg by mouth as directed for 12 days.    Dispense:  48 each    Refill:  1  . methylPREDNISolone acetate (DEPO-MEDROL) injection 40 mg    Arther Abbott, MD  01/25/2018 9:23 AM

## 2018-01-25 NOTE — Telephone Encounter (Signed)
Call to schedule left hip injection

## 2018-01-29 ENCOUNTER — Ambulatory Visit: Payer: Medicare Other | Admitting: Physical Therapy

## 2018-01-29 DIAGNOSIS — M6281 Muscle weakness (generalized): Secondary | ICD-10-CM

## 2018-01-29 DIAGNOSIS — R262 Difficulty in walking, not elsewhere classified: Secondary | ICD-10-CM

## 2018-01-29 DIAGNOSIS — M545 Low back pain, unspecified: Secondary | ICD-10-CM

## 2018-01-29 DIAGNOSIS — G8929 Other chronic pain: Secondary | ICD-10-CM | POA: Diagnosis not present

## 2018-01-29 DIAGNOSIS — M25552 Pain in left hip: Secondary | ICD-10-CM | POA: Diagnosis not present

## 2018-01-29 NOTE — Telephone Encounter (Signed)
Friday Jan 24th  9am left hip injection left message for patient to call back so I can advise of appt.

## 2018-01-29 NOTE — Therapy (Signed)
[facility] [place], [place], [address] Phone: [phone]   Fax:  [phone]  Physical Therapy Treatment  Patient Details  Name: [name] MRN: [id number] Date of Birth: [birth year] Referring Provider (PT): Ronnie Doss, DO   Encounter Date: 01/29/2018  PT End of Session - 01/29/18 1145    Visit Number  2    Number of Visits  12    Date for PT Re-Evaluation  03/05/18    Authorization Type  Progress note every 10th visit    PT Start Time  0945    PT Stop Time  1030   2 units for frequent rest breaks due to pain   PT Time Calculation (min)  45 min    Equipment Utilized During Treatment  Other (comment)   rolling walker   Activity Tolerance  Patient limited by pain    Behavior During Therapy  Anxious       Past Medical History:  Diagnosis Date  . Acute diverticulitis   . Anemia   . Anxiety   . AVM (arteriovenous malformation) of colon   . Cataract   . Depression   . Diverticulosis 10/2009   Pandiverticulosis, per colonoscopy, Dr. Oneida Alar  . DJD (degenerative joint disease)   . Emphysema   . GERD (gastroesophageal reflux disease)   . Hypertension   . Internal hemorrhoids   . Rectal bleeding    Secondary to diverticulosis, hemorrhoids, and/or cecal AVMs  . Wrist fracture     Past Surgical History:  Procedure Laterality Date  . CATARACT EXTRACTION Bilateral 2017  . COLONOSCOPY  Oct 2011   Dr. Oneida Alar: pancolonic diverticulosis, 2 cecal AVMs s/p ablation, retained stool in left colon, moderate internal hemorrhoids  . ESOPHAGOGASTRODUODENOSCOPY Left 08/10/2012   Procedure: ESOPHAGOGASTRODUODENOSCOPY (EGD);  Surgeon: Daneil Dolin, MD;  Location: [facility];  Service: Endoscopy;  Laterality: Left;  EGD for abdominal pain and GIB  . Right total knee arthroplasty     X2    There were no vitals filed for this visit.  Subjective Assessment - 01/29/18 1146    Subjective  Patient reported having a  cortisone injection shot in her left hip; still has pain and has noticed an improvement.    Pertinent History  Frequent falls, HTN, COPD, vertigo per daughter    Limitations  Walking;House hold activities;Standing;Sitting    Currently in Pain?  Yes    Pain Score  9     Pain Location  Hip    Pain Orientation  Left;Posterior;Lateral    Pain Descriptors / Indicators  Sore;Tender;Discomfort;Sharp    Pain Type  Chronic pain    Pain Onset  More than a month ago    Pain Frequency  Constant         [facility] PT Assessment - 01/29/18 0001      Berg Balance Test   Sit to Stand  Able to stand using hands after several tries    Standing Unsupported  Able to stand 2 minutes with supervision    Sitting with Back Unsupported but Feet Supported on Floor or Stool  Able to sit safely and securely 2 minutes    Stand to Sit  Controls descent by using hands    Transfers  Able to transfer with verbal cueing and /or supervision    Standing Unsupported with Eyes Closed  Able to stand 10 seconds with supervision    Standing Ubsupported with Feet Together  Able to place feet together independently and  stand for 1 minute with supervision    From Standing, Reach Forward with Outstretched Arm  Can reach forward >12 cm safely (5")    From Standing Position, Pick up Object from [place] to pick up shoe, needs supervision    From Standing Position, Turn to Look Behind Over each Shoulder  Turn sideways only but maintains balance    Turn 360 Degrees  Able to turn 360 degrees safely but slowly    Standing Unsupported, Alternately Place Feet on Step/Stool  Needs assistance to keep from falling or unable to try    Standing Unsupported, One Foot in [place] help to step but can hold 15 seconds    Standing on One Leg  Unable to try or needs assist to prevent fall    Total Score  31                   [facility] Adult PT Treatment/Exercise - 01/29/18 0001      Ambulation/Gait   Ambulation Distance (Feet)  167  Feet    Assistive device  Rolling walker    Gait Pattern  Step-through pattern;Decreased stride length;Trunk flexed;Wide base of support      Exercises   Exercises  Knee/Hip;Lumbar      Lumbar Exercises: Stretches   Single Knee to Chest Stretch  Right;Left;2 reps;30 seconds    Lower Trunk Rotation  2 reps;30 seconds      Lumbar Exercises: Supine   Pelvic Tilt  1 rep;10 reps;5 seconds    Glut Set  10 reps;3 seconds      Manual Therapy   Manual Therapy  Soft tissue mobilization    Soft tissue mobilization  Very gentle STW/M to left low back and glute to decrease pain and prevent muscle guarding                  PT Long Term Goals - 01/22/18 1055      PT LONG TERM GOAL #1   Title  Patient will be independent with HEP    Time  6    Period  Weeks    Status  New      PT LONG TERM GOAL #2   Title  Patient will report ability to perform ADLs with left hip pain and low back pain less than or equal to 6/10.    Time  6    Period  Weeks    Status  New      PT LONG TERM GOAL #3   Title  Patient will improve functional LE strength as seen by the ability to perform 5x sit to stand in 30 seconds or less.    Time  6    Period  Weeks    Status  New            Plan - 01/29/18 1147    Clinical Impression Statement  Patient was able to tolerate treatment fair. Patient was highly anxious and was anticipating pain throughout session therefore frequent rest breaks were required. Throughout rest breaks, patient educated on motion is lotion and that soreness after today's visit is normal. Patient discussed using going out to visit nephew and family members as motiviation to keep moving. Patient demonstrated improved gait mechanics when ambulating with a rolling walker but patient still prefers to walk without an AD despite education. Patient was able to perform exercises with guidance from PT and provided tactile cues for form and technique.     Clinical  Presentation  Evolving     Clinical Decision Making  Moderate    Rehab Potential  Good    PT Frequency  2x / week    PT Duration  6 weeks    PT Treatment/Interventions  ADLs/Self Care Home Management;Moist Heat;Electrical Stimulation;Cryotherapy;Gait training;Stair training;Functional mobility training;Therapeutic exercise;Balance training;Therapeutic activities;Neuromuscular re-education;Manual techniques;Passive range of motion;Patient/family education    PT Next Visit Plan  Review HEP, very gentle exercises for lumbar and LEs, very gentle STW/M. Pt very anxious private treatment room if possible. modalites if tolerable    Consulted and Agree with Plan of Care  Patient    Family Member Consulted  Daughter       Patient will benefit from skilled therapeutic intervention in order to improve the following deficits and impairments:  Pain, Abnormal gait, Difficulty walking, Decreased balance, Decreased strength, Decreased range of motion, Decreased endurance, Decreased activity tolerance, Postural dysfunction  Visit Diagnosis: Pain in left hip  Chronic midline low back pain, unspecified whether sciatica present  Muscle weakness (generalized)  Difficulty in walking, not elsewhere classified     Problem List Patient Active Problem List   Diagnosis Date Noted  . Impaired mobility and ADLs 05/29/2017  . Frequent falls 05/29/2017  . Osteoarthritis involving multiple joints on both sides of body 01/27/2017  . Low back pain 10/23/2015  . Osteopenia 09/20/2015  . Osteoarthritis of knee 12/29/2014  . Essential hypertension 05/20/2014  . Depression 05/01/2014  . AVM (arteriovenous malformation) of colon   . Pulmonary emphysema ([place]) 10/26/2011  . Anemia 10/26/2011  . Anxiety 10/26/2011  . GERD (gastroesophageal reflux disease) 10/26/2011  . Diverticulosis 10/09/2009    Gabriela Eves, PT, DPT 01/29/2018, 11:56 AM  [facility] [address] [place], [place],  [address] Phone: [phone]   Fax:  [phone]  Name: [name] MRN: [id number] Date of Birth: [birth year]

## 2018-02-01 ENCOUNTER — Ambulatory Visit (HOSPITAL_COMMUNITY)
Admission: RE | Admit: 2018-02-01 | Discharge: 2018-02-01 | Disposition: A | Discharge status: Home / Self Care | Payer: Medicare Other | Source: Ambulatory Visit | Attending: Orthopedic Surgery | Admitting: Orthopedic Surgery

## 2018-02-01 ENCOUNTER — Encounter (HOSPITAL_COMMUNITY): Payer: Self-pay

## 2018-02-01 DIAGNOSIS — M25552 Pain in left hip: Secondary | ICD-10-CM | POA: Insufficient documentation

## 2018-02-01 DIAGNOSIS — G8929 Other chronic pain: Secondary | ICD-10-CM | POA: Diagnosis not present

## 2018-02-01 DIAGNOSIS — M545 Low back pain: Secondary | ICD-10-CM | POA: Diagnosis not present

## 2018-02-01 DIAGNOSIS — M1612 Unilateral primary osteoarthritis, left hip: Secondary | ICD-10-CM | POA: Diagnosis not present

## 2018-02-01 MED ORDER — METHYLPREDNISOLONE ACETATE 40 MG/ML IJ SUSP
INTRAMUSCULAR | Status: AC
  Administered 2018-02-01: 40 mg
  Filled 2018-02-01: qty 1

## 2018-02-01 MED ORDER — IOPAMIDOL (ISOVUE-300) INJECTION 61%
INTRAVENOUS | Status: AC
  Administered 2018-02-01: 2 mL
  Filled 2018-02-01: qty 50

## 2018-02-01 MED ORDER — POVIDONE-IODINE 10 % EX SOLN
CUTANEOUS | Status: AC
  Administered 2018-02-01: 1
  Filled 2018-02-01: qty 15

## 2018-02-01 MED ORDER — LIDOCAINE HCL (PF) 1 % IJ SOLN
INTRAMUSCULAR | Status: AC
  Administered 2018-02-01: 2 mL
  Filled 2018-02-01: qty 10

## 2018-02-01 NOTE — Procedures (Signed)
Preprocedure Dx: LEFT hip pain Postprocedure Dx: LEFT hip pain Procedure  Fluoroscopically guided therapeutic LEFT hip joint injection Radiologist:  Thornton Papas Anesthesia:  5 ml of 1% lidocaine Injectate:  2 ml Isovue-300, followed by 40 mg Depo-Medrol, 2 ml 1% lidocaine Fluoro time:  0 minutes 42 seconds EBL:   None Complications: None

## 2018-02-05 ENCOUNTER — Ambulatory Visit: Payer: Medicare Other | Admitting: Physical Therapy

## 2018-02-14 ENCOUNTER — Ambulatory Visit: Payer: Medicare Other | Admitting: Physical Therapy

## 2018-03-08 ENCOUNTER — Ambulatory Visit (INDEPENDENT_AMBULATORY_CARE_PROVIDER_SITE_OTHER): Payer: Medicare Other | Admitting: Orthopedic Surgery

## 2018-03-08 VITALS — BP 106/60 | HR 81 | Ht 67.0 in | Wt 163.0 lb

## 2018-03-08 DIAGNOSIS — G8929 Other chronic pain: Secondary | ICD-10-CM

## 2018-03-08 DIAGNOSIS — M545 Low back pain, unspecified: Secondary | ICD-10-CM

## 2018-03-08 DIAGNOSIS — M1612 Unilateral primary osteoarthritis, left hip: Secondary | ICD-10-CM

## 2018-03-08 NOTE — Addendum Note (Signed)
Addended byCandice Camp on: 03/08/2018 10:54 AM   Modules accepted: Orders

## 2018-03-08 NOTE — Progress Notes (Signed)
Progress Note   Patient ID: [name], female   DOB: March 15, 1928, 85 y.o.   MRN: [id number]   Chief Complaint  Patient presents with  . Follow-up    Recheck back pain.    85 year old female with osteoarthritis left hip status post injection left hip joint did well with temporary pain relief  She also has lower back pain with some radicular symptoms in her left leg  She says that therapy made her worse and she thinks she can do it at home  She complains of pain and so many places she cannot isolate the primary source at this point    Review of Systems  Constitutional:       No pathologic changes in bowel or bladder function weight loss fever night sweats     Allergies  Allergen Reactions  . Avelox [Moxifloxacin Hcl In Nacl]   . Codeine Nausea And Vomiting     BP 106/60   Pulse 81   Ht 5\' 7"  (1.702 m)   Wt 163 lb (73.9 kg)   BMI 25.53 kg/m   Physical Exam Vitals signs and nursing note reviewed. Exam conducted with a chaperone present.  Constitutional:      General: She is not in acute distress.    Appearance: Normal appearance. She is not ill-appearing or toxic-appearing.  Musculoskeletal:       Legs:  Neurological:     Mental Status: She is alert and oriented to person, place, and time.     Sensory: Sensation is intact.     Motor: No weakness, tremor, atrophy or abnormal muscle tone.     Gait: Gait is intact.  Psychiatric:        Mood and Affect: Mood is depressed.        Speech: Speech normal.        Behavior: Behavior normal.        Thought Content: Thought content normal.        Cognition and Memory: Cognition normal.      Medical decisions:   Data  Imaging:   No new imaging  Encounter Diagnoses  Name Primary?  . Chronic bilateral low back pain, unspecified whether sciatica present Yes  . Primary osteoarthritis of left hip     PLAN:   Recommend topical medications for pain with her back she can also use a heating pad and  Tylenol  Every 6 month hip injections if necessary  Order home therapy if not improved will continue outpatient therapy  No follow-up needed patient is semi-compliant and no surgery is indicated at this time    Arther Abbott, MD 03/08/2018 10:26 AM

## 2018-03-08 NOTE — Patient Instructions (Signed)
Use Tylenol as needed for pain can use topical medication such as BenGay or Aspercreme and heating pad for the back pain  We will order home physical therapy if is declined we will let you know and set up outpatient therapy

## 2018-03-14 ENCOUNTER — Encounter: Payer: Self-pay | Admitting: Family Medicine

## 2018-03-14 ENCOUNTER — Ambulatory Visit (INDEPENDENT_AMBULATORY_CARE_PROVIDER_SITE_OTHER): Payer: Medicare Other | Admitting: Family Medicine

## 2018-03-14 VITALS — BP 117/70 | HR 102 | Temp 97.0°F | Ht 67.0 in | Wt 164.0 lb

## 2018-03-14 DIAGNOSIS — M159 Polyosteoarthritis, unspecified: Secondary | ICD-10-CM | POA: Diagnosis not present

## 2018-03-14 DIAGNOSIS — G8929 Other chronic pain: Secondary | ICD-10-CM

## 2018-03-14 DIAGNOSIS — Z7409 Other reduced mobility: Secondary | ICD-10-CM | POA: Diagnosis not present

## 2018-03-14 DIAGNOSIS — T148XXA Other injury of unspecified body region, initial encounter: Secondary | ICD-10-CM

## 2018-03-14 DIAGNOSIS — R634 Abnormal weight loss: Secondary | ICD-10-CM | POA: Insufficient documentation

## 2018-03-14 DIAGNOSIS — R296 Repeated falls: Secondary | ICD-10-CM | POA: Diagnosis not present

## 2018-03-14 DIAGNOSIS — M545 Low back pain, unspecified: Secondary | ICD-10-CM

## 2018-03-14 DIAGNOSIS — I10 Essential (primary) hypertension: Secondary | ICD-10-CM

## 2018-03-14 DIAGNOSIS — Z789 Other specified health status: Secondary | ICD-10-CM

## 2018-03-14 MED ORDER — RISEDRONATE SODIUM 150 MG PO TABS: ORAL_TABLET | ORAL | 1 refills | Status: AC

## 2018-03-14 MED ORDER — AMLODIPINE BESYLATE 5 MG PO TABS: 2.5000 mg | ORAL_TABLET | Freq: Every day | ORAL | 1 refills | Status: DC

## 2018-03-14 NOTE — Progress Notes (Signed)
Subjective: RC:VELFYBO issues PCP: Janora Norlander, DO FBP:ZWCHEN P Araque is a 85 y.o. female presenting to clinic today for:  1. Balance issues Patient continues to have balance issues.  She was going to physical therapy outpatient but notes that she felt that pain was worse with this and so she discontinued.  She was seen by her orthopedist who did an injection for her hip and for her back.  She felt that symptoms got somewhat better after them but nothing significant.  She continues to have generalized body pain that she cannot specifically localize.  She is on scheduled Tylenol at home.  Her daughter has been giving her the gabapentin but notes that she reduced it some because she seems more groggy with it.  She is currently given it to her twice a day and she has noticed a bit of an improvement.  Her daughter has also reduced her blood pressure medication to 5 mg of Norvasc every other day.  She has not gotten home health PT yet.  Though it was ordered by her orthopedist.  She actually had a fall about 2 weeks ago out of her bed where her left side was affected.  She has a palpable bruise and knot along the left flank which she would like to have evaluated today.  2. Unplanned weight loss Patient continues to lose weight.  She notes that "things just do not taste good".  She denies any rectal bleeding, abdominal pain, nausea, vomiting.  2 of her daughters are present with her today, 1 of which whom she resides with.  She notes that she prepares healthy well-balanced meals every day.  Patient has 2 prepared meals every day and has multiple snacks available to her.  She also has been drinking some Ensure and eating yogurts.  Patient denies any early satiety.  She does have a gastroenterologist but has not followed up with him in a while.  Patient does not want to undergo significant testing, stating that she is "old".  ROS: Per HPI  Allergies  Allergen Reactions  . Avelox [Moxifloxacin  Hcl In Nacl]   . Codeine Nausea And Vomiting   Past Medical History:  Diagnosis Date  . Acute diverticulitis   . Anemia   . Anxiety   . AVM (arteriovenous malformation) of colon   . Cataract   . Depression   . Diverticulosis 10/2009   Pandiverticulosis, per colonoscopy, Dr. Oneida Alar  . DJD (degenerative joint disease)   . Emphysema   . GERD (gastroesophageal reflux disease)   . Hypertension   . Internal hemorrhoids   . Rectal bleeding    Secondary to diverticulosis, hemorrhoids, and/or cecal AVMs  . Wrist fracture     Current Outpatient Medications:  .  acetaminophen (TYLENOL) 500 MG tablet, Take 1-2 tablets (500-1,000 mg total) by mouth daily., Disp: 30 tablet, Rfl: 0 .  albuterol (PROAIR HFA) 108 (90 Base) MCG/ACT inhaler, INHALE 2 PUFFS INTO THE LUNGS EVERY 6 (SIX) HOURS AS NEEDED FOR WHEEZING OR SHORTNESS OF BREATH., Disp: 8.5 Inhaler, Rfl: 4 .  amLODipine (NORVASC) 5 MG tablet, TAKE 1 TABLET BY MOUTH EVERY DAY, Disp: 90 tablet, Rfl: 1 .  DULoxetine (CYMBALTA) 20 MG capsule, Take 2 capsules (40 mg total) by mouth daily., Disp: 180 capsule, Rfl: 1 .  gabapentin (NEURONTIN) 300 MG capsule, TAKE 1 CAPSULE BY MOUTH THREE TIMES A DAY, Disp: 270 capsule, Rfl: 1 .  meclizine (ANTIVERT) 25 MG tablet, TAKE 1 TABLET BY MOUTH TWICE A DAY,  Disp: 180 tablet, Rfl: 0 .  risedronate (ACTONEL) 150 MG tablet, TAKE 1 TABLET EVERY 30 DAYS WITH WATER ON EMPTY STOMACH,DO NOT EAT,DRINK OR LIE DOWN FOR NEXT30 MINS, Disp: 3 tablet, Rfl: 1 .  naproxen (NAPROSYN) 375 MG tablet, Take 1 tablet (375 mg total) by mouth 2 (two) times daily with a meal. (Patient not taking: Reported on 03/14/2018), Disp: 20 tablet, Rfl: 0 Social History   Socioeconomic History  . Marital status: Married    Spouse name: Jenny Reichmann  . Number of children: 8  . Years of education: Not on file  . Highest education level: 8th grade  Occupational History  . Occupation: House Wife  Social Needs  . Financial resource strain: Not hard at  all  . Food insecurity:    Worry: Never true    Inability: Never true  . Transportation needs:    Medical: No    Non-medical: No  Tobacco Use  . Smoking status: Former Smoker    Packs/day: 1.00    Years: 65.00    Pack years: 65.00    Last attempt to quit: 10/26/2006    Years since quitting: 11.3  . Smokeless tobacco: Never Used  Substance and Sexual Activity  . Alcohol use: No  . Drug use: No  . Sexual activity: Never  Lifestyle  . Physical activity:    Days per week: 3 days    Minutes per session: 60 min  . Stress: Not on file  Relationships  . Social connections:    Talks on phone: More than three times a week    Gets together: More than three times a week    Attends religious service: Never    Active member of club or organization: No    Attends meetings of clubs or organizations: Never    Relationship status: Married  . Intimate partner violence:    Fear of current or ex partner: No    Emotionally abused: No    Physically abused: No    Forced sexual activity: No  Other Topics Concern  . Not on file  Social History Narrative  . Not on file   Family History  Problem Relation Age of Onset  . Asthma Mother   . Cancer Sister        stomach  . Heart attack Brother   . Epilepsy Brother   . Colon cancer Neg Hx     Objective: Office vital signs reviewed. BP 117/70   Pulse (!) 102   Temp (!) 97 F (36.1 C) (Oral)   Ht 5\' 7"  (1.702 m)   Wt 164 lb (74.4 kg)   BMI 25.69 kg/m   Physical Examination:  General: Awake, alert, well nourished, No acute distress HEENT: Normal, sclera white, MMM Extremities: warm, well perfused, No edema, cyanosis or clubbing; +2 pulses bilaterally MSK: slow gait and unsteady Skin: healing ecchymosis on left flank w/ palpable almond sized hematoma. No skin breakdown.  No data found.  Assessment/ Plan: 85 y.o. female   1. Frequent falls Has been an issue for patient for quite some time.  May be related to orthostasis.  She  was certainly orthostatic during the visit.  She had a 20 point change between sitting and standing systolic pressures.  I agree with reduction of amlodipine and have actually prescribed this as 2.5 mg daily.  They will continue to monitor blood pressures very closely with goal of less than 150/90.  I also agree with reduction of gabapentin and ultimate titration off  the medicine if patient's pain is unchanged with the gabapentin.  2. Impaired mobility and ADLs  3. Chronic bilateral low back pain, unspecified whether sciatica present Followed by orthopedics.  Getting interval injections  4. Osteoarthritis involving multiple joints on both sides of body As above.  Continue scheduled Tylenol  5. Essential hypertension - amLODipine (NORVASC) 5 MG tablet; Take 0.5 tablets (2.5 mg total) by mouth daily.  Dispense: 45 tablet; Refill: 1  6. Hematoma No evidence of complication.  Reassurance.  7. Weight loss, non-intentional Concerning.  She has had almost a 20 pound weight loss over the last year.  I have asked that she please follow-up with her gastroenterologist for further evaluation.  We discussed that there may be a cognitive component to it.  Question dementia.  We briefly discussed consideration for formal cognitive eval but patient was reluctant to have this done.  I will defer this to the family to discuss further.  Patient seems to be decompensating.  I have advised to push protein and fluids.  Keep her physically active to maintain muscle tone.  She will follow-up in 1 month for recheck.   No orders of the defined types were placed in this encounter.  Meds ordered this encounter  Medications  . amLODipine (NORVASC) 5 MG tablet    Sig: Take 0.5 tablets (2.5 mg total) by mouth daily.    Dispense:  45 tablet    Refill:  1  . risedronate (ACTONEL) 150 MG tablet    Sig: TAKE 1 TABLET EVERY 30 DAYS WITH WATER ON EMPTY STOMACH,DO NOT EAT,DRINK OR LIE DOWN FOR NEXT30 MINS    Dispense:  3  tablet    Refill:  [place], DO [place] [phone]

## 2018-03-14 NOTE — Patient Instructions (Signed)
Ok to reduce gabapentin to twice daily or at night only if she is too drowsy during the day I have cut the amlodipine in 1/2.  Take 1/2 tablet daily. Keep an eye on blood pressures. Goal <150/90. See me in 1 month for BP check and weight check Continue to encourage eating Call GI for an appointment We discussed possible neurology referral today

## 2018-04-15 ENCOUNTER — Ambulatory Visit: Payer: Medicare Other | Admitting: Family Medicine

## 2018-04-15 ENCOUNTER — Other Ambulatory Visit: Payer: Self-pay

## 2018-04-16 ENCOUNTER — Telehealth: Payer: Self-pay | Admitting: Family Medicine

## 2018-04-16 ENCOUNTER — Ambulatory Visit
Admission: RE | Admit: 2018-04-16 | Discharge: 2018-04-16 | Disposition: A | Discharge status: Home / Self Care | Payer: Medicare Other | Source: Ambulatory Visit | Attending: Family Medicine | Admitting: Family Medicine

## 2018-04-16 ENCOUNTER — Ambulatory Visit (INDEPENDENT_AMBULATORY_CARE_PROVIDER_SITE_OTHER): Payer: Medicare Other | Admitting: Family Medicine

## 2018-04-16 VITALS — BP 121/75 | HR 100 | Temp 97.1°F | Ht 67.0 in | Wt 154.0 lb

## 2018-04-16 DIAGNOSIS — M5442 Lumbago with sciatica, left side: Secondary | ICD-10-CM

## 2018-04-16 DIAGNOSIS — R630 Anorexia: Secondary | ICD-10-CM | POA: Diagnosis not present

## 2018-04-16 DIAGNOSIS — R5383 Other fatigue: Secondary | ICD-10-CM

## 2018-04-16 DIAGNOSIS — R634 Abnormal weight loss: Secondary | ICD-10-CM

## 2018-04-16 DIAGNOSIS — R9389 Abnormal findings on diagnostic imaging of other specified body structures: Secondary | ICD-10-CM

## 2018-04-16 DIAGNOSIS — R296 Repeated falls: Secondary | ICD-10-CM

## 2018-04-16 DIAGNOSIS — J948 Other specified pleural conditions: Secondary | ICD-10-CM

## 2018-04-16 DIAGNOSIS — G8929 Other chronic pain: Secondary | ICD-10-CM | POA: Diagnosis not present

## 2018-04-16 DIAGNOSIS — R918 Other nonspecific abnormal finding of lung field: Secondary | ICD-10-CM

## 2018-04-16 MED ORDER — METHYLPREDNISOLONE ACETATE 40 MG/ML IJ SUSP
40.0000 mg | Freq: Once | INTRAMUSCULAR | Status: AC
  Administered 2018-04-16: 40 mg via INTRAMUSCULAR

## 2018-04-16 MED ORDER — IOPAMIDOL (ISOVUE-300) INJECTION 61%
100.0000 mL | Freq: Once | INTRAVENOUS | Status: AC | PRN
  Administered 2018-04-16: 14:00:00 100 mL via INTRAVENOUS

## 2018-04-16 NOTE — Progress Notes (Signed)
Subjective: YC:[id number] issues PCP: Janora Norlander, DO UDJ:SHFWYO P Lucy is a 85 y.o. female presenting to clinic today for:  1. Balance issues Patient continues to have balance issues and feels weaker than last visit.  She is still interested in home PT if possible.  Her daughter is present with her today and voices concern over weight loss on detioration of health since last visit. Patient reports most of her balance seems to be related to the left side lower back pain that radiates to the left buttock and hip.  She reports radiation to the left groin area.  She denies any dizziness or lightheadedness.  2. Unplanned weight loss Patient now with a 25 pound weight loss since last September.  She denies any early satiety, overt abdominal pain but does report some pain in the left lower abdomen/groin area.  She denies any fevers but reports some night sweats.  No hematochezia, melena, nausea, vomiting, abnormal vaginal discharge or bleeding.  She "does not have a taste for anything" and therefore does not eat.  Her daughter reports increased depressive symptoms, citing that she wants to stay in bed most of the day and that she does not seem to be interested in things lately.  Of note, there was a passing of a son in the last year.  She reports compliance with Cymbalta 40 mg daily.  ROS: Per HPI  Allergies  Allergen Reactions  . Avelox [Moxifloxacin Hcl In Nacl]   . Codeine Nausea And Vomiting   Past Medical History:  Diagnosis Date  . Acute diverticulitis   . Anemia   . Anxiety   . AVM (arteriovenous malformation) of colon   . Cataract   . Depression   . Diverticulosis 10/2009   Pandiverticulosis, per colonoscopy, Dr. Oneida Alar  . DJD (degenerative joint disease)   . Emphysema   . GERD (gastroesophageal reflux disease)   . Hypertension   . Internal hemorrhoids   . Rectal bleeding    Secondary to diverticulosis, hemorrhoids, and/or cecal AVMs  . Wrist fracture     Current  Outpatient Medications:  .  acetaminophen (TYLENOL) 500 MG tablet, Take 1-2 tablets (500-1,000 mg total) by mouth daily., Disp: 30 tablet, Rfl: 0 .  albuterol (PROAIR HFA) 108 (90 Base) MCG/ACT inhaler, INHALE 2 PUFFS INTO THE LUNGS EVERY 6 (SIX) HOURS AS NEEDED FOR WHEEZING OR SHORTNESS OF BREATH., Disp: 8.5 Inhaler, Rfl: 4 .  amLODipine (NORVASC) 5 MG tablet, Take 0.5 tablets (2.5 mg total) by mouth daily., Disp: 45 tablet, Rfl: 1 .  DULoxetine (CYMBALTA) 20 MG capsule, Take 2 capsules (40 mg total) by mouth daily., Disp: 180 capsule, Rfl: 1 .  gabapentin (NEURONTIN) 300 MG capsule, TAKE 1 CAPSULE BY MOUTH THREE TIMES A DAY, Disp: 270 capsule, Rfl: 1 .  meclizine (ANTIVERT) 25 MG tablet, TAKE 1 TABLET BY MOUTH TWICE A DAY, Disp: 180 tablet, Rfl: 0 .  naproxen (NAPROSYN) 375 MG tablet, Take 1 tablet (375 mg total) by mouth 2 (two) times daily with a meal. (Patient not taking: Reported on 03/14/2018), Disp: 20 tablet, Rfl: 0 .  risedronate (ACTONEL) 150 MG tablet, TAKE 1 TABLET EVERY 30 DAYS WITH WATER ON EMPTY STOMACH,DO NOT EAT,DRINK OR LIE DOWN FOR NEXT30 MINS, Disp: 3 tablet, Rfl: 1 Social History   Socioeconomic History  . Marital status: Married    Spouse name: Jenny Reichmann  . Number of children: 8  . Years of education: Not on file  . Highest education level: 8th grade  Occupational History  . Occupation: House Wife  Social Needs  . Financial resource strain: Not hard at all  . Food insecurity:    Worry: Never true    Inability: Never true  . Transportation needs:    Medical: No    Non-medical: No  Tobacco Use  . Smoking status: Former Smoker    Packs/day: 1.00    Years: 65.00    Pack years: 65.00    Last attempt to quit: 10/26/2006    Years since quitting: 11.4  . Smokeless tobacco: Never Used  Substance and Sexual Activity  . Alcohol use: No  . Drug use: No  . Sexual activity: Never  Lifestyle  . Physical activity:    Days per week: 3 days    Minutes per session: 60 min  .  Stress: Not on file  Relationships  . Social connections:    Talks on phone: More than three times a week    Gets together: More than three times a week    Attends religious service: Never    Active member of club or organization: No    Attends meetings of clubs or organizations: Never    Relationship status: Married  . Intimate partner violence:    Fear of current or ex partner: No    Emotionally abused: No    Physically abused: No    Forced sexual activity: No  Other Topics Concern  . Not on file  Social History Narrative  . Not on file   Family History  Problem Relation Age of Onset  . Asthma Mother   . Cancer Sister        stomach  . Heart attack Brother   . Epilepsy Brother   . Colon cancer Neg Hx     Objective: Office vital signs reviewed. BP 121/75   Pulse 100   Temp (!) 97.1 F (36.2 C) (Oral)   Ht '5\' 7"'  (1.702 m)   Wt 154 lb (69.9 kg)   BMI 24.12 kg/m   Physical Examination:  General: Awake, alert, appears frail, No acute distress HEENT: Normal, sclera white, MMM Cardio: RRR, no murmurs Pulm: normal work of breathing on room air. CTAB, no wheezes, rhonchi or rales. GI: soft, NTND, no palpable masses MSK: slow gait and unsteady; poor core strength. Unable to get up without push off from a seated position.   No data found.  Assessment/ Plan: 85 y.o. female   1. Weight loss, unintentional Now with a 25 pound weight loss.  Again, this most certainly may be related to underlying depressive disorder versus failure to thrive.  However, cannot rule out malignancy at this point given rapid weight loss, particularly over the last month.  I have ordered labs to further evaluate as well as ordered CT chest and abdomen pelvis.  Referral has been placed to GI.  If work-up is negative, we can pursue a failure to thrive treatment plan but I think at this point is essential to evaluate and rule out malignancy.  This was discussed with her daughter, who was present for  today's visit.  Imaging studies were arranged and patient is aware of the date and time - CBC - Lactate Dehydrogenase - CMP14+EGFR - TSH - Ambulatory referral to Gastroenterology - CT Chest W Contrast; Future - CT ABDOMEN PELVIS W CONTRAST; Future  2. Chronic left-sided low back pain with left-sided sciatica Depo-Medrol administered during today's visit.  I question if balance is only related to hip/low back pathology.  May  need to consider MRI of the brain at some point. - methylPREDNISolone acetate (DEPO-MEDROL) injection 40 mg  3. Poor appetite Again uncertain etiology.  Imaging obtained.  May need to consider reinitiation of mirtazapine for both depression, anxiety and appetite - CT ABDOMEN PELVIS W CONTRAST; Future  4. Fatigue, unspecified type - CT Chest W Contrast; Future - CT ABDOMEN PELVIS W CONTRAST; Future  5. Frequent falls As above    Orders Placed This Encounter  Procedures  . CT Chest W Contrast    Standing Status:   Future    Number of Occurrences:   1    Standing Expiration Date:   06/16/2019    Order Specific Question:   If indicated for the ordered procedure, I authorize the administration of contrast media per Radiology protocol    Answer:   Yes    Order Specific Question:   Preferred imaging location?    Answer:   [facility]    Order Specific Question:   Call Results- Best Contact Number?    Answer:   [phone]    Order Specific Question:   Radiology Contrast Protocol - do NOT remove file path    Answer:   \\[email or web address].pdf  . CT ABDOMEN PELVIS W CONTRAST    This exam should ONLY be ordered for initial diagnosis or follow up of known pancreatic/liver/renal/bladder masses.    Standing Status:   Future    Number of Occurrences:   1    Standing Expiration Date:   07/16/2019    Order Specific Question:   If indicated for the ordered procedure, I authorize the administration of contrast media per Radiology protocol     Answer:   Yes    Order Specific Question:   Preferred imaging location?    Answer:   [facility]    Order Specific Question:   Is Oral Contrast requested for this exam?    Answer:   Yes, Per Radiology protocol    Order Specific Question:   Call Results- Best Contact Number?    Answer:   [phone]    Order Specific Question:   Radiology Contrast Protocol - do NOT remove file path    Answer:   \\[email or web address].pdf  . CBC  . Lactate Dehydrogenase  . CMP14+EGFR  . TSH  . Ambulatory referral to Gastroenterology    Referral Priority:   Routine    Referral Type:   Consultation    Referral Reason:   Specialty Services Required    Number of Visits Requested:   1   Meds ordered this encounter  Medications  . methylPREDNISolone acetate (DEPO-MEDROL) injection 40 mg     Janora Norlander, DO [place] [phone]

## 2018-04-16 NOTE — Patient Instructions (Signed)
We are arranging imaging of her abdomen and getting labs to further evaluate her rapid weight loss.  I have placed a referral to the GI specialist to evaluate as well.  Some differential diagnosis we reviewed include failure to thrive, depression, impaired cognitive function.  I want to rule out malignancy and metabolic causes with imaging and labs.  I will contact you with results once they are available.  Otherwise, we will plan to reconvene in 1 month.

## 2018-04-16 NOTE — Telephone Encounter (Signed)
I called the patient to review her recent CAT scan results. Her daughters Nelle Don and Quita Skye were present for the conversation.  Patient gave me verbal permission to proceed with results with them present.  Her CAT scan revealed left-sided subpleural lesions concerning for mesothelioma versus metastatic disease.  I informed them that without biopsy on able to determine what the treatment plan would be but I did advise them if she is interested at all in treatment for lesions that we should proceed with biopsy.  She was agreeable to this and would like to further discuss treatment plans with both the specialists.  I have advised them to have a goals of care discussion so that we can have an appropriate plan and honor patient's wishes going forward.  I have encouraged him to contact me with any questions or concerns in the interim.  If they hear nothing from either the specialist by Friday, I have instructed them to contact me back at the office.  I will reach out to them again tomorrow with her lab results, which were obtained today.  Ct Chest W Contrast  Result Date: 04/16/2018 CLINICAL DATA:  Unintended weight loss, abdominal and left groin pain, decreased appetite, fatigue EXAM: CT CHEST, ABDOMEN, AND PELVIS WITH CONTRAST TECHNIQUE: Multidetector CT imaging of the chest, abdomen and pelvis was performed following the standard protocol during bolus administration of intravenous contrast. CONTRAST:  158mL ISOVUE-300 IOPAMIDOL (ISOVUE-300) INJECTION 61% Creatinine was obtained on site at [facility] at [address] Results: Creatinine 0.8 mg/dL. GFR 77 COMPARISON:  11/15/2014 FINDINGS: CT CHEST FINDINGS Cardiovascular: Coronary and aortic calcified plaque. Heart size normal. No pericardial effusion. Mediastinum/Nodes: No hilar or mediastinal adenopathy. Lungs/Pleura: Multiple left pleural masses. Largest 6.7 cm posteriorly, with central low attenuation suggesting necrosis, and chest wall invasion  involving the posterior aspect of the left sixth and tenth ribs. 2.7 cm nodule along the left major fissure at the level of left pulmonary artery. 3.7 cm nodule at the lateral aspect of the left major fissure at the level of the aortic arch. Pulmonary emphysema. 0.7 cm subpleural ground-glass attenuation lesion in the superior segment right lower lobe. 0.6 cm subpleural ground-glass attenuation lesion laterally in the right upper lobe image 39/7. 1.5 cm sub solid lesion in the anterior left upper lobe image 46/7. No pleural effusion. No pneumothorax. Musculoskeletal: Direct invasion of the posterior aspect left sixth and tenth ribs by the pleural lesion as above. No acute fracture. CT ABDOMEN PELVIS FINDINGS Hepatobiliary: No focal liver abnormality is seen. Status post cholecystectomy. No biliary dilatation. Pancreas: Mild diffuse parenchymal atrophy without ductal dilatation. Scattered coarse calcifications in the uncinate process suggesting chronic pancreatitis. Spleen: Normal in size without focal abnormality. Adrenals/Urinary Tract: 2.6 cm right adrenal mass, present since 11/15/2014. Left adrenal unremarkable. Subcentimeter probable cysts in the right kidney lower pole. Left kidney unremarkable. No hydronephrosis. Urinary bladder incompletely distended. Stomach/Bowel: Stomach and small bowel are nondistended. Appendix surgically absent. Colon is nondilated with multiple diverticula most numerous in the descending segment, without significant adjacent inflammatory/edematous change or abscess. Vascular/Lymphatic: Mild aortoiliac calcified atheromatous plaque without aneurysm. Refluxing bilateral ovarian veins. Portal vein patent. No abdominal or pelvic adenopathy. Reproductive: Uterus and bilateral adnexa are unremarkable. Other: No ascites. No free air. Musculoskeletal: Multilevel facet DJD in the lower lumbar spine. No fracture or worrisome bone lesion. IMPRESSION: 1. Multiple large left pleural masses with  chest wall invasion, may represent primary lesion such as mesothelioma versus metastatic disease. The dominant moiety would be  approachable for percutaneous biopsy if needed. 2. Bilateral sub solid pulmonary nodules as above, possibly infectious/inflammatory but nonspecific. Attention on follow-up imaging recommended to exclude neoplasm. 3. 2.6 cm right adrenal mass, stable since 11/15/2014, presumably benign adenoma. 4. Extensive colonic diverticulosis. 5. Coronary and Aortic Atherosclerosis (ICD10-I70.0). Electronically Signed   By: Lucrezia Europe M.D.   On: 04/16/2018 15:17   Ct Abdomen Pelvis W Contrast  Result Date: 04/16/2018 CLINICAL DATA:  Unintended weight loss, abdominal and left groin pain, decreased appetite, fatigue EXAM: CT CHEST, ABDOMEN, AND PELVIS WITH CONTRAST TECHNIQUE: Multidetector CT imaging of the chest, abdomen and pelvis was performed following the standard protocol during bolus administration of intravenous contrast. CONTRAST:  16mL ISOVUE-300 IOPAMIDOL (ISOVUE-300) INJECTION 61% Creatinine was obtained on site at [facility] at [address] Results: Creatinine 0.8 mg/dL. GFR 77 COMPARISON:  11/15/2014 FINDINGS: CT CHEST FINDINGS Cardiovascular: Coronary and aortic calcified plaque. Heart size normal. No pericardial effusion. Mediastinum/Nodes: No hilar or mediastinal adenopathy. Lungs/Pleura: Multiple left pleural masses. Largest 6.7 cm posteriorly, with central low attenuation suggesting necrosis, and chest wall invasion involving the posterior aspect of the left sixth and tenth ribs. 2.7 cm nodule along the left major fissure at the level of left pulmonary artery. 3.7 cm nodule at the lateral aspect of the left major fissure at the level of the aortic arch. Pulmonary emphysema. 0.7 cm subpleural ground-glass attenuation lesion in the superior segment right lower lobe. 0.6 cm subpleural ground-glass attenuation lesion laterally in the right upper lobe image 39/7. 1.5 cm sub  solid lesion in the anterior left upper lobe image 46/7. No pleural effusion. No pneumothorax. Musculoskeletal: Direct invasion of the posterior aspect left sixth and tenth ribs by the pleural lesion as above. No acute fracture. CT ABDOMEN PELVIS FINDINGS Hepatobiliary: No focal liver abnormality is seen. Status post cholecystectomy. No biliary dilatation. Pancreas: Mild diffuse parenchymal atrophy without ductal dilatation. Scattered coarse calcifications in the uncinate process suggesting chronic pancreatitis. Spleen: Normal in size without focal abnormality. Adrenals/Urinary Tract: 2.6 cm right adrenal mass, present since 11/15/2014. Left adrenal unremarkable. Subcentimeter probable cysts in the right kidney lower pole. Left kidney unremarkable. No hydronephrosis. Urinary bladder incompletely distended. Stomach/Bowel: Stomach and small bowel are nondistended. Appendix surgically absent. Colon is nondilated with multiple diverticula most numerous in the descending segment, without significant adjacent inflammatory/edematous change or abscess. Vascular/Lymphatic: Mild aortoiliac calcified atheromatous plaque without aneurysm. Refluxing bilateral ovarian veins. Portal vein patent. No abdominal or pelvic adenopathy. Reproductive: Uterus and bilateral adnexa are unremarkable. Other: No ascites. No free air. Musculoskeletal: Multilevel facet DJD in the lower lumbar spine. No fracture or worrisome bone lesion. IMPRESSION: 1. Multiple large left pleural masses with chest wall invasion, may represent primary lesion such as mesothelioma versus metastatic disease. The dominant moiety would be approachable for percutaneous biopsy if needed. 2. Bilateral sub solid pulmonary nodules as above, possibly infectious/inflammatory but nonspecific. Attention on follow-up imaging recommended to exclude neoplasm. 3. 2.6 cm right adrenal mass, stable since 11/15/2014, presumably benign adenoma. 4. Extensive colonic diverticulosis. 5.  Coronary and Aortic Atherosclerosis (ICD10-I70.0). Electronically Signed   By: Lucrezia Europe M.D.   On: 04/16/2018 15:17   Pleural mass - Plan: Ambulatory referral to Hematology / Oncology, Ambulatory referral to Pulmonology  Pulmonary nodules - Plan: Ambulatory referral to Hematology / Oncology, Ambulatory referral to Pulmonology  Abnormal CT of the chest - Plan: Ambulatory referral to Hematology / Oncology, Ambulatory referral to Pulmonology  Unexplained weight loss - Plan: Ambulatory referral to Hematology /  Oncology, Ambulatory referral to Pulmonology  Terek Bee M. Lajuana Ripple, [facility]

## 2018-04-17 ENCOUNTER — Telehealth: Payer: Self-pay | Admitting: *Deleted

## 2018-04-17 LAB — CMP14+EGFR
ALT: 7 IU/L (ref 0–32)
AST: 18 IU/L (ref 0–40)
Albumin/Globulin Ratio: 1.3 (ref 1.2–2.2)
Albumin: 4.4 g/dL (ref 3.6–4.6)
Alkaline Phosphatase: 82 IU/L (ref 39–117)
BUN/Creatinine Ratio: 8 — ABNORMAL LOW (ref 12–28)
BUN: 7 mg/dL — ABNORMAL LOW (ref 8–27)
Bilirubin Total: 0.5 mg/dL (ref 0.0–1.2)
CO2: 24 mmol/L (ref 20–29)
Calcium: 12.3 mg/dL — ABNORMAL HIGH (ref 8.7–10.3)
Chloride: 99 mmol/L (ref 96–106)
Creatinine, Ser: 0.91 mg/dL (ref 0.57–1.00)
GFR calc Af Amer: 66 mL/min/{1.73_m2} (ref >59)
GFR calc non Af Amer: 57 mL/min/{1.73_m2} — ABNORMAL LOW (ref >59)
Globulin, Total: 3.3 g/dL (ref 1.5–4.5)
Glucose: 95 mg/dL (ref 65–99)
Potassium: 4.5 mmol/L (ref 3.5–5.2)
Sodium: 139 mmol/L (ref 134–144)
Total Protein: 7.7 g/dL (ref 6.0–8.5)

## 2018-04-17 LAB — CBC
Hematocrit: 43.5 % (ref 34.0–46.6)
Hemoglobin: 14 g/dL (ref 11.1–15.9)
MCH: 28.2 pg (ref 26.6–33.0)
MCHC: 32.2 g/dL (ref 31.5–35.7)
MCV: 88 fL (ref 79–97)
Platelets: 579 10*3/uL — ABNORMAL HIGH (ref 150–450)
RBC: 4.97 x10E6/uL (ref 3.77–5.28)
RDW: 13.2 % (ref 11.7–15.4)
WBC: 8.6 10*3/uL (ref 3.4–10.8)

## 2018-04-17 LAB — LACTATE DEHYDROGENASE: LDH: 285 IU/L — ABNORMAL HIGH (ref 119–226)

## 2018-04-17 LAB — TSH: TSH: 1.17 u[IU]/mL (ref 0.450–4.500)

## 2018-04-17 NOTE — Telephone Encounter (Signed)
Notified daughter of upcoming appt Friday morning and reviewed screening process and strict no visitor policy. Daughter has my number to call for concerns.

## 2018-04-18 ENCOUNTER — Other Ambulatory Visit: Payer: Self-pay | Admitting: Family Medicine

## 2018-04-18 DIAGNOSIS — I10 Essential (primary) hypertension: Secondary | ICD-10-CM

## 2018-04-19 ENCOUNTER — Encounter: Payer: Self-pay | Admitting: Oncology

## 2018-04-19 ENCOUNTER — Other Ambulatory Visit: Payer: Self-pay

## 2018-04-19 ENCOUNTER — Inpatient Hospital Stay: Payer: Medicare Other | Attending: Oncology | Admitting: Oncology

## 2018-04-19 VITALS — BP 121/74 | HR 89 | Temp 97.8°F | Ht 67.0 in | Wt 156.0 lb

## 2018-04-19 DIAGNOSIS — J45909 Unspecified asthma, uncomplicated: Secondary | ICD-10-CM | POA: Diagnosis not present

## 2018-04-19 DIAGNOSIS — Z79899 Other long term (current) drug therapy: Secondary | ICD-10-CM | POA: Diagnosis not present

## 2018-04-19 DIAGNOSIS — Z87891 Personal history of nicotine dependence: Secondary | ICD-10-CM | POA: Insufficient documentation

## 2018-04-19 DIAGNOSIS — M1990 Unspecified osteoarthritis, unspecified site: Secondary | ICD-10-CM | POA: Insufficient documentation

## 2018-04-19 DIAGNOSIS — I10 Essential (primary) hypertension: Secondary | ICD-10-CM | POA: Diagnosis not present

## 2018-04-19 DIAGNOSIS — C3492 Malignant neoplasm of unspecified part of left bronchus or lung: Secondary | ICD-10-CM | POA: Diagnosis not present

## 2018-04-19 DIAGNOSIS — R531 Weakness: Secondary | ICD-10-CM | POA: Diagnosis not present

## 2018-04-19 DIAGNOSIS — R42 Dizziness and giddiness: Secondary | ICD-10-CM

## 2018-04-19 DIAGNOSIS — R630 Anorexia: Secondary | ICD-10-CM | POA: Diagnosis not present

## 2018-04-19 DIAGNOSIS — R634 Abnormal weight loss: Secondary | ICD-10-CM | POA: Insufficient documentation

## 2018-04-19 DIAGNOSIS — G8929 Other chronic pain: Secondary | ICD-10-CM | POA: Diagnosis not present

## 2018-04-19 DIAGNOSIS — R5381 Other malaise: Secondary | ICD-10-CM | POA: Diagnosis not present

## 2018-04-19 DIAGNOSIS — J439 Emphysema, unspecified: Secondary | ICD-10-CM | POA: Insufficient documentation

## 2018-04-19 DIAGNOSIS — R5383 Other fatigue: Secondary | ICD-10-CM | POA: Diagnosis not present

## 2018-04-19 DIAGNOSIS — Z7951 Long term (current) use of inhaled steroids: Secondary | ICD-10-CM | POA: Insufficient documentation

## 2018-04-19 DIAGNOSIS — J948 Other specified pleural conditions: Secondary | ICD-10-CM | POA: Insufficient documentation

## 2018-04-19 DIAGNOSIS — E279 Disorder of adrenal gland, unspecified: Secondary | ICD-10-CM | POA: Insufficient documentation

## 2018-04-19 NOTE — Patient Instructions (Signed)
We will schedule you a CT Guided Biopsy of your Left Pleural Mass. We will call you with appointment information. Please be available for the WebEx appointment once week later to go over biopsy results and treatment plan. Thank you.

## 2018-04-19 NOTE — Progress Notes (Signed)
Patient is here to establish care for pleural mass. Patient is not sure why she is here today. Patient stated that she has pain all over her body due to arthritis.

## 2018-04-19 NOTE — Progress Notes (Signed)
[place]  [phone] Fax:(336) [phone]  ID: [name] OB: Oct 29, 1928  MR#: [id number]  UVO#:[id number]  Patient Care Team: Janora Norlander, DO as PCP - General (Family Medicine) Gala Romney Cristopher Estimable, MD as Consulting Physician (Gastroenterology) Roseanne Kaufman, MD as Consulting Physician (Orthopedic Surgery) Gaynelle Arabian, MD as Consulting Physician (Orthopedic Surgery)  CHIEF COMPLAINT: Weight loss, pleural mass.  INTERVAL HISTORY: Patient is an 85 year old female who underwent imaging for evaluation of unintentional weight loss.  Her daughters report increased weakness and fatigue over the past several weeks.  They also states she has reported more dizziness.  She complains of significant arthritic pain that is chronic and unchanged.  She has no other neurologic complaints.  She denies any recent fevers or illnesses.  She denies any chest pain, shortness of breath, cough, or hemoptysis.  She has no nausea, vomiting, constipation, or diarrhea.  She has no melena or hematochezia.  She has no urinary complaints.  Patient otherwise feels well and offers no further specific complaints.  REVIEW OF SYSTEMS:   Review of Systems  Constitutional: Positive for malaise/fatigue and weight loss. Negative for fever.  Respiratory: Negative.  Negative for cough, hemoptysis and shortness of breath.   Cardiovascular: Negative.  Negative for chest pain and leg swelling.  Gastrointestinal: Negative.  Negative for abdominal pain.  Genitourinary: Negative.  Negative for dysuria.  Musculoskeletal: Positive for back pain and joint pain. Negative for myalgias.  Skin: Negative.  Negative for rash.  Neurological: Positive for dizziness. Negative for focal weakness, weakness and headaches.  Psychiatric/Behavioral: Negative.  The patient is not nervous/anxious.     As per HPI. Otherwise, a complete review of systems is negative.  PAST MEDICAL HISTORY: Past Medical  History:  Diagnosis Date   Acute diverticulitis    Anemia    Anxiety    AVM (arteriovenous malformation) of colon    Cataract    Depression    Diverticulosis 10/2009   Pandiverticulosis, per colonoscopy, Dr. Oneida Alar   DJD (degenerative joint disease)    Emphysema    GERD (gastroesophageal reflux disease)    Hypertension    Internal hemorrhoids    Rectal bleeding    Secondary to diverticulosis, hemorrhoids, and/or cecal AVMs   Wrist fracture     PAST SURGICAL HISTORY: Past Surgical History:  Procedure Laterality Date   CATARACT EXTRACTION Bilateral 2017   COLONOSCOPY  Oct 2011   Dr. Oneida Alar: pancolonic diverticulosis, 2 cecal AVMs s/p ablation, retained stool in left colon, moderate internal hemorrhoids   ESOPHAGOGASTRODUODENOSCOPY Left 08/10/2012   Procedure: ESOPHAGOGASTRODUODENOSCOPY (EGD);  Surgeon: Daneil Dolin, MD;  Location: [facility];  Service: Endoscopy;  Laterality: Left;  EGD for abdominal pain and GIB   Right total knee arthroplasty     X2    FAMILY HISTORY: Family History  Problem Relation Age of Onset   Asthma Mother    Cancer Sister        stomach   Heart attack Brother    Epilepsy Brother    Colon cancer Neg Hx     ADVANCED DIRECTIVES (Y/N):  N  HEALTH MAINTENANCE: Social History   Tobacco Use   Smoking status: Former Smoker    Packs/day: 1.00    Years: 65.00    Pack years: 65.00    Last attempt to quit: 10/26/2006    Years since quitting: 11.4   Smokeless tobacco: Never Used  Substance Use Topics   Alcohol use: No   Drug use: No  Colonoscopy:  PAP:  Bone density:  Lipid panel:  Allergies  Allergen Reactions   Avelox [Moxifloxacin Hcl In Nacl]    Codeine Nausea And Vomiting    Current Outpatient Medications  Medication Sig Dispense Refill   acetaminophen (TYLENOL) 500 MG tablet Take 1-2 tablets (500-1,000 mg total) by mouth daily. 30 tablet 0   albuterol (PROAIR HFA) 108 (90 Base) MCG/ACT  inhaler INHALE 2 PUFFS INTO THE LUNGS EVERY 6 (SIX) HOURS AS NEEDED FOR WHEEZING OR SHORTNESS OF BREATH. 8.5 Inhaler 4   amLODipine (NORVASC) 5 MG tablet Take 0.5 tablets (2.5 mg total) by mouth daily. 45 tablet 1   DULoxetine (CYMBALTA) 20 MG capsule Take 2 capsules (40 mg total) by mouth daily. 180 capsule 1   gabapentin (NEURONTIN) 300 MG capsule TAKE 1 CAPSULE BY MOUTH THREE TIMES A DAY 270 capsule 1   meclizine (ANTIVERT) 25 MG tablet TAKE 1 TABLET BY MOUTH TWICE A DAY 180 tablet 0   naproxen (NAPROSYN) 375 MG tablet Take 1 tablet (375 mg total) by mouth 2 (two) times daily with a meal. 20 tablet 0   risedronate (ACTONEL) 150 MG tablet TAKE 1 TABLET EVERY 30 DAYS WITH WATER ON EMPTY STOMACH,DO NOT EAT,DRINK OR LIE DOWN FOR NEXT30 MINS 3 tablet 1   No current facility-administered medications for this visit.     OBJECTIVE: Vitals:   04/19/18 0943  BP: 121/74  Pulse: 89  Temp: 97.8 F (36.6 C)     Body mass index is 24.43 kg/m.    ECOG FS:2 - Symptomatic, <50% confined to bed  General: Well-developed, well-nourished, no acute distress.  Sitting in a wheelchair. Eyes: Pink conjunctiva, anicteric sclera. HEENT: Normocephalic, moist mucous membranes, clear oropharnyx. Lungs: Clear to auscultation bilaterally. Heart: Regular rate and rhythm. No rubs, murmurs, or gallops. Abdomen: Soft, nontender, nondistended. No organomegaly noted, normoactive bowel sounds. Musculoskeletal: No edema, cyanosis, or clubbing. Neuro: Alert, answering all questions appropriately. Cranial nerves grossly intact. Skin: No rashes or petechiae noted. Psych: Normal affect. Lymphatics: No cervical, calvicular, axillary or inguinal LAD.   LAB RESULTS:  Lab Results  Component Value Date   NA 139 04/16/2018   K 4.5 04/16/2018   CL 99 04/16/2018   CO2 24 04/16/2018   GLUCOSE 95 04/16/2018   BUN 7 (L) 04/16/2018   CREATININE 0.91 04/16/2018   CALCIUM 12.3 (H) 04/16/2018   PROT 7.7 04/16/2018    ALBUMIN 4.4 04/16/2018   AST 18 04/16/2018   ALT 7 04/16/2018   ALKPHOS 82 04/16/2018   BILITOT 0.5 04/16/2018   GFRNONAA 57 (L) 04/16/2018   GFRAA 66 04/16/2018    Lab Results  Component Value Date   WBC 8.6 04/16/2018   NEUTROABS 6.0 09/28/2017   HGB 14.0 04/16/2018   HCT 43.5 04/16/2018   MCV 88 04/16/2018   PLT 579 (H) 04/16/2018     STUDIES: Ct Chest W Contrast  Result Date: 04/16/2018 CLINICAL DATA:  Unintended weight loss, abdominal and left groin pain, decreased appetite, fatigue EXAM: CT CHEST, ABDOMEN, AND PELVIS WITH CONTRAST TECHNIQUE: Multidetector CT imaging of the chest, abdomen and pelvis was performed following the standard protocol during bolus administration of intravenous contrast. CONTRAST:  17mL ISOVUE-300 IOPAMIDOL (ISOVUE-300) INJECTION 61% Creatinine was obtained on site at [facility] at [address] Results: Creatinine 0.8 mg/dL. GFR 77 COMPARISON:  11/15/2014 FINDINGS: CT CHEST FINDINGS Cardiovascular: Coronary and aortic calcified plaque. Heart size normal. No pericardial effusion. Mediastinum/Nodes: No hilar or mediastinal adenopathy. Lungs/Pleura: Multiple left  pleural masses. Largest 6.7 cm posteriorly, with central low attenuation suggesting necrosis, and chest wall invasion involving the posterior aspect of the left sixth and tenth ribs. 2.7 cm nodule along the left major fissure at the level of left pulmonary artery. 3.7 cm nodule at the lateral aspect of the left major fissure at the level of the aortic arch. Pulmonary emphysema. 0.7 cm subpleural ground-glass attenuation lesion in the superior segment right lower lobe. 0.6 cm subpleural ground-glass attenuation lesion laterally in the right upper lobe image 39/7. 1.5 cm sub solid lesion in the anterior left upper lobe image 46/7. No pleural effusion. No pneumothorax. Musculoskeletal: Direct invasion of the posterior aspect left sixth and tenth ribs by the pleural lesion as above. No acute  fracture. CT ABDOMEN PELVIS FINDINGS Hepatobiliary: No focal liver abnormality is seen. Status post cholecystectomy. No biliary dilatation. Pancreas: Mild diffuse parenchymal atrophy without ductal dilatation. Scattered coarse calcifications in the uncinate process suggesting chronic pancreatitis. Spleen: Normal in size without focal abnormality. Adrenals/Urinary Tract: 2.6 cm right adrenal mass, present since 11/15/2014. Left adrenal unremarkable. Subcentimeter probable cysts in the right kidney lower pole. Left kidney unremarkable. No hydronephrosis. Urinary bladder incompletely distended. Stomach/Bowel: Stomach and small bowel are nondistended. Appendix surgically absent. Colon is nondilated with multiple diverticula most numerous in the descending segment, without significant adjacent inflammatory/edematous change or abscess. Vascular/Lymphatic: Mild aortoiliac calcified atheromatous plaque without aneurysm. Refluxing bilateral ovarian veins. Portal vein patent. No abdominal or pelvic adenopathy. Reproductive: Uterus and bilateral adnexa are unremarkable. Other: No ascites. No free air. Musculoskeletal: Multilevel facet DJD in the lower lumbar spine. No fracture or worrisome bone lesion. IMPRESSION: 1. Multiple large left pleural masses with chest wall invasion, may represent primary lesion such as mesothelioma versus metastatic disease. The dominant moiety would be approachable for percutaneous biopsy if needed. 2. Bilateral sub solid pulmonary nodules as above, possibly infectious/inflammatory but nonspecific. Attention on follow-up imaging recommended to exclude neoplasm. 3. 2.6 cm right adrenal mass, stable since 11/15/2014, presumably benign adenoma. 4. Extensive colonic diverticulosis. 5. Coronary and Aortic Atherosclerosis (ICD10-I70.0). Electronically Signed   By: Lucrezia Europe M.D.   On: 04/16/2018 15:17   Ct Abdomen Pelvis W Contrast  Result Date: 04/16/2018 CLINICAL DATA:  Unintended weight loss,  abdominal and left groin pain, decreased appetite, fatigue EXAM: CT CHEST, ABDOMEN, AND PELVIS WITH CONTRAST TECHNIQUE: Multidetector CT imaging of the chest, abdomen and pelvis was performed following the standard protocol during bolus administration of intravenous contrast. CONTRAST:  127mL ISOVUE-300 IOPAMIDOL (ISOVUE-300) INJECTION 61% Creatinine was obtained on site at [facility] at [address] Results: Creatinine 0.8 mg/dL. GFR 77 COMPARISON:  11/15/2014 FINDINGS: CT CHEST FINDINGS Cardiovascular: Coronary and aortic calcified plaque. Heart size normal. No pericardial effusion. Mediastinum/Nodes: No hilar or mediastinal adenopathy. Lungs/Pleura: Multiple left pleural masses. Largest 6.7 cm posteriorly, with central low attenuation suggesting necrosis, and chest wall invasion involving the posterior aspect of the left sixth and tenth ribs. 2.7 cm nodule along the left major fissure at the level of left pulmonary artery. 3.7 cm nodule at the lateral aspect of the left major fissure at the level of the aortic arch. Pulmonary emphysema. 0.7 cm subpleural ground-glass attenuation lesion in the superior segment right lower lobe. 0.6 cm subpleural ground-glass attenuation lesion laterally in the right upper lobe image 39/7. 1.5 cm sub solid lesion in the anterior left upper lobe image 46/7. No pleural effusion. No pneumothorax. Musculoskeletal: Direct invasion of the posterior aspect left sixth and tenth ribs by  the pleural lesion as above. No acute fracture. CT ABDOMEN PELVIS FINDINGS Hepatobiliary: No focal liver abnormality is seen. Status post cholecystectomy. No biliary dilatation. Pancreas: Mild diffuse parenchymal atrophy without ductal dilatation. Scattered coarse calcifications in the uncinate process suggesting chronic pancreatitis. Spleen: Normal in size without focal abnormality. Adrenals/Urinary Tract: 2.6 cm right adrenal mass, present since 11/15/2014. Left adrenal unremarkable.  Subcentimeter probable cysts in the right kidney lower pole. Left kidney unremarkable. No hydronephrosis. Urinary bladder incompletely distended. Stomach/Bowel: Stomach and small bowel are nondistended. Appendix surgically absent. Colon is nondilated with multiple diverticula most numerous in the descending segment, without significant adjacent inflammatory/edematous change or abscess. Vascular/Lymphatic: Mild aortoiliac calcified atheromatous plaque without aneurysm. Refluxing bilateral ovarian veins. Portal vein patent. No abdominal or pelvic adenopathy. Reproductive: Uterus and bilateral adnexa are unremarkable. Other: No ascites. No free air. Musculoskeletal: Multilevel facet DJD in the lower lumbar spine. No fracture or worrisome bone lesion. IMPRESSION: 1. Multiple large left pleural masses with chest wall invasion, may represent primary lesion such as mesothelioma versus metastatic disease. The dominant moiety would be approachable for percutaneous biopsy if needed. 2. Bilateral sub solid pulmonary nodules as above, possibly infectious/inflammatory but nonspecific. Attention on follow-up imaging recommended to exclude neoplasm. 3. 2.6 cm right adrenal mass, stable since 11/15/2014, presumably benign adenoma. 4. Extensive colonic diverticulosis. 5. Coronary and Aortic Atherosclerosis (ICD10-I70.0). Electronically Signed   By: Lucrezia Europe M.D.   On: 04/16/2018 15:17    ASSESSMENT: Weight loss, pleural mass.  PLAN:    1. Pleural mass: CT scan results from April 16, 2018 reviewed independently and report as above with multiple left lung pleural masses and chest wall invasion highly suspicious for underlying malignancy.  Despite patient's age and comorbidities, she wishes to pursue CT-guided biopsy to obtain a diagnosis.  Plan was also discussed with patient's 2 daughters, Donetta Potts [phone]) and Gerhard Perches [phone]) and their agreement with pursuing biopsy.  Plan is to have a WebEx visit 1  week after her biopsy to discuss the results and treatment planning if necessary. 2.  Weight loss: Likely secondary to underlying malignancy, consider dietary consultation in the future. 3.  Dizziness: Patient will require MRI of the brain in the near future to complete the staging work-up. 4.  Pain: Chronic secondary to patient's underlying arthritis.  Monitor.  I spent a total of 60 minutes face-to-face with the patient of which greater than 50% of the visit was spent in counseling and coordination of care as detailed above.  Patient expressed understanding and was in agreement with this plan. She also understands that She can call clinic at any time with any questions, concerns, or complaints.   Cancer Staging No matching staging information was found for the patient.  Lloyd Huger, MD   04/19/2018 2:17 PM

## 2018-04-22 ENCOUNTER — Encounter: Payer: Self-pay | Admitting: *Deleted

## 2018-04-22 NOTE — Progress Notes (Signed)
  Oncology Nurse Navigator Documentation  Navigator Location: [facility]Med Onc (04/22/18 1200) Referral date to RadOnc/MedOnc: 04/16/18 (04/22/18 1200) )Navigator Encounter Type: Introductory phone call;Telephone (04/22/18 1200) Telephone: Outgoing Call;Clinic/[facility]C Follow-up (04/22/18 1200) Abnormal Finding Date: 04/16/18 (04/22/18 1200)                 Patient Visit Type: MedOnc (04/22/18 1200) Treatment Phase: Abnormal Scans (04/22/18 1200) Barriers/Navigation Needs: Coordination of Care (04/22/18 1200)   Interventions: Coordination of Care (04/22/18 1200)   Coordination of Care: Appts;Radiology (04/22/18 1200)        Acuity: Level 2 (04/22/18 1200)   Acuity Level 2: Initial guidance, education and coordination as needed;Educational needs;Assistance expediting appointments (04/22/18 1200)  follow up phone call made to patient after initial consult with Dr. Grayland Ormond on 04/19/2018. Pt did not answer and unable to leave message due to voicemail not set up. Will try again at a later time to introduce patient to navigator services and answer any remaining questions.    Time Spent with Patient: 30 (04/22/18 1200)

## 2018-04-22 NOTE — Progress Notes (Signed)
Spoke with pt's daughter and introduced to navigator services. All questions answered during phone call. Instructed to call back if has any further questions or needs.

## 2018-04-23 NOTE — Progress Notes (Signed)
Spoke with pt's daughter, [name], to inform of Virtual Visit via webex scheduled on 4/28 at 11:15am. Instructed to call back if has any questions or concerns. Dell verbalized understanding.

## 2018-04-29 ENCOUNTER — Other Ambulatory Visit: Payer: Self-pay | Admitting: Student

## 2018-04-30 ENCOUNTER — Ambulatory Visit
Admission: RE | Admit: 2018-04-30 | Discharge: 2018-04-30 | Disposition: A | Discharge status: Home / Self Care | Payer: Medicare Other | Source: Ambulatory Visit | Attending: Oncology | Admitting: Oncology

## 2018-04-30 ENCOUNTER — Other Ambulatory Visit: Payer: Self-pay

## 2018-04-30 DIAGNOSIS — C384 Malignant neoplasm of pleura: Secondary | ICD-10-CM | POA: Diagnosis not present

## 2018-04-30 DIAGNOSIS — C3482 Malignant neoplasm of overlapping sites of left bronchus and lung: Secondary | ICD-10-CM | POA: Diagnosis not present

## 2018-04-30 DIAGNOSIS — R918 Other nonspecific abnormal finding of lung field: Secondary | ICD-10-CM | POA: Diagnosis not present

## 2018-04-30 DIAGNOSIS — J948 Other specified pleural conditions: Secondary | ICD-10-CM | POA: Diagnosis not present

## 2018-04-30 DIAGNOSIS — C50212 Malignant neoplasm of upper-inner quadrant of left female breast: Secondary | ICD-10-CM | POA: Diagnosis not present

## 2018-04-30 DIAGNOSIS — C782 Secondary malignant neoplasm of pleura: Secondary | ICD-10-CM | POA: Diagnosis not present

## 2018-04-30 LAB — CBC
HCT: 43.4 % (ref 36.0–46.0)
Hemoglobin: 13.6 g/dL (ref 12.0–15.0)
MCH: 28.9 pg (ref 26.0–34.0)
MCHC: 31.3 g/dL (ref 30.0–36.0)
MCV: 92.1 fL (ref 80.0–100.0)
Platelets: 495 10*3/uL — ABNORMAL HIGH (ref 150–400)
RBC: 4.71 MIL/uL (ref 3.87–5.11)
RDW: 14.3 % (ref 11.5–15.5)
WBC: 11.2 10*3/uL — ABNORMAL HIGH (ref 4.0–10.5)
nRBC: 0 % (ref 0.0–0.2)

## 2018-04-30 LAB — PROTIME-INR
INR: 1 (ref 0.8–1.2)
Prothrombin Time: 13.2 seconds (ref 11.4–15.2)

## 2018-04-30 LAB — APTT: aPTT: 33 seconds (ref 24–36)

## 2018-04-30 MED ORDER — FENTANYL CITRATE (PF) 100 MCG/2ML IJ SOLN
INTRAMUSCULAR | Status: AC | PRN
  Administered 2018-04-30 (×2): 25 ug via INTRAVENOUS

## 2018-04-30 MED ORDER — FENTANYL CITRATE (PF) 100 MCG/2ML IJ SOLN
INTRAMUSCULAR | Status: AC
  Filled 2018-04-30: qty 2

## 2018-04-30 MED ORDER — SODIUM CHLORIDE 0.9 % IV SOLN: INTRAVENOUS | Status: DC

## 2018-04-30 MED ORDER — MIDAZOLAM HCL 2 MG/2ML IJ SOLN
INTRAMUSCULAR | Status: AC
  Filled 2018-04-30: qty 2

## 2018-04-30 NOTE — Procedures (Signed)
CT Left pleural biopsy without difficulty  Complications:  None  Blood Loss: none  See dictation in canopy pacs

## 2018-04-30 NOTE — Discharge Instructions (Signed)
Moderate Conscious Sedation, Adult, Care After These instructions provide you with information about caring for yourself after your procedure. Your health care provider may also give you more specific instructions. Your treatment has been planned according to current medical practices, but problems sometimes occur. Call your health care provider if you have any problems or questions after your procedure. What can I expect after the procedure? After your procedure, it is common:  To feel sleepy for several hours.  To feel clumsy and have poor balance for several hours.  To have poor judgment for several hours.  To vomit if you eat too soon. Follow these instructions at home: For at least 24 hours after the procedure:   Do not: ? Participate in activities where you could fall or become injured. ? Drive. ? Use heavy machinery. ? Drink alcohol. ? Take sleeping pills or medicines that cause drowsiness. ? Make important decisions or sign legal documents. ? Take care of children on your own.  Rest. Eating and drinking  Follow the diet recommended by your health care provider.  If you vomit: ? Drink water, juice, or soup when you can drink without vomiting. ? Make sure you have little or no nausea before eating solid foods. General instructions  Have a responsible adult stay with you until you are awake and alert.  Take over-the-counter and prescription medicines only as told by your health care provider.  If you smoke, do not smoke without supervision.  Keep all follow-up visits as told by your health care provider. This is important. Contact a health care provider if:  You keep feeling nauseous or you keep vomiting.  You feel light-headed.  You develop a rash.  You have a fever. Get help right away if:  You have trouble breathing. This information is not intended to replace advice given to you by your health care provider. Make sure you discuss any questions you have  with your health care provider. Document Released: 10/16/2012 Document Revised: 05/31/2015 Document Reviewed: 04/17/2015 Elsevier Interactive Patient Education  2019 [employer] Biopsy, Care After This sheet gives you information about how to care for yourself after your procedure. Your health care provider may also give you more specific instructions depending on the type of biopsy you had. If you have problems or questions, contact your health care provider. What can I expect after the procedure? After the procedure, it is common to have:  A cough.  A sore throat.  Pain where a needle, bronchoscope, or incision was used to collect a biopsy sample (biopsy site). Follow these instructions at home: Medicines   Take over-the-counter and prescription medicines only as told by your health care provider.  Do not drive for 24 hours if you were given a sedative.  Do not drink alcohol while taking pain medicine.  Do not drive or use heavy machinery while taking prescription pain medicine.  To prevent or treat constipation while you are taking prescription pain medicine, your health care provider may recommend that you: ? Drink enough fluid to keep your urine clear or pale yellow. ? Take over-the-counter or prescription medicines. ? Eat foods that are high in fiber, such as fresh fruits and vegetables, whole grains, and beans. ? Limit foods that are high in fat and processed sugars, such as fried and sweet foods. Activity  If you had an incision during your procedure, avoid activities that may pull the incision site open.  Return to your normal activities as told by your health care  provider. Ask your health care provider what activities are safe for you. If you had an open biopsy:   Follow instructions from your health care provider about how to take care of your incision. Make sure you: ? Wash your hands with soap and water before you change your bandage (dressing). If soap and  water are not available, use hand sanitizer. ? Change your dressing as told by your health care provider. ? Leave stitches (sutures), skin glue, or adhesive strips in place. These skin closures may need to stay in place for 2 weeks or longer. If adhesive strip edges start to loosen and curl up, you may trim the loose edges. Do not remove adhesive strips completely unless your health care provider tells you to do that.  Check your incision area every day for signs of infection. Check for: ? Redness, swelling, or pain. ? Fluid or blood. ? Warmth. ? Pus or a bad smell. General instructions  It is up to you to get the results of your procedure. Ask your health care provider, or the department that is doing the procedure, when your results will be ready. Contact a health care provider if:  You have a fever.  You have redness, swelling, or pain around your biopsy site.  You have fluid or blood coming from your biopsy site.  Your biopsy site feels warm to the touch.  You have pus or a bad smell coming from your biopsy site. Get help right away if:  You cough up blood.  You have trouble breathing.  You have chest pain. Summary  After the procedure, it is common to have a sore throat and a cough.  Return to your normal activities as told by your health care provider. Ask your health care provider what activities are safe for you.  Take over-the-counter and prescription medicines only as told by your health care provider.  Report any unusual symptoms to your health care provider. This information is not intended to replace advice given to you by your health care provider. Make sure you discuss any questions you have with your health care provider. Document Released: 01/25/2016 Document Revised: 01/25/2016 Document Reviewed: 01/25/2016 Elsevier Interactive Patient Education  2019 [employer].

## 2018-04-30 NOTE — Progress Notes (Signed)
Patient remains clinically stable post CT Lung Biopsy, dressing dry and intact. Taking po's without difficulty, sinus rhythm per monitor. Went over discharge instructions with patient and daught Tinita over phone with questions answered. Pt denies complaints at this time.

## 2018-05-01 ENCOUNTER — Other Ambulatory Visit: Payer: Self-pay | Admitting: Family Medicine

## 2018-05-02 LAB — SURGICAL PATHOLOGY

## 2018-05-05 ENCOUNTER — Encounter: Payer: Self-pay | Admitting: Orthopedic Surgery

## 2018-05-06 ENCOUNTER — Other Ambulatory Visit: Payer: Self-pay

## 2018-05-06 DIAGNOSIS — C3492 Malignant neoplasm of unspecified part of left bronchus or lung: Secondary | ICD-10-CM | POA: Insufficient documentation

## 2018-05-06 NOTE — Progress Notes (Signed)
[employer]  [phone] [phone]  ID: [name] OB: [birth year]  MR#: [id number]  [facility]#:[id number]  Patient Care Team: Janora Norlander, DO as PCP - General (Family Medicine) Gala Romney, Cristopher Estimable, MD as Consulting Physician (Gastroenterology) Roseanne Kaufman, MD as Consulting Physician (Orthopedic Surgery) Gaynelle Arabian, MD as Consulting Physician (Orthopedic Surgery) Telford Nab, RN as Registered Nurse  I connected with [name] on 05/07/18 at 11:15 AM EDT by video enabled telemedicine visit and verified that I am speaking with the correct person using two identifiers.   I discussed the limitations, risks, security and privacy concerns of performing an evaluation and management service by telemedicine and the availability of in-person appointments. I also discussed with the patient that there may be a patient responsible charge related to this service. The patient expressed understanding and agreed to proceed.   Other persons participating in the visit and their role in the encounter: Nursing, patient, multiple family members.  Patients location: Home Providers location: Clinic  CHIEF COMPLAINT: Stage IV squamous cell carcinoma of left lung.  INTERVAL HISTORY: Patient and her family agreed to video enabled telemedicine visit to discuss her biopsy results and additional diagnostic planning.  She continues to have increased pain as well as a poor appetite.  She has increased weakness and fatigue and worsening memory.  She continues to have weight loss. She has no other neurologic complaints.  She denies any recent fevers or illnesses.  She denies any chest pain, shortness of breath, cough, or hemoptysis.  She has no nausea, vomiting, constipation, or diarrhea.  She has no melena or hematochezia.  She has no urinary complaints.  Patient offers no further specific complaints today.  REVIEW OF SYSTEMS:   Review of Systems    Constitutional: Positive for malaise/fatigue and weight loss. Negative for fever.  Respiratory: Negative.  Negative for cough, hemoptysis and shortness of breath.   Cardiovascular: Negative.  Negative for chest pain and leg swelling.  Gastrointestinal: Negative.  Negative for abdominal pain.  Genitourinary: Negative.  Negative for dysuria.  Musculoskeletal: Positive for back pain and joint pain. Negative for myalgias.  Skin: Negative.  Negative for rash.  Neurological: Positive for dizziness, speech change and weakness. Negative for focal weakness and headaches.  Psychiatric/Behavioral: Negative.  The patient is not nervous/anxious.     As per HPI. Otherwise, a complete review of systems is negative.  PAST MEDICAL HISTORY: Past Medical History:  Diagnosis Date   Acute diverticulitis    Anemia    Anxiety    AVM (arteriovenous malformation) of colon    Cataract    Depression    Diverticulosis 10/2009   Pandiverticulosis, per colonoscopy, Dr. Oneida Alar   DJD (degenerative joint disease)    Emphysema    GERD (gastroesophageal reflux disease)    Hypertension    Internal hemorrhoids    Rectal bleeding    Secondary to diverticulosis, hemorrhoids, and/or cecal AVMs   Wrist fracture     PAST SURGICAL HISTORY: Past Surgical History:  Procedure Laterality Date   CATARACT EXTRACTION Bilateral 2017   COLONOSCOPY  Oct 2011   Dr. Oneida Alar: pancolonic diverticulosis, 2 cecal AVMs s/p ablation, retained stool in left colon, moderate internal hemorrhoids   ESOPHAGOGASTRODUODENOSCOPY Left 08/10/2012   Procedure: ESOPHAGOGASTRODUODENOSCOPY (EGD);  Surgeon: Daneil Dolin, MD;  Location: [facility];  Service: Endoscopy;  Laterality: Left;  EGD for abdominal pain and GIB   Right total knee arthroplasty     X2  FAMILY HISTORY: Family History  Problem Relation Age of Onset   Asthma Mother    Cancer Sister        stomach   Heart attack Brother    Epilepsy Brother     Colon cancer Neg Hx     ADVANCED DIRECTIVES (Y/N):  N  HEALTH MAINTENANCE: Social History   Tobacco Use   Smoking status: Former Smoker    Packs/day: 1.00    Years: 65.00    Pack years: 65.00    Last attempt to quit: 10/26/2006    Years since quitting: 11.5   Smokeless tobacco: Never Used  Substance Use Topics   Alcohol use: No   Drug use: No     Colonoscopy:  PAP:  Bone density:  Lipid panel:  Allergies  Allergen Reactions   Avelox [Moxifloxacin Hcl In Nacl]    Codeine Nausea And Vomiting    Current Outpatient Medications  Medication Sig Dispense Refill   acetaminophen (TYLENOL) 500 MG tablet Take 1-2 tablets (500-1,000 mg total) by mouth daily. 30 tablet 0   albuterol (PROAIR HFA) 108 (90 Base) MCG/ACT inhaler INHALE 2 PUFFS INTO THE LUNGS EVERY 6 (SIX) HOURS AS NEEDED FOR WHEEZING OR SHORTNESS OF BREATH. 8.5 Inhaler 4   amLODipine (NORVASC) 5 MG tablet TAKE 1 TABLET BY MOUTH EVERY DAY 90 tablet 1   DULoxetine (CYMBALTA) 20 MG capsule Take 2 capsules (40 mg total) by mouth daily. 180 capsule 1   gabapentin (NEURONTIN) 300 MG capsule TAKE 1 CAPSULE BY MOUTH THREE TIMES A DAY 270 capsule 1   meclizine (ANTIVERT) 25 MG tablet TAKE 1 TABLET BY MOUTH TWICE A DAY 180 tablet 0   naproxen (NAPROSYN) 375 MG tablet Take 1 tablet (375 mg total) by mouth 2 (two) times daily with a meal. 20 tablet 0   risedronate (ACTONEL) 150 MG tablet TAKE 1 TABLET EVERY 30 DAYS WITH WATER ON EMPTY STOMACH,DO NOT EAT,DRINK OR LIE DOWN FOR NEXT30 MINS 3 tablet 1   HYDROcodone-acetaminophen (NORCO) 5-325 MG tablet Take 1-2 tablets by mouth every 6 (six) hours as needed for moderate pain. 60 tablet 0   megestrol (MEGACE) 40 MG tablet Take 1 tablet (40 mg total) by mouth daily. 30 tablet 2   No current facility-administered medications for this visit.     OBJECTIVE: There were no vitals filed for this visit.   There is no height or weight on file to calculate BMI.    ECOG FS:2  - Symptomatic, <50% confined to bed  General: Well-developed, well-nourished, no acute distress. HEENT: Normocephalic, moist mucous membranes. Neuro: Alert. Cranial nerves grossly intact. Skin: No rashes or petechiae noted. Psych: Normal affect. Lymphatics: No cervical, calvicular, axillary or inguinal LAD.  LAB RESULTS:  Lab Results  Component Value Date   NA 139 04/16/2018   K 4.5 04/16/2018   CL 99 04/16/2018   CO2 24 04/16/2018   GLUCOSE 95 04/16/2018   BUN 7 (L) 04/16/2018   CREATININE 0.91 04/16/2018   CALCIUM 12.3 (H) 04/16/2018   PROT 7.7 04/16/2018   ALBUMIN 4.4 04/16/2018   AST 18 04/16/2018   ALT 7 04/16/2018   ALKPHOS 82 04/16/2018   BILITOT 0.5 04/16/2018   GFRNONAA 57 (L) 04/16/2018   GFRAA 66 04/16/2018    Lab Results  Component Value Date   WBC 11.2 (H) 04/30/2018   NEUTROABS 6.0 09/28/2017   HGB 13.6 04/30/2018   HCT 43.4 04/30/2018   MCV 92.1 04/30/2018   PLT 495 (H) 04/30/2018  STUDIES: Ct Chest W Contrast  Result Date: 04/16/2018 CLINICAL DATA:  Unintended weight loss, abdominal and left groin pain, decreased appetite, fatigue EXAM: CT CHEST, ABDOMEN, AND PELVIS WITH CONTRAST TECHNIQUE: Multidetector CT imaging of the chest, abdomen and pelvis was performed following the standard protocol during bolus administration of intravenous contrast. CONTRAST:  126mL ISOVUE-300 IOPAMIDOL (ISOVUE-300) INJECTION 61% Creatinine was obtained on site at [facility] at [address] Results: Creatinine 0.8 mg/dL. GFR 77 COMPARISON:  11/15/2014 FINDINGS: CT CHEST FINDINGS Cardiovascular: Coronary and aortic calcified plaque. Heart size normal. No pericardial effusion. Mediastinum/Nodes: No hilar or mediastinal adenopathy. Lungs/Pleura: Multiple left pleural masses. Largest 6.7 cm posteriorly, with central low attenuation suggesting necrosis, and chest wall invasion involving the posterior aspect of the left sixth and tenth ribs. 2.7 cm nodule along the  left major fissure at the level of left pulmonary artery. 3.7 cm nodule at the lateral aspect of the left major fissure at the level of the aortic arch. Pulmonary emphysema. 0.7 cm subpleural ground-glass attenuation lesion in the superior segment right lower lobe. 0.6 cm subpleural ground-glass attenuation lesion laterally in the right upper lobe image 39/7. 1.5 cm sub solid lesion in the anterior left upper lobe image 46/7. No pleural effusion. No pneumothorax. Musculoskeletal: Direct invasion of the posterior aspect left sixth and tenth ribs by the pleural lesion as above. No acute fracture. CT ABDOMEN PELVIS FINDINGS Hepatobiliary: No focal liver abnormality is seen. Status post cholecystectomy. No biliary dilatation. Pancreas: Mild diffuse parenchymal atrophy without ductal dilatation. Scattered coarse calcifications in the uncinate process suggesting chronic pancreatitis. Spleen: Normal in size without focal abnormality. Adrenals/Urinary Tract: 2.6 cm right adrenal mass, present since 11/15/2014. Left adrenal unremarkable. Subcentimeter probable cysts in the right kidney lower pole. Left kidney unremarkable. No hydronephrosis. Urinary bladder incompletely distended. Stomach/Bowel: Stomach and small bowel are nondistended. Appendix surgically absent. Colon is nondilated with multiple diverticula most numerous in the descending segment, without significant adjacent inflammatory/edematous change or abscess. Vascular/Lymphatic: Mild aortoiliac calcified atheromatous plaque without aneurysm. Refluxing bilateral ovarian veins. Portal vein patent. No abdominal or pelvic adenopathy. Reproductive: Uterus and bilateral adnexa are unremarkable. Other: No ascites. No free air. Musculoskeletal: Multilevel facet DJD in the lower lumbar spine. No fracture or worrisome bone lesion. IMPRESSION: 1. Multiple large left pleural masses with chest wall invasion, may represent primary lesion such as mesothelioma versus metastatic  disease. The dominant moiety would be approachable for percutaneous biopsy if needed. 2. Bilateral sub solid pulmonary nodules as above, possibly infectious/inflammatory but nonspecific. Attention on follow-up imaging recommended to exclude neoplasm. 3. 2.6 cm right adrenal mass, stable since 11/15/2014, presumably benign adenoma. 4. Extensive colonic diverticulosis. 5. Coronary and Aortic Atherosclerosis (ICD10-I70.0). Electronically Signed   By: Lucrezia Europe M.D.   On: 04/16/2018 15:17   Ct Abdomen Pelvis W Contrast  Result Date: 04/16/2018 CLINICAL DATA:  Unintended weight loss, abdominal and left groin pain, decreased appetite, fatigue EXAM: CT CHEST, ABDOMEN, AND PELVIS WITH CONTRAST TECHNIQUE: Multidetector CT imaging of the chest, abdomen and pelvis was performed following the standard protocol during bolus administration of intravenous contrast. CONTRAST:  155mL ISOVUE-300 IOPAMIDOL (ISOVUE-300) INJECTION 61% Creatinine was obtained on site at [facility] at [address] Results: Creatinine 0.8 mg/dL. GFR 77 COMPARISON:  11/15/2014 FINDINGS: CT CHEST FINDINGS Cardiovascular: Coronary and aortic calcified plaque. Heart size normal. No pericardial effusion. Mediastinum/Nodes: No hilar or mediastinal adenopathy. Lungs/Pleura: Multiple left pleural masses. Largest 6.7 cm posteriorly, with central low attenuation suggesting necrosis, and  chest wall invasion involving the posterior aspect of the left sixth and tenth ribs. 2.7 cm nodule along the left major fissure at the level of left pulmonary artery. 3.7 cm nodule at the lateral aspect of the left major fissure at the level of the aortic arch. Pulmonary emphysema. 0.7 cm subpleural ground-glass attenuation lesion in the superior segment right lower lobe. 0.6 cm subpleural ground-glass attenuation lesion laterally in the right upper lobe image 39/7. 1.5 cm sub solid lesion in the anterior left upper lobe image 46/7. No pleural effusion. No  pneumothorax. Musculoskeletal: Direct invasion of the posterior aspect left sixth and tenth ribs by the pleural lesion as above. No acute fracture. CT ABDOMEN PELVIS FINDINGS Hepatobiliary: No focal liver abnormality is seen. Status post cholecystectomy. No biliary dilatation. Pancreas: Mild diffuse parenchymal atrophy without ductal dilatation. Scattered coarse calcifications in the uncinate process suggesting chronic pancreatitis. Spleen: Normal in size without focal abnormality. Adrenals/Urinary Tract: 2.6 cm right adrenal mass, present since 11/15/2014. Left adrenal unremarkable. Subcentimeter probable cysts in the right kidney lower pole. Left kidney unremarkable. No hydronephrosis. Urinary bladder incompletely distended. Stomach/Bowel: Stomach and small bowel are nondistended. Appendix surgically absent. Colon is nondilated with multiple diverticula most numerous in the descending segment, without significant adjacent inflammatory/edematous change or abscess. Vascular/Lymphatic: Mild aortoiliac calcified atheromatous plaque without aneurysm. Refluxing bilateral ovarian veins. Portal vein patent. No abdominal or pelvic adenopathy. Reproductive: Uterus and bilateral adnexa are unremarkable. Other: No ascites. No free air. Musculoskeletal: Multilevel facet DJD in the lower lumbar spine. No fracture or worrisome bone lesion. IMPRESSION: 1. Multiple large left pleural masses with chest wall invasion, may represent primary lesion such as mesothelioma versus metastatic disease. The dominant moiety would be approachable for percutaneous biopsy if needed. 2. Bilateral sub solid pulmonary nodules as above, possibly infectious/inflammatory but nonspecific. Attention on follow-up imaging recommended to exclude neoplasm. 3. 2.6 cm right adrenal mass, stable since 11/15/2014, presumably benign adenoma. 4. Extensive colonic diverticulosis. 5. Coronary and Aortic Atherosclerosis (ICD10-I70.0). Electronically Signed   By: Lucrezia Europe M.D.   On: 04/16/2018 15:17   Ct Biopsy  Result Date: 04/30/2018 INDICATION: Left multifocal pleural mass EXAM: CT-GUIDED LEFT PLEURAL MASS BIOPSY MEDICATIONS: None. ANESTHESIA/SEDATION: Moderate (conscious) sedation was employed during this procedure. A total of Fentanyl 50 mcg was administered intravenously. Moderate Sedation Time: 7 minutes. The patient's level of consciousness and vital signs were monitored continuously by radiology nursing throughout the procedure under my direct supervision. FLUOROSCOPY TIME:  Not applicable COMPLICATIONS: None immediate. PROCEDURE: Informed written consent was obtained from the patient after a thorough discussion of the procedural risks, benefits and alternatives. All questions were addressed. Maximal Sterile Barrier Technique was utilized including caps, mask, sterile gowns, sterile gloves, sterile drape, hand hygiene and skin antiseptic. A timeout was performed prior to the initiation of the procedure. Patient was brought to the CT suite and prepped and draped the usual sterile manner. Utilizing 1% xylocaine as local anesthetic and CT fluoroscopic guidance, a 17 gauge guiding needle was placed adjacent to one of the foci of the multifocal left pleural base mass. Multiple 18 gauge core biopsies were then obtained. The guiding needle was then removed and the puncture site dressed in the standard sterile manner. The patient tolerated the procedure well and was returned to her room in satisfactory condition. IMPRESSION: Successful CT-guided left pleural biopsy Electronically Signed   By: Inez Catalina M.D.   On: 04/30/2018 12:11    ASSESSMENT: Stage IV squamous cell carcinoma of left lung.  PLAN:    1.  Stage IV squamous cell carcinoma of left lung: CT scan results from April 16, 2018 reviewed independently and reported as above with multiple left lung pleural masses and chest wall invasion.  Biopsy consistent with squamous cell carcinoma.  Patient is still  unclear whether she wishes to pursue treatment or not, but wishes to proceed with PET scan as well as MRI of the brain to complete the staging work-up.  Given patient's decreased performance status and advanced age, treatment would be difficult.  Will follow-up with another WebEx visit several days after her imaging is complete to discuss the results and treatment planning if possible.  2.  Dizziness: MRI of the brain as above. 3.  Poor appetite/weight loss: Likely secondary to underlying malignancy.  Patient was given a prescription for Megace today and will consider dietary consult in the future. 4.  Pain: Patient was given a prescription for hydrocodone today.    I provided 30 minutes of face-to-face video visit time during this encounter, and > 50% was spent counseling as documented under my assessment & plan.  Patient expressed understanding and was in agreement with this plan. She also understands that She can call clinic at any time with any questions, concerns, or complaints.   Cancer Staging Squamous cell carcinoma of left lung [facility]) Staging form: Lung, AJCC 8th Edition - Clinical stage from 05/07/2018: Stage IVA (cT4, cN2, cM1a) - Signed by Lloyd Huger, MD on 05/07/2018   Lloyd Huger, MD   05/07/2018 2:39 PM

## 2018-05-07 ENCOUNTER — Inpatient Hospital Stay (HOSPITAL_BASED_OUTPATIENT_CLINIC_OR_DEPARTMENT_OTHER): Payer: Medicare Other | Admitting: Oncology

## 2018-05-07 ENCOUNTER — Encounter: Payer: Self-pay | Admitting: Oncology

## 2018-05-07 DIAGNOSIS — R630 Anorexia: Secondary | ICD-10-CM | POA: Diagnosis not present

## 2018-05-07 DIAGNOSIS — R42 Dizziness and giddiness: Secondary | ICD-10-CM | POA: Diagnosis not present

## 2018-05-07 DIAGNOSIS — G893 Neoplasm related pain (acute) (chronic): Secondary | ICD-10-CM

## 2018-05-07 DIAGNOSIS — R634 Abnormal weight loss: Secondary | ICD-10-CM

## 2018-05-07 DIAGNOSIS — C3492 Malignant neoplasm of unspecified part of left bronchus or lung: Secondary | ICD-10-CM

## 2018-05-07 MED ORDER — MEGESTROL ACETATE 40 MG PO TABS: 40.0000 mg | ORAL_TABLET | Freq: Every day | ORAL | 2 refills | Status: AC

## 2018-05-07 MED ORDER — HYDROCODONE-ACETAMINOPHEN 5-325 MG PO TABS: 1.0000 | ORAL_TABLET | Freq: Four times a day (QID) | ORAL | 0 refills | Status: DC | PRN

## 2018-05-07 NOTE — Progress Notes (Signed)
Webex visit today for biopsy results. Spoke with patients daughter to confirm current medications and verify they have everything they need to do webex visit with patient this morning.

## 2018-05-08 ENCOUNTER — Encounter: Payer: Self-pay | Admitting: *Deleted

## 2018-05-08 NOTE — Progress Notes (Signed)
Oncology Nurse Navigator Documentation  Oncology Nurse Navigator Flowsheets 05/08/2018  Navigator Location [facility]-  Referral date to RadOnc/MedOnc -  Navigator Encounter Type Other/I received referral today on [name].  Patient is getting MRI [name] and PET scan next week.  I updated new patient coordinator to call and schedule her to be seen with Dr. Julien Nordmann after scans on 05/20/2018 with labs.  I will update Dr. Julien Nordmann on referral.   Telephone -  Abnormal Finding Date -  Patient Visit Type -  Treatment Phase Pre-Tx/Tx Discussion  Barriers/Navigation Needs Coordination of Care  Interventions Coordination of Care  Coordination of Care Other  Acuity Level 3  Acuity Level 2 -  Time Spent with Patient 30

## 2018-05-09 ENCOUNTER — Other Ambulatory Visit: Payer: Medicare Other

## 2018-05-09 ENCOUNTER — Telehealth: Payer: Self-pay | Admitting: Internal Medicine

## 2018-05-09 NOTE — Telephone Encounter (Signed)
Received a staff msg from thoracic navigator to schedule pt to see Dr. Julien Nordmann on 5/11 at 2:15pm w/labs at 1:45pm. I cld and spoke to Ms. Mcisaac's daughter Gerhard Perches and provided the appt date and time.

## 2018-05-14 ENCOUNTER — Telehealth: Payer: Self-pay | Admitting: Medical Oncology

## 2018-05-14 ENCOUNTER — Encounter
Admission: RE | Admit: 2018-05-14 | Discharge: 2018-05-14 | Disposition: A | Discharge status: Home / Self Care | Payer: Medicare Other | Source: Ambulatory Visit | Attending: Oncology | Admitting: Oncology

## 2018-05-14 ENCOUNTER — Other Ambulatory Visit: Payer: Self-pay

## 2018-05-14 ENCOUNTER — Other Ambulatory Visit: Payer: Self-pay | Admitting: Anatomic Pathology & Clinical Pathology

## 2018-05-14 ENCOUNTER — Encounter: Payer: Self-pay | Admitting: Oncology

## 2018-05-14 DIAGNOSIS — K5730 Diverticulosis of large intestine without perforation or abscess without bleeding: Secondary | ICD-10-CM | POA: Diagnosis not present

## 2018-05-14 DIAGNOSIS — C3492 Malignant neoplasm of unspecified part of left bronchus or lung: Secondary | ICD-10-CM | POA: Diagnosis not present

## 2018-05-14 DIAGNOSIS — I2510 Atherosclerotic heart disease of native coronary artery without angina pectoris: Secondary | ICD-10-CM | POA: Insufficient documentation

## 2018-05-14 DIAGNOSIS — I700 Atherosclerosis of aorta: Secondary | ICD-10-CM | POA: Insufficient documentation

## 2018-05-14 DIAGNOSIS — R918 Other nonspecific abnormal finding of lung field: Secondary | ICD-10-CM | POA: Diagnosis not present

## 2018-05-14 DIAGNOSIS — D3501 Benign neoplasm of right adrenal gland: Secondary | ICD-10-CM | POA: Insufficient documentation

## 2018-05-14 DIAGNOSIS — C7951 Secondary malignant neoplasm of bone: Secondary | ICD-10-CM | POA: Diagnosis not present

## 2018-05-14 LAB — GLUCOSE, CAPILLARY: Glucose-Capillary: 96 mg/dL (ref 70–99)

## 2018-05-14 MED ORDER — FLUDEOXYGLUCOSE F - 18 (FDG) INJECTION
8.6000 | Freq: Once | INTRAVENOUS | Status: AC | PRN
  Administered 2018-05-14: 8.97 via INTRAVENOUS

## 2018-05-14 NOTE — Telephone Encounter (Signed)
Daughter , Glorious Peach , wants to listen to visit on may 11th . Have pt call [phone]

## 2018-05-16 ENCOUNTER — Other Ambulatory Visit: Payer: Self-pay

## 2018-05-16 ENCOUNTER — Ambulatory Visit
Admission: RE | Admit: 2018-05-16 | Discharge: 2018-05-16 | Disposition: A | Discharge status: Home / Self Care | Payer: Medicare Other | Source: Ambulatory Visit | Attending: Oncology | Admitting: Oncology

## 2018-05-16 DIAGNOSIS — C3492 Malignant neoplasm of unspecified part of left bronchus or lung: Secondary | ICD-10-CM | POA: Diagnosis not present

## 2018-05-16 DIAGNOSIS — C3490 Malignant neoplasm of unspecified part of unspecified bronchus or lung: Secondary | ICD-10-CM | POA: Diagnosis not present

## 2018-05-16 MED ORDER — GADOBUTROL 1 MMOL/ML IV SOLN
7.0000 mL | Freq: Once | INTRAVENOUS | Status: AC | PRN
  Administered 2018-05-16: 7 mL via INTRAVENOUS

## 2018-05-17 ENCOUNTER — Other Ambulatory Visit: Payer: Self-pay | Admitting: *Deleted

## 2018-05-17 ENCOUNTER — Ambulatory Visit: Payer: Medicare Other | Admitting: Oncology

## 2018-05-17 ENCOUNTER — Telehealth: Payer: Self-pay | Admitting: *Deleted

## 2018-05-17 DIAGNOSIS — C3492 Malignant neoplasm of unspecified part of left bronchus or lung: Secondary | ICD-10-CM

## 2018-05-17 NOTE — Telephone Encounter (Signed)
Received call from pt's daughter, [name].  She is concerned about her mother's appt on Monday @ 2:15.  Her mother is elderly and will need NT assist starting at registration with w/c.  Also to remind Dr. Julien Nordmann to call pt's daughter @ # listed in appt notes during the visit. Additional note placed in appt for NT assist

## 2018-05-20 ENCOUNTER — Telehealth: Payer: Self-pay | Admitting: Family Medicine

## 2018-05-20 ENCOUNTER — Encounter: Payer: Self-pay | Admitting: Internal Medicine

## 2018-05-20 ENCOUNTER — Other Ambulatory Visit: Payer: Self-pay | Admitting: Family Medicine

## 2018-05-20 ENCOUNTER — Inpatient Hospital Stay: Payer: Medicare Other | Attending: Internal Medicine | Admitting: Internal Medicine

## 2018-05-20 ENCOUNTER — Telehealth: Payer: Self-pay | Admitting: Medical Oncology

## 2018-05-20 ENCOUNTER — Encounter: Payer: Self-pay | Admitting: *Deleted

## 2018-05-20 ENCOUNTER — Inpatient Hospital Stay: Payer: Medicare Other

## 2018-05-20 ENCOUNTER — Other Ambulatory Visit: Payer: Self-pay

## 2018-05-20 ENCOUNTER — Other Ambulatory Visit: Payer: Self-pay | Admitting: Medical Oncology

## 2018-05-20 ENCOUNTER — Telehealth: Payer: Self-pay | Admitting: *Deleted

## 2018-05-20 VITALS — BP 133/73 | HR 85

## 2018-05-20 VITALS — BP 124/71 | HR 98 | Temp 98.3°F | Resp 18 | Ht 67.0 in | Wt 148.4 lb

## 2018-05-20 DIAGNOSIS — R112 Nausea with vomiting, unspecified: Secondary | ICD-10-CM | POA: Diagnosis not present

## 2018-05-20 DIAGNOSIS — I2510 Atherosclerotic heart disease of native coronary artery without angina pectoris: Secondary | ICD-10-CM | POA: Diagnosis not present

## 2018-05-20 DIAGNOSIS — Z7951 Long term (current) use of inhaled steroids: Secondary | ICD-10-CM

## 2018-05-20 DIAGNOSIS — I10 Essential (primary) hypertension: Secondary | ICD-10-CM

## 2018-05-20 DIAGNOSIS — E876 Hypokalemia: Secondary | ICD-10-CM | POA: Insufficient documentation

## 2018-05-20 DIAGNOSIS — F418 Other specified anxiety disorders: Secondary | ICD-10-CM | POA: Diagnosis not present

## 2018-05-20 DIAGNOSIS — R51 Headache: Secondary | ICD-10-CM

## 2018-05-20 DIAGNOSIS — K219 Gastro-esophageal reflux disease without esophagitis: Secondary | ICD-10-CM

## 2018-05-20 DIAGNOSIS — Z79899 Other long term (current) drug therapy: Secondary | ICD-10-CM | POA: Diagnosis not present

## 2018-05-20 DIAGNOSIS — G8929 Other chronic pain: Secondary | ICD-10-CM

## 2018-05-20 DIAGNOSIS — M545 Low back pain: Secondary | ICD-10-CM | POA: Diagnosis not present

## 2018-05-20 DIAGNOSIS — Z5111 Encounter for antineoplastic chemotherapy: Secondary | ICD-10-CM

## 2018-05-20 DIAGNOSIS — K5900 Constipation, unspecified: Secondary | ICD-10-CM

## 2018-05-20 DIAGNOSIS — Z87891 Personal history of nicotine dependence: Secondary | ICD-10-CM

## 2018-05-20 DIAGNOSIS — Z800 Family history of malignant neoplasm of digestive organs: Secondary | ICD-10-CM | POA: Diagnosis not present

## 2018-05-20 DIAGNOSIS — C3492 Malignant neoplasm of unspecified part of left bronchus or lung: Secondary | ICD-10-CM

## 2018-05-20 DIAGNOSIS — R2689 Other abnormalities of gait and mobility: Secondary | ICD-10-CM | POA: Diagnosis not present

## 2018-05-20 DIAGNOSIS — J449 Chronic obstructive pulmonary disease, unspecified: Secondary | ICD-10-CM

## 2018-05-20 DIAGNOSIS — R634 Abnormal weight loss: Secondary | ICD-10-CM | POA: Diagnosis not present

## 2018-05-20 DIAGNOSIS — R42 Dizziness and giddiness: Secondary | ICD-10-CM | POA: Diagnosis not present

## 2018-05-20 DIAGNOSIS — Z7189 Other specified counseling: Secondary | ICD-10-CM

## 2018-05-20 DIAGNOSIS — Z791 Long term (current) use of non-steroidal anti-inflammatories (NSAID): Secondary | ICD-10-CM

## 2018-05-20 DIAGNOSIS — R630 Anorexia: Secondary | ICD-10-CM | POA: Diagnosis not present

## 2018-05-20 DIAGNOSIS — R0789 Other chest pain: Secondary | ICD-10-CM

## 2018-05-20 DIAGNOSIS — R5383 Other fatigue: Secondary | ICD-10-CM

## 2018-05-20 LAB — CBC WITH DIFFERENTIAL (CANCER CENTER ONLY)
Abs Immature Granulocytes: 0.09 10*3/uL — ABNORMAL HIGH (ref 0.00–0.07)
Basophils Absolute: 0 10*3/uL (ref 0.0–0.1)
Basophils Relative: 0 %
Eosinophils Absolute: 0.3 10*3/uL (ref 0.0–0.5)
Eosinophils Relative: 2 %
HCT: 38.5 % (ref 36.0–46.0)
Hemoglobin: 12.4 g/dL (ref 12.0–15.0)
Immature Granulocytes: 1 %
Lymphocytes Relative: 7 %
Lymphs Abs: 1 10*3/uL (ref 0.7–4.0)
MCH: 28.4 pg (ref 26.0–34.0)
MCHC: 32.2 g/dL (ref 30.0–36.0)
MCV: 88.3 fL (ref 80.0–100.0)
Monocytes Absolute: 1.1 10*3/uL — ABNORMAL HIGH (ref 0.1–1.0)
Monocytes Relative: 7 %
Neutro Abs: 12.4 10*3/uL — ABNORMAL HIGH (ref 1.7–7.7)
Neutrophils Relative %: 83 %
Platelet Count: 603 10*3/uL — ABNORMAL HIGH (ref 150–400)
RBC: 4.36 MIL/uL (ref 3.87–5.11)
RDW: 14 % (ref 11.5–15.5)
WBC Count: 15 10*3/uL — ABNORMAL HIGH (ref 4.0–10.5)
nRBC: 0 % (ref 0.0–0.2)

## 2018-05-20 LAB — CMP (CANCER CENTER ONLY)
ALT: 15 U/L (ref 0–44)
AST: 20 U/L (ref 15–41)
Albumin: 2.9 g/dL — ABNORMAL LOW (ref 3.5–5.0)
Alkaline Phosphatase: 89 U/L (ref 38–126)
Anion gap: 12 (ref 5–15)
BUN: 9 mg/dL (ref 8–23)
CO2: 27 mmol/L (ref 22–32)
Calcium: 12.2 mg/dL — ABNORMAL HIGH (ref 8.9–10.3)
Chloride: 98 mmol/L (ref 98–111)
Creatinine: 0.89 mg/dL (ref 0.44–1.00)
GFR, Est AFR Am: >60 mL/min (ref >60)
GFR, Estimated: 58 mL/min — ABNORMAL LOW (ref >60)
Glucose, Bld: 111 mg/dL — ABNORMAL HIGH (ref 70–99)
Potassium: 2.8 mmol/L — CL (ref 3.5–5.1)
Sodium: 137 mmol/L (ref 135–145)
Total Bilirubin: 0.6 mg/dL (ref 0.3–1.2)
Total Protein: 8.2 g/dL — ABNORMAL HIGH (ref 6.5–8.1)

## 2018-05-20 MED ORDER — POTASSIUM CHLORIDE IN NACL 20-0.9 MEQ/L-% IV SOLN
Freq: Once | INTRAVENOUS | Status: AC
  Administered 2018-05-20: 16:00:00 via INTRAVENOUS
  Filled 2018-05-20: qty 1000

## 2018-05-20 MED ORDER — ALBUTEROL SULFATE HFA 108 (90 BASE) MCG/ACT IN AERS: INHALATION_SPRAY | RESPIRATORY_TRACT | 4 refills | Status: AC

## 2018-05-20 MED ORDER — DEXAMETHASONE 4 MG PO TABS: 4.0000 mg | ORAL_TABLET | Freq: Two times a day (BID) | ORAL | 0 refills | Status: AC

## 2018-05-20 MED ORDER — SODIUM CHLORIDE 0.9 % IV SOLN
INTRAVENOUS | Status: DC
  Filled 2018-05-20: qty 1000

## 2018-05-20 MED ORDER — ZOLEDRONIC ACID 4 MG/100ML IV SOLN
4.0000 mg | Freq: Once | INTRAVENOUS | Status: AC
  Administered 2018-05-20: 4 mg via INTRAVENOUS
  Filled 2018-05-20: qty 100

## 2018-05-20 MED ORDER — SODIUM CHLORIDE 0.9 % IV SOLN: 20.0000 meq | Freq: Once | INTRAVENOUS | Status: DC

## 2018-05-20 MED ORDER — SODIUM CHLORIDE 0.9 % IV SOLN
Freq: Once | INTRAVENOUS | Status: AC
  Administered 2018-05-20: 16:00:00 via INTRAVENOUS
  Filled 2018-05-20: qty 250

## 2018-05-20 MED ORDER — HYDROCODONE-ACETAMINOPHEN 5-325 MG PO TABS: 1.0000 | ORAL_TABLET | Freq: Four times a day (QID) | ORAL | 0 refills | Status: AC | PRN

## 2018-05-20 NOTE — Telephone Encounter (Signed)
Told hospice nurse that family will contact Dr Lajuana Ripple to be referring provider for Hospice. Message sent to Dr Lajuana Ripple.

## 2018-05-20 NOTE — Progress Notes (Signed)
[place] [phone]   [phone]  CONSULT NOTE  REFERRING PHYSICIAN: Dr. Delight Hoh  REASON FOR CONSULTATION:  85 years old white female recently diagnosed with lung cancer.  HPI [name] is a 85 y.o. female with past medical history significant for hypertension, COPD, diverticulitis, GERD, depression, anemia and anxiety as well as long history of smoking.  The patient mentioned that she has been complaining of balance problem as well as pain on the left side of the chest for several weeks.  She was seen by her primary care physician and CT scan of the chest, abdomen and pelvis were performed on 04/16/2018 and that showed multiple large left pleural masses with chest wall invasion may represent primary lesion such as mesothelioma versus metastatic disease.  There was also bilateral sub-solid pulmonary nodules and 2.6 cm right adrenal mass stable since 2016 consistent with benign adenoma.  On 04/30/2018 the patient had CT-guided core biopsy of left pleural mass.  The final pathology ([id number]) showed invasive carcinoma most consistent with a squamous cell carcinoma.  PDL 1 expression was negative.  The patient was seen by Dr. Grayland Ormond and she had a PET scan 05/14/2018 and it showed extensive hypermetabolic left malignancy with invasion of the chest wall along the sixth rib, 10th rib and in the ninth intercostal space.  There was atelectasis in the left lower lobe primary tumor in the left lower lobe is difficult to exclude.  The scan also showed metastatic lesion involving the posterior elements and paraspinal tissues and T5 and T6 level.  Intraspinal extension is not definitely shown but difficult to exclude.  There was also to right and single left groundglass density nodules that are stable and consistent with low-grade adenocarcinoma. The patient also had MRI of the brain performed on 05/16/2018 and it was negative for malignancy. The patient  lives in [place] and she was referred to me today for evaluation and recommendation regarding treatment of her condition. When seen today she is still complaining of dizzy spells as well as headache and left-sided chest pain as well as low back pain.  She is also complaining of shortness of breath with exertion and dry cough.  She lost around 40 pounds in the last 12 months.  She has nausea and vomiting as well as constipation intermittently.  She denied having any fever or chills. Family history significant for mother died from asthma attack and sister had stomach cancer. Social history the patient is a widow and had 8 children, 1 deceased and 7 living.  Her son [name] and her daughter [name] were available by phone during the visit.  The patient was a housewife and she has a history for smoking for over 65 years and quit 2 years ago.  She has no history of alcohol or drug abuse.  HPI  Past Medical History:  Diagnosis Date   Acute diverticulitis    Anemia    Anxiety    AVM (arteriovenous malformation) of colon    Cataract    Depression    Diverticulosis 10/2009   Pandiverticulosis, per colonoscopy, Dr. Oneida Alar   DJD (degenerative joint disease)    Emphysema    GERD (gastroesophageal reflux disease)    Hypertension    Internal hemorrhoids    Rectal bleeding    Secondary to diverticulosis, hemorrhoids, and/or cecal AVMs   Wrist fracture     Past Surgical History:  Procedure Laterality Date   CATARACT EXTRACTION Bilateral 2017   COLONOSCOPY  Oct 2011   Dr. Oneida Alar: pancolonic diverticulosis, 2 cecal AVMs s/p ablation, retained stool in left colon, moderate internal hemorrhoids   ESOPHAGOGASTRODUODENOSCOPY Left 08/10/2012   Procedure: ESOPHAGOGASTRODUODENOSCOPY (EGD);  Surgeon: Daneil Dolin, MD;  Location: [facility];  Service: Endoscopy;  Laterality: Left;  EGD for abdominal pain and GIB   Right total knee arthroplasty     X2    Family History  Problem Relation  Age of Onset   Asthma Mother    Cancer Sister        stomach   Heart attack Brother    Epilepsy Brother    Colon cancer Neg Hx     Social History Social History   Tobacco Use   Smoking status: Former Smoker    Packs/day: 1.00    Years: 65.00    Pack years: 65.00    Last attempt to quit: 10/26/2006    Years since quitting: 11.5   Smokeless tobacco: Never Used  Substance Use Topics   Alcohol use: No   Drug use: No    Allergies  Allergen Reactions   Avelox [Moxifloxacin Hcl In Nacl]    Codeine Nausea And Vomiting    Current Outpatient Medications  Medication Sig Dispense Refill   acetaminophen (TYLENOL) 500 MG tablet Take 1-2 tablets (500-1,000 mg total) by mouth daily. 30 tablet 0   albuterol (PROAIR HFA) 108 (90 Base) MCG/ACT inhaler INHALE 2 PUFFS INTO THE LUNGS EVERY 6 (SIX) HOURS AS NEEDED FOR WHEEZING OR SHORTNESS OF BREATH. 8.5 Inhaler 4   amLODipine (NORVASC) 5 MG tablet TAKE 1 TABLET BY MOUTH EVERY DAY 90 tablet 1   DULoxetine (CYMBALTA) 20 MG capsule Take 2 capsules (40 mg total) by mouth daily. 180 capsule 1   gabapentin (NEURONTIN) 300 MG capsule TAKE 1 CAPSULE BY MOUTH THREE TIMES A DAY 270 capsule 1   HYDROcodone-acetaminophen (NORCO) 5-325 MG tablet Take 1-2 tablets by mouth every 6 (six) hours as needed for moderate pain. 60 tablet 0   meclizine (ANTIVERT) 25 MG tablet TAKE 1 TABLET BY MOUTH TWICE A DAY 180 tablet 0   megestrol (MEGACE) 40 MG tablet Take 1 tablet (40 mg total) by mouth daily. 30 tablet 2   naproxen (NAPROSYN) 375 MG tablet Take 1 tablet (375 mg total) by mouth 2 (two) times daily with a meal. 20 tablet 0   risedronate (ACTONEL) 150 MG tablet TAKE 1 TABLET EVERY 30 DAYS WITH WATER ON EMPTY STOMACH,DO NOT EAT,DRINK OR LIE DOWN FOR NEXT30 MINS 3 tablet 1   No current facility-administered medications for this visit.     Review of Systems  Constitutional: positive for anorexia, fatigue and weight loss Eyes:  negative Ears, nose, mouth, throat, and face: negative Respiratory: positive for cough, dyspnea on exertion and pleurisy/chest pain Cardiovascular: negative Gastrointestinal: negative Genitourinary:negative Integument/breast: negative Hematologic/lymphatic: negative Musculoskeletal:positive for back pain Neurological: negative Behavioral/Psych: negative Endocrine: negative Allergic/Immunologic: negative  Physical Exam  JWJ:XBJYN, healthy, no distress, well nourished and well developed SKIN: skin color, texture, turgor are normal, no rashes or significant lesions HEAD: Normocephalic, No masses, lesions, tenderness or abnormalities EYES: normal, PERRLA, Conjunctiva are pink and non-injected EARS: External ears normal, Canals clear OROPHARYNX:no exudate, no erythema and lips, buccal mucosa, and tongue normal  NECK: supple, no adenopathy, no JVD LYMPH:  no palpable lymphadenopathy, no hepatosplenomegaly BREAST:not examined LUNGS: decreased breath sounds, expiratory wheezes bilaterally HEART: regular rate & rhythm, no murmurs and no gallops ABDOMEN:abdomen soft, non-tender, normal bowel sounds and no masses or  organomegaly BACK: No CVA tenderness, Range of motion is normal EXTREMITIES:no joint deformities, effusion, or inflammation, no edema  NEURO: alert & oriented x 3 with fluent speech, no focal motor/sensory deficits  PERFORMANCE STATUS: ECOG 2  LABORATORY DATA: Lab Results  Component Value Date   WBC 15.0 (H) 05/20/2018   HGB 12.4 05/20/2018   HCT 38.5 05/20/2018   MCV 88.3 05/20/2018   PLT 603 (H) 05/20/2018      Chemistry      Component Value Date/Time   NA 139 04/16/2018 1139   K 4.5 04/16/2018 1139   CL 99 04/16/2018 1139   CO2 24 04/16/2018 1139   BUN 7 (L) 04/16/2018 1139   CREATININE 0.91 04/16/2018 1139   CREATININE 0.96 05/14/2012 1207      Component Value Date/Time   CALCIUM 12.3 (H) 04/16/2018 1139   ALKPHOS 82 04/16/2018 1139   AST 18 04/16/2018  1139   ALT 7 04/16/2018 1139   BILITOT 0.5 04/16/2018 1139       RADIOGRAPHIC STUDIES: Mr Jeri Cos OA Contrast  Result Date: 05/16/2018 CLINICAL DATA:  Lung cancer staging. EXAM: MRI HEAD WITHOUT AND WITH CONTRAST TECHNIQUE: Multiplanar, multiecho pulse sequences of the brain and surrounding structures were obtained without and with intravenous contrast. CONTRAST:  7 mL Gadavist COMPARISON:  Head CT 11/15/2014 FINDINGS: Some sequences are mildly to moderately motion degraded. Brain: There is no evidence of acute infarct, intracranial hemorrhage, mass, midline shift, or extra-axial fluid collection. There is moderate cerebral atrophy. Cerebral white matter T2 hyperintensities are nonspecific but compatible with minimal chronic small vessel ischemic disease. No abnormal enhancement is identified, however very small lesions could be obscured by motion artifact. Vascular: Major intracranial vascular flow voids are preserved. Skull and upper cervical spine: No suspicious marrow lesion. Sinuses/Orbits: Bilateral cataract extraction. Paranasal sinuses and mastoid air cells are clear. Other: None. IMPRESSION: No evidence of intracranial metastases or acute abnormality on this motion degraded examination. Electronically Signed   By: Logan Bores M.D.   On: 05/16/2018 13:13   Nm Pet Image Initial (pi) Skull Base To Thigh  Result Date: 05/14/2018 CLINICAL DATA:  Initial treatment strategy for left lung squamous cell carcinoma. EXAM: NUCLEAR MEDICINE PET SKULL BASE TO THIGH TECHNIQUE: 9.0 mCi F-18 FDG was injected intravenously. Full-ring PET imaging was performed from the skull base to thigh after the radiotracer. CT data was obtained and used for attenuation correction and anatomic localization. Fasting blood glucose: 96 mg/dl COMPARISON:  CT chest from 04/16/2018 FINDINGS: Mediastinal blood pool activity: SUV max 2.2 NECK: Symmetric glottic activity, likely physiologic. Mild symmetric activity along the tongue  base, likewise probably physiologic. Incidental CT findings: none CHEST: Highly hypermetabolic extensive left pleural masses. The mass along the upper lateral portion of the left upper lobe is chosen as an index lesion because it is less confluent than the other lesions, and has a maximum SUV of 34.9. The other lesions are similarly extremely hypermetabolic, and there is associated left lower lobe atelectasis. I cannot exclude involvement of the left lower lobe by tumor right primary lesion in the left lower lobe; the pleural disease in this vicinity has a maximum SUV of 43.5. There is some associated invasion of the left tenth rib and left sixth rib and chest wall, as well as invasion of the ninth intercostal space posterolaterally. Incidental CT findings: Coronary, aortic arch, and branch vessel atherosclerotic vascular disease. Stable small ground-glass density nodules in the right lower lobe as characterized on chest CT of  04/16/2018. Stable left upper lobe ground-glass density nodule on image 72/3, likewise not appreciably changed from 04/16/2018 and not hypermetabolic. ABDOMEN/PELVIS: 2.4 by 2.7 cm right adrenal mass this some faint calcification but mostly low-density and with some fatty components, overall favoring adrenal adenoma, maximum SUV 2.9. Small amount of focal activity in the ascending a cold colon, maximum SUV 6.2, no CT correlate. Most likely physiologic. Incidental CT findings: Cholecystectomy. Aortoiliac atherosclerotic vascular disease. Speckled calcifications along the pancreatic head and uncinate process. Descending and sigmoid colon diverticulosis. SKELETON: There is a lytic mass of the right greater than left posterior elements at T5 and T6 extending in the paraspinal tissues, maximum SUV 33.1, compatible with a metastatic lesion. This lesion measures proximally 2.7 by 2.1 cm on image 71/3. Extension into the spinal canal is uncertain. Incidental CT findings: none IMPRESSION: 1. Extensive  hypermetabolic left pleural malignancy with invasion of the chest wall along the sixth rib, tenth rib, and in the ninth intercostal space. There is atelectasis in the left lower lobe and a primary tumor in the left lower lobe is difficult to exclude. 2. Metastatic lesion involving the posterior elements and paraspinal tissues at the T5 and T6 level. Intraspinal extension is not definitively shown but is difficult to confidently exclude, consider thoracic spine MRI with and without contrast. 3. Two right and single left ground-glass density nodules are stable. Low-grade adenocarcinoma is not excluded. While clearly the other findings are of more significant and immediate concern, surveillance of these ground-glass density nodules is suggested. 4. Other imaging findings of potential clinical significance: Aortic Atherosclerosis (ICD10-I70.0). Coronary atherosclerosis. Right adrenal adenoma. Descending and sigmoid colon diverticulosis. Electronically Signed   By: Van Clines M.D.   On: 05/14/2018 14:43   Ct Biopsy  Result Date: 04/30/2018 INDICATION: Left multifocal pleural mass EXAM: CT-GUIDED LEFT PLEURAL MASS BIOPSY MEDICATIONS: None. ANESTHESIA/SEDATION: Moderate (conscious) sedation was employed during this procedure. A total of Fentanyl 50 mcg was administered intravenously. Moderate Sedation Time: 7 minutes. The patient's level of consciousness and vital signs were monitored continuously by radiology nursing throughout the procedure under my direct supervision. FLUOROSCOPY TIME:  Not applicable COMPLICATIONS: None immediate. PROCEDURE: Informed written consent was obtained from the patient after a thorough discussion of the procedural risks, benefits and alternatives. All questions were addressed. Maximal Sterile Barrier Technique was utilized including caps, mask, sterile gowns, sterile gloves, sterile drape, hand hygiene and skin antiseptic. A timeout was performed prior to the initiation of the  procedure. Patient was brought to the CT suite and prepped and draped the usual sterile manner. Utilizing 1% xylocaine as local anesthetic and CT fluoroscopic guidance, a 17 gauge guiding needle was placed adjacent to one of the foci of the multifocal left pleural base mass. Multiple 18 gauge core biopsies were then obtained. The guiding needle was then removed and the puncture site dressed in the standard sterile manner. The patient tolerated the procedure well and was returned to her room in satisfactory condition. IMPRESSION: Successful CT-guided left pleural biopsy Electronically Signed   By: Inez Catalina M.D.   On: 04/30/2018 12:11    ASSESSMENT: This is a very pleasant 85 years old white female with metastatic non-small cell lung cancer, squamous cell carcinoma presented with left lower lobe lung mass in addition to extensive pleural-based as well as metastatic disease to the chest wall and thoracic vertebrae diagnosed in April 2020. PDL 1 expression is negative.  PLAN: I had a lengthy discussion with the patient and her son and daughter  were available by phone about her current disease stage, prognosis and treatment options. Unfortunately the patient has incurable condition and all the treatment will be of palliative nature. She is not a candidate for immunotherapy monotherapy because of negative PDL 1 expression. I discussed with the patient her treatment options including palliative care and hospice referral versus consideration of treatment evaluation of systemic chemotherapy with carboplatin, paclitaxel and Keytruda for 4 cycles followed by maintenance Keytruda.   The patient and her family are more interested in palliative care and hospice at this point.  They will get a referral from her primary care physician for the hospice service. For the back pain as well as the left-sided chest pain, I will refer the patient to radiation oncology for consideration of palliative course of radiotherapy.   I will give the patient a refill of her hydrocodone for now until she is evaluated by the hospice service.  I will also start her empirically on Decadron 4 mg p.o. 2 times daily for the next 2 weeks. For the hypercalcemia of malignancy, I will arrange for the patient to receive 1 L of normal saline in addition to Zometa intravenously in the clinic today. For the hypokalemia, she will receive potassium chloride intravenously with the IV fluid today. I will see the patient on as-needed basis. She knows to call if she has any concerning symptoms. The patient voices understanding of current disease status and treatment options and is in agreement with the current care plan.  All questions were answered. The patient knows to call the clinic with any problems, questions or concerns. We can certainly see the patient much sooner if necessary.  Thank you so much for allowing me to participate in the care of [name]. I will continue to follow up the patient with you and assist in her care.  I spent 55 minutes counseling the patient face to face. The total time spent in the appointment was 80 minutes.  Disclaimer: This note was dictated with voice recognition software. Similar sounding words can inadvertently be transcribed and may not be corrected upon review.   Eilleen Kempf May 20, 2018, 2:27 PM

## 2018-05-20 NOTE — Telephone Encounter (Signed)
Received call from the lab with critical K+ value of 2.8 and Ca++ of 12.2 with Albumin of 2.9 Spoke with Abelina Bachelor, RN and made her aware of lab results.  Pt is seeing Dr. Julien Nordmann this afternoon.

## 2018-05-20 NOTE — Progress Notes (Signed)
Per Dr. Julien Nordmann to add 50mEq K+ to IVF order.   Jalene Mullet, PharmD PGY2 Hematology/ Oncology Pharmacy Resident 05/20/2018 3:36 PM

## 2018-05-20 NOTE — Progress Notes (Signed)
Oncology Nurse Navigator Documentation  Oncology Nurse Navigator Flowsheets 05/20/2018  Navigator Location [facility]  Referral date to RadOnc/MedOnc -  Navigator Encounter Type Clinic/MDC/I spoke with [name] at cancer center today while her family was on the phone.  [name] has stage IV lung cancer and her treatment plan is palliative radiation and Hospice according to Dr. Julien Nordmann and patient.  I help to explain DX and treatment plan.    Telephone -  Abnormal Finding Date 04/16/2018  Confirmed Diagnosis Date 04/30/2018  Patient Visit Type MedOnc  Treatment Phase Pre-Tx/Tx Discussion  Barriers/Navigation Needs Education  Education Other  Interventions Education  Coordination of Care -  Education Method Verbal  Acuity Level 2  Acuity Level 2 -  Time Spent with Patient 85

## 2018-05-20 NOTE — Patient Instructions (Signed)
Zoledronic Acid injection (Hypercalcemia, Oncology) What is this medicine? ZOLEDRONIC ACID (ZOE le dron ik AS id) lowers the amount of calcium loss from bone. It is used to treat too much calcium in your blood from cancer. It is also used to prevent complications of cancer that has spread to the bone. This medicine may be used for other purposes; ask your health care provider or pharmacist if you have questions. COMMON BRAND NAME(S): Zometa What should I tell my health care provider before I take this medicine? They need to know if you have any of these conditions: -aspirin-sensitive asthma -cancer, especially if you are receiving medicines used to treat cancer -dental disease or wear dentures -infection -kidney disease -receiving corticosteroids like dexamethasone or prednisone -an unusual or allergic reaction to zoledronic acid, other medicines, foods, dyes, or preservatives -pregnant or trying to get pregnant -breast-feeding How should I use this medicine? This medicine is for infusion into a vein. It is given by a health care professional in a hospital or clinic setting. Talk to your pediatrician regarding the use of this medicine in children. Special care may be needed. Overdosage: If you think you have taken too much of this medicine contact a poison control center or emergency room at once. NOTE: This medicine is only for you. Do not share this medicine with others. What if I miss a dose? It is important not to miss your dose. Call your doctor or health care professional if you are unable to keep an appointment. What may interact with this medicine? -certain antibiotics given by injection -NSAIDs, medicines for pain and inflammation, like ibuprofen or naproxen -some diuretics like bumetanide, furosemide -teriparatide -thalidomide This list may not describe all possible interactions. Give your health care provider a list of all the medicines, herbs, non-prescription drugs, or  dietary supplements you use. Also tell them if you smoke, drink alcohol, or use illegal drugs. Some items may interact with your medicine. What should I watch for while using this medicine? Visit your doctor or health care professional for regular checkups. It may be some time before you see the benefit from this medicine. Do not stop taking your medicine unless your doctor tells you to. Your doctor may order blood tests or other tests to see how you are doing. Women should inform their doctor if they wish to become pregnant or think they might be pregnant. There is a potential for serious side effects to an unborn child. Talk to your health care professional or pharmacist for more information. You should make sure that you get enough calcium and vitamin D while you are taking this medicine. Discuss the foods you eat and the vitamins you take with your health care professional. Some people who take this medicine have severe bone, joint, and/or muscle pain. This medicine may also increase your risk for jaw problems or a broken thigh bone. Tell your doctor right away if you have severe pain in your jaw, bones, joints, or muscles. Tell your doctor if you have any pain that does not go away or that gets worse. Tell your dentist and dental surgeon that you are taking this medicine. You should not have major dental surgery while on this medicine. See your dentist to have a dental exam and fix any dental problems before starting this medicine. Take good care of your teeth while on this medicine. Make sure you see your dentist for regular follow-up appointments. What side effects may I notice from receiving this medicine? Side effects that   you should report to your doctor or health care professional as soon as possible: -allergic reactions like skin rash, itching or hives, swelling of the face, lips, or tongue -anxiety, confusion, or depression -breathing problems -changes in vision -eye pain -feeling faint or  lightheaded, falls -jaw pain, especially after dental work -mouth sores -muscle cramps, stiffness, or weakness -redness, blistering, peeling or loosening of the skin, including inside the mouth -trouble passing urine or change in the amount of urine Side effects that usually do not require medical attention (report to your doctor or health care professional if they continue or are bothersome): -bone, joint, or muscle pain -constipation -diarrhea -fever -hair loss -irritation at site where injected -loss of appetite -nausea, vomiting -stomach upset -trouble sleeping -trouble swallowing -weak or tired This list may not describe all possible side effects. Call your doctor for medical advice about side effects. You may report side effects to FDA at 1-800-FDA-1088. Where should I keep my medicine? This drug is given in a hospital or clinic and will not be stored at home. NOTE: This sheet is a summary. It may not cover all possible information. If you have questions about this medicine, talk to your doctor, pharmacist, or health care provider.  2019 Elsevier/Gold Standard (2013-05-24 14:19:39)  Hypokalemia Hypokalemia means that the amount of potassium in the blood is lower than normal.Potassium is a chemical that helps regulate the amount of fluid in the body (electrolyte). It also stimulates muscle tightening (contraction) and helps nerves work properly.Normally, most of the body's potassium is inside of cells, and only a very small amount is in the blood. Because the amount in the blood is so small, minor changes to potassium levels in the blood can be life-threatening. What are the causes? This condition may be caused by:  Antibiotic medicine.  Diarrhea or vomiting. Taking too much of a medicine that helps you have a bowel movement (laxative) can cause diarrhea and lead to hypokalemia.  Chronic kidney disease (CKD).  Medicines that help the body get rid of excess fluid  (diuretics).  Eating disorders, such as bulimia.  Low magnesium levels in the body.  Sweating a lot. What are the signs or symptoms? Symptoms of this condition include:  Weakness.  Constipation.  Fatigue.  Muscle cramps.  Mental confusion.  Skipped heartbeats or irregular heartbeat (palpitations).  Tingling or numbness. How is this diagnosed? This condition is diagnosed with a blood test. How is this treated? Hypokalemia can be treated by taking potassium supplements by mouth or adjusting the medicines that you take. Treatment may also include eating more foods that contain a lot of potassium. If your potassium level is very low, you may need to get potassium through an IV tube in one of your veins and be monitored in the hospital. Follow these instructions at home:   Take over-the-counter and prescription medicines only as told by your health care provider. This includes vitamins and supplements.  Eat a healthy diet. A healthy diet includes fresh fruits and vegetables, whole grains, healthy fats, and lean proteins.  If instructed, eat more foods that contain a lot of potassium, such as: ? Nuts, such as peanuts and pistachios. ? Seeds, such as sunflower seeds and pumpkin seeds. ? Peas, lentils, and lima beans. ? Whole grain and bran cereals and breads. ? Fresh fruits and vegetables, such as apricots, avocado, bananas, cantaloupe, kiwi, oranges, tomatoes, asparagus, and potatoes. ? Orange juice. ? Tomato juice. ? Red meats. ? Yogurt.  Keep all follow-up visits  as told by your health care provider. This is important. Contact a health care provider if:  You have weakness that gets worse.  You feel your heart pounding or racing.  You vomit.  You have diarrhea.  You have diabetes (diabetes mellitus) and you have trouble keeping your blood sugar (glucose) in your target range. Get help right away if:  You have chest pain.  You have shortness of breath.  You  have vomiting or diarrhea that lasts for more than 2 days.  You faint. This information is not intended to replace advice given to you by your health care provider. Make sure you discuss any questions you have with your health care provider. Document Released: 12/26/2004 Document Revised: 08/14/2015 Document Reviewed: 08/14/2015 Elsevier Interactive Patient Education  2019 [employer].

## 2018-05-20 NOTE — Telephone Encounter (Signed)
I spoke to patient's daughter, Glorious Peach, today.  They met with Dr. Earlie Server who recommended pursuing palliative care/hospice care.  They wish to pursue hospice care at home if possible and would like me to arrange this for them.  Additionally, she asks if the palliative radiation therapy may be arranged at [facility].  I will reach out to Dr. Earlie Server to see if this is possible.  I have encouraged her to contact me with any questions or concerns and I will work to help the patient and their family through this difficult time.  Camdyn Laden M. Lajuana Ripple, [facility]

## 2018-05-21 ENCOUNTER — Telehealth: Payer: Self-pay | Admitting: *Deleted

## 2018-05-21 NOTE — Telephone Encounter (Signed)
Yes. That change is fine.

## 2018-05-21 NOTE — Telephone Encounter (Signed)
Attempted call to daughter, Gerhard Perches. No answer. Left vm message for her to call back 2 [phone] and ask for Dr. Worthy Flank nurse.

## 2018-05-21 NOTE — Telephone Encounter (Signed)
Ron at [facility] aware ok to change to Fentanyl patch

## 2018-05-21 NOTE — Telephone Encounter (Signed)
[name] does not have Rad Onc. Only [facility]. [name] has Rad Onc but does not belong to [facility]. Thank you. [name]

## 2018-05-21 NOTE — Telephone Encounter (Signed)
TC from [place] w/ [facility] He went out and admitted patient to Hospice services Pt is Hydrocodone-Acetaminophen which is listed as an intolerance, but she is having itching and it is not really helping Could this possibly be changed to Fentanyl 25 mg patch Please advise

## 2018-05-22 ENCOUNTER — Telehealth: Payer: Self-pay

## 2018-05-22 NOTE — Telephone Encounter (Signed)
Contacted pt's daughter to clarify that Radiation Oncology services are not offered at [facility]. Appt date and time given to daughter. 05/29/18 1330 nurse eval, 1400 consult. Daughter verbalized understanding and agreement. Loma Sousa, RN BSN

## 2018-05-23 ENCOUNTER — Telehealth: Payer: Self-pay | Admitting: *Deleted

## 2018-05-23 MED ORDER — ONDANSETRON 4 MG PO TBDP: 4.0000 mg | ORAL_TABLET | Freq: Three times a day (TID) | ORAL | 2 refills | Status: AC | PRN

## 2018-05-23 MED ORDER — ONDANSETRON 4 MG PO TBDP: 4.0000 mg | ORAL_TABLET | Freq: Three times a day (TID) | ORAL | 0 refills | Status: DC | PRN

## 2018-05-23 NOTE — Telephone Encounter (Signed)
Aware medication has been sent

## 2018-05-23 NOTE — Telephone Encounter (Signed)
I sent some Zofran for the patient

## 2018-05-23 NOTE — Telephone Encounter (Signed)
[facility] nurse called after visit with pt Pt is complaining with intermittent nausea and vomitting Requesting RX for Zofran be sent to [facility] Please advise

## 2018-05-29 ENCOUNTER — Ambulatory Visit: Admission: RE | Admit: 2018-05-29 | Payer: Medicare Other | Source: Ambulatory Visit | Admitting: Radiation Oncology

## 2018-05-29 ENCOUNTER — Telehealth: Payer: Self-pay | Admitting: *Deleted

## 2018-05-29 ENCOUNTER — Ambulatory Visit: Payer: Medicare Other

## 2018-05-29 NOTE — Telephone Encounter (Signed)
Patient called to cancel consult for today due to not feeling well. She will call back to reschedule when she is  feeling better.

## 2018-05-30 NOTE — Telephone Encounter (Signed)
Aware. 

## 2018-06-05 ENCOUNTER — Telehealth: Payer: Self-pay | Admitting: Radiation Oncology

## 2018-06-05 ENCOUNTER — Ambulatory Visit
Admission: RE | Admit: 2018-06-05 | Discharge: 2018-06-05 | Disposition: A | Discharge status: Home / Self Care | Payer: Medicare Other | Source: Ambulatory Visit | Attending: Radiation Oncology | Admitting: Radiation Oncology

## 2018-06-05 ENCOUNTER — Ambulatory Visit: Payer: Medicare Other

## 2018-06-05 NOTE — Telephone Encounter (Signed)
Patient daughter, Glorious Peach, states that patient will not be coming at all. Does not want to be rescheduled. Does not feel like coming.

## 2018-06-19 ENCOUNTER — Encounter: Payer: Self-pay | Admitting: Internal Medicine

## 2018-07-10 DEATH — deceased
